# Patient Record
Sex: Female | Born: 1975 | Race: White | Hispanic: No | Marital: Married | Smoking: Never smoker
Health system: Southern US, Community
[De-identification: ages and names within clinical notes are randomized; demographics above are authoritative.]

## PROBLEM LIST (undated history)

## (undated) DIAGNOSIS — R51 Headache: Secondary | ICD-10-CM

## (undated) DIAGNOSIS — F419 Anxiety disorder, unspecified: Secondary | ICD-10-CM

## (undated) DIAGNOSIS — C44310 Basal cell carcinoma of skin of unspecified parts of face: Secondary | ICD-10-CM

## (undated) DIAGNOSIS — G510 Bell's palsy: Secondary | ICD-10-CM

## (undated) DIAGNOSIS — F32A Depression, unspecified: Secondary | ICD-10-CM

## (undated) DIAGNOSIS — Z8619 Personal history of other infectious and parasitic diseases: Secondary | ICD-10-CM

## (undated) DIAGNOSIS — F329 Major depressive disorder, single episode, unspecified: Secondary | ICD-10-CM

## (undated) DIAGNOSIS — F319 Bipolar disorder, unspecified: Secondary | ICD-10-CM

## (undated) DIAGNOSIS — R519 Headache, unspecified: Secondary | ICD-10-CM

## (undated) HISTORY — DX: Major depressive disorder, single episode, unspecified: F32.9

## (undated) HISTORY — PX: OTHER SURGICAL HISTORY: SHX169

## (undated) HISTORY — DX: Personal history of other infectious and parasitic diseases: Z86.19

## (undated) HISTORY — PX: TONSILLECTOMY AND ADENOIDECTOMY: SUR1326

## (undated) HISTORY — PX: MOHS SURGERY: SUR867

## (undated) HISTORY — DX: Basal cell carcinoma of skin of unspecified parts of face: C44.310

## (undated) HISTORY — PX: TUBAL LIGATION: SHX77

## (undated) HISTORY — DX: Depression, unspecified: F32.A

---

## 2005-07-06 ENCOUNTER — Ambulatory Visit: Payer: Self-pay | Admitting: Internal Medicine

## 2006-04-08 ENCOUNTER — Inpatient Hospital Stay: Payer: Self-pay

## 2009-10-15 ENCOUNTER — Ambulatory Visit: Payer: Self-pay | Admitting: Licensed Clinical Social Worker

## 2012-01-31 HISTORY — PX: BREAST CYST ASPIRATION: SHX578

## 2012-04-09 ENCOUNTER — Ambulatory Visit: Payer: Self-pay | Admitting: Obstetrics and Gynecology

## 2012-11-13 ENCOUNTER — Ambulatory Visit
Admission: RE | Admit: 2012-11-13 | Discharge: 2012-11-13 | Disposition: A | Discharge status: Home / Self Care | Payer: BC Managed Care – PPO | Source: Ambulatory Visit | Attending: Nurse Practitioner | Admitting: Nurse Practitioner

## 2012-11-13 ENCOUNTER — Other Ambulatory Visit: Payer: Self-pay | Admitting: Nurse Practitioner

## 2012-11-13 DIAGNOSIS — E041 Nontoxic single thyroid nodule: Secondary | ICD-10-CM

## 2013-04-18 ENCOUNTER — Ambulatory Visit: Payer: Self-pay | Admitting: Surgery

## 2014-04-28 DIAGNOSIS — D249 Benign neoplasm of unspecified breast: Secondary | ICD-10-CM | POA: Insufficient documentation

## 2014-05-11 ENCOUNTER — Encounter: Payer: Self-pay | Admitting: Podiatry

## 2014-05-11 ENCOUNTER — Ambulatory Visit: Payer: Self-pay | Admitting: Podiatry

## 2014-05-11 ENCOUNTER — Ambulatory Visit: Payer: Self-pay

## 2014-05-11 ENCOUNTER — Ambulatory Visit (INDEPENDENT_AMBULATORY_CARE_PROVIDER_SITE_OTHER): Payer: BC Managed Care – PPO | Admitting: Podiatry

## 2014-05-11 VITALS — BP 109/77 | HR 84 | Resp 16

## 2014-05-11 DIAGNOSIS — F32A Depression, unspecified: Secondary | ICD-10-CM | POA: Insufficient documentation

## 2014-05-11 DIAGNOSIS — M722 Plantar fascial fibromatosis: Secondary | ICD-10-CM | POA: Diagnosis not present

## 2014-05-11 DIAGNOSIS — F329 Major depressive disorder, single episode, unspecified: Secondary | ICD-10-CM | POA: Insufficient documentation

## 2014-05-11 DIAGNOSIS — C4490 Unspecified malignant neoplasm of skin, unspecified: Secondary | ICD-10-CM | POA: Insufficient documentation

## 2014-05-11 DIAGNOSIS — E669 Obesity, unspecified: Secondary | ICD-10-CM | POA: Insufficient documentation

## 2014-05-11 DIAGNOSIS — F17200 Nicotine dependence, unspecified, uncomplicated: Secondary | ICD-10-CM | POA: Insufficient documentation

## 2014-05-11 DIAGNOSIS — G4700 Insomnia, unspecified: Secondary | ICD-10-CM | POA: Insufficient documentation

## 2014-05-11 MED ORDER — TRIAMCINOLONE ACETONIDE 10 MG/ML IJ SUSP: 10.0000 mg | Freq: Once | INTRAMUSCULAR | Status: DC

## 2014-05-11 MED ORDER — MELOXICAM 7.5 MG PO TABS: 7.5000 mg | ORAL_TABLET | Freq: Every day | ORAL | Status: DC

## 2014-05-11 NOTE — Patient Instructions (Signed)
Plantar Fasciitis (Heel Spur Syndrome) with Rehab The plantar fascia is a fibrous, ligament-like, soft-tissue structure that spans the bottom of the foot. Plantar fasciitis is a condition that causes pain in the foot due to inflammation of the tissue. SYMPTOMS   Pain and tenderness on the underneath side of the foot.  Pain that worsens with standing or walking. CAUSES  Plantar fasciitis is caused by irritation and injury to the plantar fascia on the underneath side of the foot. Common mechanisms of injury include:  Direct trauma to bottom of the foot.  Damage to a small nerve that runs under the foot where the main fascia attaches to the heel bone.  Stress placed on the plantar fascia due to bone spurs. RISK INCREASES WITH:   Activities that place stress on the plantar fascia (running, jumping, pivoting, or cutting).  Poor strength and flexibility.  Improperly fitted shoes.  Tight calf muscles.  Flat feet.  Failure to warm-up properly before activity.  Obesity. PREVENTION  Warm up and stretch properly before activity.  Allow for adequate recovery between workouts.  Maintain physical fitness:  Strength, flexibility, and endurance.  Cardiovascular fitness.  Maintain a health body weight.  Avoid stress on the plantar fascia.  Wear properly fitted shoes, including arch supports for individuals who have flat feet. PROGNOSIS  If treated properly, then the symptoms of plantar fasciitis usually resolve without surgery. However, occasionally surgery is necessary. RELATED COMPLICATIONS   Recurrent symptoms that may result in a chronic condition.  Problems of the lower back that are caused by compensating for the injury, such as limping.  Pain or weakness of the foot during push-off following surgery.  Chronic inflammation, scarring, and partial or complete fascia tear, occurring more often from repeated injections. TREATMENT  Treatment initially involves the use of  ice and medication to help reduce pain and inflammation. The use of strengthening and stretching exercises may help reduce pain with activity, especially stretches of the Achilles tendon. These exercises may be performed at home or with a therapist. Your caregiver may recommend that you use heel cups of arch supports to help reduce stress on the plantar fascia. Occasionally, corticosteroid injections are given to reduce inflammation. If symptoms persist for greater than 6 months despite non-surgical (conservative), then surgery may be recommended.  MEDICATION   If pain medication is necessary, then nonsteroidal anti-inflammatory medications, such as aspirin and ibuprofen, or other minor pain relievers, such as acetaminophen, are often recommended.  Do not take pain medication within 7 days before surgery.  Prescription pain relievers may be given if deemed necessary by your caregiver. Use only as directed and only as much as you need.  Corticosteroid injections may be given by your caregiver. These injections should be reserved for the most serious cases, because they may only be given a certain number of times. HEAT AND COLD  Cold treatment (icing) relieves pain and reduces inflammation. Cold treatment should be applied for 10 to 15 minutes every 2 to 3 hours for inflammation and pain and immediately after any activity that aggravates your symptoms. Use ice packs or massage the area with a piece of ice (ice massage).  Heat treatment may be used prior to performing the stretching and strengthening activities prescribed by your caregiver, physical therapist, or athletic trainer. Use a heat pack or soak the injury in warm water. SEEK IMMEDIATE MEDICAL CARE IF:  Treatment seems to offer no benefit, or the condition worsens.  Any medications produce adverse side effects. EXERCISES RANGE   OF MOTION (ROM) AND STRETCHING EXERCISES - Plantar Fasciitis (Heel Spur Syndrome) These exercises may help you  when beginning to rehabilitate your injury. Your symptoms may resolve with or without further involvement from your physician, physical therapist or athletic trainer. While completing these exercises, remember:   Restoring tissue flexibility helps normal motion to return to the joints. This allows healthier, less painful movement and activity.  An effective stretch should be held for at least 30 seconds.  A stretch should never be painful. You should only feel a gentle lengthening or release in the stretched tissue. RANGE OF MOTION - Toe Extension, Flexion  Sit with your right / left leg crossed over your opposite knee.  Grasp your toes and gently pull them back toward the top of your foot. You should feel a stretch on the bottom of your toes and/or foot.  Hold this stretch for __________ seconds.  Now, gently pull your toes toward the bottom of your foot. You should feel a stretch on the top of your toes and or foot.  Hold this stretch for __________ seconds. Repeat __________ times. Complete this stretch __________ times per day.  RANGE OF MOTION - Ankle Dorsiflexion, Active Assisted  Remove shoes and sit on a chair that is preferably not on a carpeted surface.  Place right / left foot under knee. Extend your opposite leg for support.  Keeping your heel down, slide your right / left foot back toward the chair until you feel a stretch at your ankle or calf. If you do not feel a stretch, slide your bottom forward to the edge of the chair, while still keeping your heel down.  Hold this stretch for __________ seconds. Repeat __________ times. Complete this stretch __________ times per day.  STRETCH - Gastroc, Standing  Place hands on wall.  Extend right / left leg, keeping the front knee somewhat bent.  Slightly point your toes inward on your back foot.  Keeping your right / left heel on the floor and your knee straight, shift your weight toward the wall, not allowing your back to  arch.  You should feel a gentle stretch in the right / left calf. Hold this position for __________ seconds. Repeat __________ times. Complete this stretch __________ times per day. STRETCH - Soleus, Standing  Place hands on wall.  Extend right / left leg, keeping the other knee somewhat bent.  Slightly point your toes inward on your back foot.  Keep your right / left heel on the floor, bend your back knee, and slightly shift your weight over the back leg so that you feel a gentle stretch deep in your back calf.  Hold this position for __________ seconds. Repeat __________ times. Complete this stretch __________ times per day. STRETCH - Gastrocsoleus, Standing  Note: This exercise can place a lot of stress on your foot and ankle. Please complete this exercise only if specifically instructed by your caregiver.   Place the ball of your right / left foot on a step, keeping your other foot firmly on the same step.  Hold on to the wall or a rail for balance.  Slowly lift your other foot, allowing your body weight to press your heel down over the edge of the step.  You should feel a stretch in your right / left calf.  Hold this position for __________ seconds.  Repeat this exercise with a slight bend in your right / left knee. Repeat __________ times. Complete this stretch __________ times per day.    STRENGTHENING EXERCISES - Plantar Fasciitis (Heel Spur Syndrome)  These exercises may help you when beginning to rehabilitate your injury. They may resolve your symptoms with or without further involvement from your physician, physical therapist or athletic trainer. While completing these exercises, remember:   Muscles can gain both the endurance and the strength needed for everyday activities through controlled exercises.  Complete these exercises as instructed by your physician, physical therapist or athletic trainer. Progress the resistance and repetitions only as guided. STRENGTH -  Towel Curls  Sit in a chair positioned on a non-carpeted surface.  Place your foot on a towel, keeping your heel on the floor.  Pull the towel toward your heel by only curling your toes. Keep your heel on the floor.  If instructed by your physician, physical therapist or athletic trainer, add ____________________ at the end of the towel. Repeat __________ times. Complete this exercise __________ times per day. STRENGTH - Ankle Inversion  Secure one end of a rubber exercise band/tubing to a fixed object (table, pole). Loop the other end around your foot just before your toes.  Place your fists between your knees. This will focus your strengthening at your ankle.  Slowly, pull your big toe up and in, making sure the band/tubing is positioned to resist the entire motion.  Hold this position for __________ seconds.  Have your muscles resist the band/tubing as it slowly pulls your foot back to the starting position. Repeat __________ times. Complete this exercises __________ times per day.  Document Released: 01/16/2005 Document Revised: 04/10/2011 Document Reviewed: 04/30/2008 [employer] Patient Information 2015 [employer]. This information is not intended to replace advice given to you by your health care provider. Make sure you discuss any questions you have with your health care provider.  

## 2014-05-12 NOTE — Progress Notes (Signed)
Subjective:     Patient ID: Brittany Davila, female   DOB: 10-Apr-1975, 39 y.o.   MRN: [id number]  HPI 39 year old female presents the office they with complaints of left arch and heel pain which has been ongoing for several months. She denies any history of injury or trauma to the area or any change or increase in activity the time of onset of symptoms. She denies any numbness or tingling. Denies any swelling or redness overlying the area. She states that she has pain in the morning or after periods of rest. She said no prior treatment. No other complaints at this time.  Review of Systems  All other systems reviewed and are negative.      Objective:   Physical Exam AAO x3, NAD DP/PT pulses palpable bilaterally, CRT less than 3 seconds Protective sensation intact with Simms Weinstein monofilament, vibratory sensation intact, Achilles tendon reflex intact Tenderness to palpation overlying the plantar medial tubercle of the calcaneus to left heel at the insertion of the plantar fascia. There is mild pain along the course of the medial band of the plantar fascia within the arch of the foot. The plantar fascia appears intact. There is no pain with lateral compression of the calcaneus or pain the vibratory sensation. No pain on the posterior aspect of the calcaneus or along the course/insertion of the Achilles tendon. There is no overlying edema, erythema, increase in warmth. No other areas of tenderness palpation or pain with vibratory sensation to the foot/ankle. MMT 5/5, ROM WNL No open lesions or pre-ulcerative lesions are identified. No pain with calf compression, swelling, warmth, erythema.     Assessment:     39 year old female with left foot plantar fasciitis    Plan:     -X-rays were obtained and reviewed with the patient -Treatment options were discussed including alternatives, risks, complications -Patient elects to proceed with steroid injection into the left heel. Under sterile skin  preparation, a total of 2.5cc of kenalog 10, 0.5% Marcaine plain, and 2% lidocaine plain were infiltrated into the symptomatic area without complication. A band-aid was applied. Patient tolerated the injection well without complication. Post-injection care with discussed with the patient. Discussed with the patient to ice the area over the next couple of days to help prevent a steroid flare.  -Prescribed meloxicam. Discussed side effects and directed to stop if any are to occur call the office -Dispensed plantar fascial brace -Ice to the area -Discussed stretching exercises -Discussed shoe gear modifications and possible orthotics. -Follow-up in 3 weeks or sooner if any problems are to arise. In the meantime encouraged to call the office with any questions or concerns or any change in symptoms.

## 2014-06-04 ENCOUNTER — Encounter: Payer: Self-pay | Admitting: Podiatry

## 2014-06-04 ENCOUNTER — Ambulatory Visit (INDEPENDENT_AMBULATORY_CARE_PROVIDER_SITE_OTHER): Payer: BC Managed Care – PPO | Admitting: Podiatry

## 2014-06-04 VITALS — Ht 69.0 in | Wt 245.0 lb

## 2014-06-04 DIAGNOSIS — M722 Plantar fascial fibromatosis: Secondary | ICD-10-CM

## 2014-06-05 NOTE — Progress Notes (Signed)
Patient ID: Brittany Davila, female   DOB: 1975/09/14, 39 y.o.   MRN: [id number]  Subjective: 39 year old female presents the office they for follow-up evaluation of left arch and heel pain. She states that the injection is also limited help significantly her pain is decreased the left heel. She is also continue with icing and stretching activities as well as when the plantar fascial brace. 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 She does not have the sensation on a regular basis or with regular activities only after standing for long periods of time. Denies any history of injury or trauma. No other complaints at this time.  Objective: AAO x3, NAD DP/PT pulses palpable bilaterally, CRT less than 3 seconds Protective sensation intact with Simms Weinstein monofilament, vibratory sensation intact, Achilles tendon reflex intact; negative tinel sign  There is decreased tenderness to palpation overlying the plantar medial tubercle of the calcaneus to left heel at the insertion of the plantar fascia. There is no pain along the course of plantar fascial within the arch of the foot and the plantar fascia appears intact. There is no pain with lateral compression of the calcaneus or pain the vibratory sensation. No pain on the posterior aspect of the calcaneus or along the course/insertion of the Achilles tendon. There is no overlying edema, erythema, increase in warmth. No other areas of tenderness palpation or pain with vibratory sensation to the foot/ankle. MMT 5/5, ROM WNL No open lesions or pre-ulcerative lesions are identified. No pain with calf compression, swelling, warmth, erythema.  Assessment: 39 year old female with resolving left heel pain, plantar fasciitis  Plan: -Treatment options were discussed including alternatives, risks, complications. -Patient wishes to hold off on another steroid  injection at this time. -I discussed with patient I do believe that she would benefit from orthotics to help support her foot type. I believe this would help her while working to help alleviate the symptoms. I discussed custom and over-the-counter orthotics. She'll purchase an over-the-counter orthotics and I talked her about what to look for in purchasing them. -Continue the icing and stretching activities -Continue plantar fascial brace -Continue night splint -Discussed shoe gear modifications and not to go barefoot even while at home. She presents today wearing a flat sandal. She does like to wear sandals and I discussed the various brands of sandals that have good arch support.  -Follow-up in 4 weeks' if the symptoms are not completely resolved or sooner if any problems are to arise. In the meantime call the office if any questions, concerns, change in symptoms.

## 2014-09-16 ENCOUNTER — Encounter: Payer: Self-pay | Admitting: Podiatry

## 2014-09-16 ENCOUNTER — Ambulatory Visit (INDEPENDENT_AMBULATORY_CARE_PROVIDER_SITE_OTHER): Payer: BC Managed Care – PPO

## 2014-09-16 ENCOUNTER — Ambulatory Visit (INDEPENDENT_AMBULATORY_CARE_PROVIDER_SITE_OTHER): Payer: BC Managed Care – PPO | Admitting: Podiatry

## 2014-09-16 DIAGNOSIS — S99921A Unspecified injury of right foot, initial encounter: Secondary | ICD-10-CM

## 2014-09-16 DIAGNOSIS — S9031XA Contusion of right foot, initial encounter: Secondary | ICD-10-CM

## 2014-09-16 MED ORDER — MELOXICAM 15 MG PO TABS: 15.0000 mg | ORAL_TABLET | Freq: Every day | ORAL | Status: DC

## 2014-09-16 NOTE — Progress Notes (Signed)
She presents today with a chief complaint of a painful injury to the right foot following a water slide over the weekend. She states is very sore to walk on it has improved a little bit.  Objective: Vital signs are stable alert and oriented 3. Pulses are strongly palpable. Neurologic sensorium is intact versus once the monofilament. Deep tendon reflexes are intact. Muscle strength is 5 over 5 dorsiflexes plantar flexors and inverters everters all intrinsic musculature is intact. She has pain on direct palpation of the navicular tuberosity and all other or the PD evaluations appear to be relatively normal. Radiographs taken in the office today do not demonstrate any type of osseus abnormalities.  Assessment: Contusion navicular tuberosity right foot.  Plan: Discussed etiology pathology conservative versus surgical therapies discussed R ICE therapy and will follow-up with her as needed. Also instructed her to wear her Cam Gilford Rile that she has for the other foot on the right foot.  Dr. Roselind Messier

## 2014-10-07 ENCOUNTER — Ambulatory Visit: Payer: BC Managed Care – PPO | Admitting: Podiatry

## 2016-03-09 ENCOUNTER — Emergency Department (HOSPITAL_COMMUNITY)
Admission: EM | Admit: 2016-03-09 | Discharge: 2016-03-09 | Disposition: A | Discharge status: Home / Self Care | Payer: BC Managed Care – PPO | Attending: Emergency Medicine | Admitting: Emergency Medicine

## 2016-03-09 ENCOUNTER — Encounter (HOSPITAL_COMMUNITY): Payer: Self-pay | Admitting: Emergency Medicine

## 2016-03-09 DIAGNOSIS — Z79899 Other long term (current) drug therapy: Secondary | ICD-10-CM | POA: Diagnosis not present

## 2016-03-09 DIAGNOSIS — G4700 Insomnia, unspecified: Secondary | ICD-10-CM | POA: Diagnosis not present

## 2016-03-09 DIAGNOSIS — F418 Other specified anxiety disorders: Secondary | ICD-10-CM | POA: Diagnosis present

## 2016-03-09 HISTORY — DX: Anxiety disorder, unspecified: F41.9

## 2016-03-09 HISTORY — DX: Bipolar disorder, unspecified: F31.9

## 2016-03-09 LAB — CBC
HEMATOCRIT: 40.8 % (ref 36.0–46.0)
HEMOGLOBIN: 13.9 g/dL (ref 12.0–15.0)
MCH: 30.2 pg (ref 26.0–34.0)
MCHC: 34.1 g/dL (ref 30.0–36.0)
MCV: 88.7 fL (ref 78.0–100.0)
Platelets: 326 10*3/uL (ref 150–400)
RBC: 4.6 MIL/uL (ref 3.87–5.11)
RDW: 12.8 % (ref 11.5–15.5)
WBC: 9.2 10*3/uL (ref 4.0–10.5)

## 2016-03-09 LAB — COMPREHENSIVE METABOLIC PANEL
ALBUMIN: 4.5 g/dL (ref 3.5–5.0)
ALK PHOS: 65 U/L (ref 38–126)
ALT: 13 U/L — AB (ref 14–54)
AST: 16 U/L (ref 15–41)
Anion gap: 7 (ref 5–15)
BUN: 10 mg/dL (ref 6–20)
CALCIUM: 9.2 mg/dL (ref 8.9–10.3)
CO2: 26 mmol/L (ref 22–32)
CREATININE: 0.95 mg/dL (ref 0.44–1.00)
Chloride: 106 mmol/L (ref 101–111)
GFR calc Af Amer: >60 mL/min (ref >60)
GFR calc non Af Amer: >60 mL/min (ref >60)
GLUCOSE: 97 mg/dL (ref 65–99)
Potassium: 4 mmol/L (ref 3.5–5.1)
SODIUM: 139 mmol/L (ref 135–145)
Total Bilirubin: 0.1 mg/dL — ABNORMAL LOW (ref 0.3–1.2)
Total Protein: 7.3 g/dL (ref 6.5–8.1)

## 2016-03-09 LAB — RAPID URINE DRUG SCREEN, HOSP PERFORMED
Amphetamines: NOT DETECTED
BARBITURATES: NOT DETECTED
Benzodiazepines: POSITIVE — AB
COCAINE: NOT DETECTED
Opiates: NOT DETECTED
TETRAHYDROCANNABINOL: NOT DETECTED

## 2016-03-09 LAB — ETHANOL: Alcohol, Ethyl (B): <5 mg/dL (ref <5)

## 2016-03-09 LAB — ACETAMINOPHEN LEVEL: Acetaminophen (Tylenol), Serum: <10 ug/mL — ABNORMAL LOW (ref 10–30)

## 2016-03-09 LAB — SALICYLATE LEVEL: Salicylate Lvl: <7 mg/dL (ref 2.8–30.0)

## 2016-03-09 MED ORDER — TRAZODONE HCL 100 MG PO TABS: 100.0000 mg | ORAL_TABLET | Freq: Every day | ORAL | 0 refills | Status: DC

## 2016-03-09 NOTE — ED Notes (Signed)
Patient's mother at bedside.

## 2016-03-09 NOTE — ED Provider Notes (Signed)
[facility] DEPT Provider Note   CSN: [id number] Arrival date & time: 03/09/16  1035     History   Chief Complaint Chief Complaint  Patient presents with  . Anxiety Depression    HPI Brittany Davila is a 41 y.o. female.  HPI:  And is 89. She has a long-standing history of anxiety depression and bipolar disorder. She follows with a psychiatrist and his nurse practitioner.  He states that this is the third psychiatrist she's had the last 4 years because one retired, then one moved away. She's been with her current one for 6 months.  Her main complaint is insomnia. She states over the last year she's been tried on multiple medications. She was on Ambien which worked for a while then stopped. Same effect with Ambien CR, Lunesta, and all over-the-counter medications.  She was placed on trazodone about a month ago. She took a 100 mg dose and it did not work. She took 200 mg. She states several hours later she was on the floor. She get a verbal happened. She stated her husband had trouble waking her up. She was concerned about this and stopped taking it.  Current medications include Lamictal, Latuda, and BuSpar. She uses Mirena IUD. Xanax 0.5 twice a day when necessary for anxiety.  She states that she became just frustrated last night because she could not sleep and took 4 of her Xanax 1 mg tablets. She called her nurse practitioner at her psychiatrist office today and reported this. She states that she was directed here because they were "concerned". Patient denies that she is suicidal. She admits frustration. Patient's mother is here with her and supportive. Both denied that she is suicidal or has any plan or intent of self-harm right now.      Past Medical History:  Diagnosis Date  . Anxiety   . Bipolar 1 disorder ([place])   . Depression     Patient Active Problem List   Diagnosis Date Noted  . Clinical depression 05/11/2014  . Cannot sleep 05/11/2014  . Adiposity 05/11/2014  .  CA of skin 05/11/2014  . 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 BREAST CYST ASPIRATION      OB History    No data available       Home Medications    Prior to Admission medications   Medication Sig Start Date End Date Taking? Authorizing Provider  ALPRAZolam (XANAX) 1 MG tablet TAKE 1/2 -1 TABLET BY MOUTH TWICE A DAY. 07/15/14  Yes Historical Provider, MD  busPIRone (BUSPAR) 10 MG tablet Take 10 mg by mouth at bedtime.   Yes Historical Provider, MD  lamoTRIgine (LAMICTAL) 200 MG tablet Take 200 mg by mouth 2 (two) times daily.    Yes Historical Provider, MD  levonorgestrel (MIRENA) 20 MCG/24HR IUD 1 each by Intrauterine route once.   Yes Historical Provider, MD  lurasidone (LATUDA) 80 MG TABS tablet Take 40 mg by mouth 2 (two) times daily.   Yes Historical Provider, MD  meloxicam (MOBIC) 15 MG tablet Take 1 tablet (15 mg total) by mouth daily. Patient not taking: Reported on 03/09/2016 09/16/14   Max T Hyatt, DPM  meloxicam (MOBIC) 7.5 MG tablet Take 1 tablet (7.5 mg total) by mouth daily. Patient not taking: Reported on 03/09/2016 05/11/14   Trula Slade, DPM  naproxen (NAPROSYN) 500 MG tablet TAKE 1 TABLET (500 MG TOTAL) BY MOUTH 2 (  TWO) TIMES DAILY AS NEEDED. 08/21/14   Historical Provider, MD  traZODone (DESYREL) 100 MG tablet Take 1 tablet (100 mg total) by mouth at bedtime. 03/09/16   Tanna Furry, MD  zolpidem (AMBIEN CR) 12.5 MG CR tablet Take 12.5 mg by mouth at bedtime. 08/31/14   Historical Provider, MD    Family History No family history on file.  Social History Social History  Substance Use Topics  . Smoking status: Never Smoker  . Smokeless tobacco: Never Used  . Alcohol use No     Allergies   Patient has no known allergies.   Review of Systems Review of Systems  Constitutional: Negative for appetite change, chills, diaphoresis, fatigue and fever.  HENT: Negative  for mouth sores, sore throat and trouble swallowing.   Eyes: Negative for visual disturbance.  Respiratory: Negative for cough, chest tightness, shortness of breath and wheezing.   Cardiovascular: Negative for chest pain.  Gastrointestinal: Negative for abdominal distention, abdominal pain, diarrhea, nausea and vomiting.  Endocrine: Negative for polydipsia, polyphagia and polyuria.  Genitourinary: Negative for dysuria, frequency and hematuria.  Musculoskeletal: Negative for gait problem.  Skin: Negative for color change, pallor and rash.  Neurological: Negative for dizziness, syncope, light-headedness and headaches.  Hematological: Does not bruise/bleed easily.  Psychiatric/Behavioral: Positive for dysphoric mood and sleep disturbance. Negative for behavioral problems and confusion.     Physical Exam Updated Vital Signs BP 124/81 (BP Location: Right Arm)   Pulse 87   Temp 98.1 F (36.7 C)   Resp 20   Ht 5\' 10"  (1.778 m)   Wt 216 lb (98 kg)   SpO2 99%   BMI 30.99 kg/m   Physical Exam  Constitutional: She is oriented to person, place, and time. She appears well-developed and well-nourished. No distress.  HENT:  Head: Normocephalic.  Eyes: Conjunctivae are normal. Pupils are equal, round, and reactive to light. No scleral icterus.  Neck: Normal range of motion. Neck supple. No thyromegaly present.  Cardiovascular: Normal rate and regular rhythm.  Exam reveals no gallop and no friction rub.   No murmur heard. Pulmonary/Chest: Effort normal and breath sounds normal. No respiratory distress. She has no wheezes. She has no rales.  Abdominal: Soft. Bowel sounds are normal. She exhibits no distension. There is no tenderness. There is no rebound.  Musculoskeletal: Normal range of motion.  Neurological: She is alert and oriented to person, place, and time.  Skin: Skin is warm and dry. No rash noted.  Psychiatric: She has a normal mood and affect. Her behavior is normal.  Normal  interaction. Patient is awake alert. Pleasant and interactive. Answers questions appropriately. Denies any suicidal intent with her ingestion last night or any suicidal thoughts or intent currently.     ED Treatments / Results  Labs (all labs ordered are listed, but only abnormal results are displayed) Labs Reviewed  COMPREHENSIVE METABOLIC PANEL - Abnormal; Notable for the following:       Result Value   ALT 13 (*)    Total Bilirubin 0.1 (*)    All other components within normal limits  ACETAMINOPHEN LEVEL - Abnormal; Notable for the following:    Acetaminophen (Tylenol), Serum <10 (*)    All other components within normal limits  RAPID URINE DRUG SCREEN, HOSP PERFORMED - Abnormal; Notable for the following:    Benzodiazepines POSITIVE (*)    All other components within normal limits  ETHANOL  SALICYLATE LEVEL  CBC    EKG  EKG Interpretation None  Radiology No results found.  Procedures Procedures (including critical care time)  Medications Ordered in ED Medications - No data to display   Initial Impression / Assessment and Plan / ED Course  I have reviewed the triage vital signs and the nursing notes.  Pertinent labs & imaging results that were available during my care of the patient were reviewed by me and considered in my medical decision making (see chart for details).     I discussed the case with the in PE for psychiatry on call for [place] today. She agreed with my plan of having the patient reinstitute 100 mg of trazodone at night. Thus the patient is started with trouble getting into bed, turning off all lites and TV and music and stimuli. After 5-7 days if not getting rest with a 100 mg dose may take a second dose of 50 mg at 30 minutes. Vascular to follow-up with her psychiatrist and nurse practitioner within the next 7 days.  Told her return here with any change in thoughts or behavior or any suicidal thoughts or plans. She and mother both are able to  very easily contract for safety. I feel comfortable with this patient being discharged with the above plan.  Final Clinical Impressions(s) / ED Diagnoses   Final diagnoses:  Insomnia, unspecified type    New Prescriptions New Prescriptions   TRAZODONE (DESYREL) 100 MG TABLET    Take 1 tablet (100 mg total) by mouth at bedtime.     Tanna Furry, MD 03/09/16 1350

## 2016-03-09 NOTE — Discharge Instructions (Signed)
Follow up with your Psychiatrist/Nurse Practitioner.  100 mg trazodone at night.   Start a "Ritual" at bedtime. In bed,lights/TV/Music off.  After 5-7 days, if Trazodone is not working, you may repeat 50mg  after 30 minutes.

## 2016-03-09 NOTE — ED Triage Notes (Signed)
Patient from home reports a history of anxiety and bipolar.  She has a history of insomnia and the xanax she is prescribed was not helping her sleep so she took 4.  She normally needs to take 2.  She sees a Designer, jewellery at [facility], [place] who suggested she come in.  Patient has suicidal ideation but no plan.  Denies HI.

## 2016-05-12 ENCOUNTER — Other Ambulatory Visit: Payer: Self-pay | Admitting: Surgery

## 2016-05-12 DIAGNOSIS — D242 Benign neoplasm of left breast: Secondary | ICD-10-CM

## 2016-05-15 ENCOUNTER — Encounter: Payer: Self-pay | Admitting: Internal Medicine

## 2016-05-15 ENCOUNTER — Ambulatory Visit (INDEPENDENT_AMBULATORY_CARE_PROVIDER_SITE_OTHER): Payer: BC Managed Care – PPO | Admitting: Internal Medicine

## 2016-05-15 DIAGNOSIS — F419 Anxiety disorder, unspecified: Secondary | ICD-10-CM | POA: Diagnosis not present

## 2016-05-15 DIAGNOSIS — F319 Bipolar disorder, unspecified: Secondary | ICD-10-CM | POA: Diagnosis not present

## 2016-05-15 DIAGNOSIS — C44310 Basal cell carcinoma of skin of unspecified parts of face: Secondary | ICD-10-CM | POA: Insufficient documentation

## 2016-05-15 DIAGNOSIS — F5104 Psychophysiologic insomnia: Secondary | ICD-10-CM

## 2016-05-15 DIAGNOSIS — F329 Major depressive disorder, single episode, unspecified: Secondary | ICD-10-CM

## 2016-05-15 DIAGNOSIS — G4700 Insomnia, unspecified: Secondary | ICD-10-CM | POA: Insufficient documentation

## 2016-05-15 NOTE — Assessment & Plan Note (Signed)
She will continue Buspar and Xanax She will continue to follow with psychiatry

## 2016-05-15 NOTE — Progress Notes (Signed)
HPI  Pt presents to the clinic today to establish care and for management of the conditions listed below. She has not had a PCP in many years.  Anxiety and Depression: She feels like this is triggered by financial struggles and feelings of inadequacy. She is taking Buspar and Xanax as prescribed. She feels like her symptoms are well controlled.   Bipolar 1 Disorder: She is taking Lamictal and Latuda as prescribed. She follows with Dr. Rosilyn Mings in [place]. She feels like her symptoms are well controlled.  Basal Cell Carcinoma of Face (4 times): s/p excision 4-5 years ago. She follows with dermatology yearly, Dr. Ledell Peoples office.  Insomnia: She reports she is getting 4-5 hours of sleep at night. She has trouble staying asleep even with the Trazadone.  Flu:  10/2015 Tetanus: > 10 years ago Pap Smear: 04/2016- abnormal, repeat in 1 year Dentist: biannually  Past Medical History:  Diagnosis Date  . Anxiety   . Basal cell carcinoma (BCC) of face   . Bipolar 1 disorder ([place])   . Depression   . History of genital warts     Current Outpatient Prescriptions  Medication Sig Dispense Refill  . ALPRAZolam (XANAX) 1 MG tablet TAKE 1/2 -1 TABLET BY MOUTH TWICE A DAY.  0  . busPIRone (BUSPAR) 10 MG tablet Take 10 mg by mouth at bedtime.    . lamoTRIgine (LAMICTAL) 200 MG tablet Take 200 mg by mouth 2 (two) times daily.     . Lurasidone HCl (LATUDA) 60 MG TABS Take 1 tablet by mouth 2 (two) times daily.    . traZODone (DESYREL) 100 MG tablet Take 1 tablet (100 mg total) by mouth at bedtime. (Patient taking differently: Take 200 mg by mouth at bedtime. ) 45 tablet 0   Current Facility-Administered Medications  Medication Dose Route Frequency Provider Last Rate Last Dose  . triamcinolone acetonide (KENALOG) 10 MG/ML injection 10 mg  10 mg Other Once Trula Slade, DPM        No Known Allergies  Family History  Problem Relation Age of Onset  . Diabetes Maternal Grandfather   . Breast cancer  Paternal Grandmother   . Colon cancer Paternal Grandfather   . Pancreatic cancer Paternal Grandfather   . Lung cancer Maternal Aunt     Social History   Social History  . Marital status: Married    Spouse name: N/A  . Number of children: N/A  . Years of education: N/A   Occupational History  . Not on file.   Social History Main Topics  . Smoking status: Never Smoker  . Smokeless tobacco: Never Used  . Alcohol use 0.0 oz/week     Comment: occasional  . Drug use: No  . Sexual activity: No   Other Topics Concern  . Not on file   Social History Narrative  . No narrative on file    ROS:  Constitutional: Denies fever, malaise, fatigue, headache or abrupt weight changes.  Respiratory: Denies difficulty breathing, shortness of breath, cough or sputum production.   Cardiovascular: Denies chest pain, chest tightness, palpitations or swelling in the hands or feet.  Skin: Denies redness, rashes, lesions or ulcercations.  Neurological: Denies dizziness, difficulty with memory, difficulty with speech or problems with balance and coordination.  Psych: Pt has history of anxiety and depression. Denies SI/HI.  No other specific complaints in a complete review of systems (except as listed in HPI above).  PE:  BP 122/74 (BP Location: Right Arm, Patient Position: Sitting,  Cuff Size: Large)   Pulse 82   Temp 98.4 F (36.9 C) (Oral)   Ht 5\' 8"  (1.727 m)   Wt 218 lb 12 oz (99.2 kg)   SpO2 98%   BMI 33.26 kg/m  Wt Readings from Last 3 Encounters:  05/15/16 218 lb 12 oz (99.2 kg)  03/09/16 216 lb (98 kg)  06/04/14 245 lb (111.1 kg)    General: Appears her stated age, obese in NAD. Skin: Dry and intact. Cardiovascular: Normal rate and rhythm. Pulmonary/Chest: Normal effort and positive vesicular breath sounds. No respiratory distress. No wheezes, rales or ronchi noted.  Neurological: Alert and oriented.  Psychiatric: Mood and affect normal. Behavior is normal. Judgment and  thought content normal.     BMET    Component Value Date/Time   NA 139 03/09/2016 1125   K 4.0 03/09/2016 1125   CL 106 03/09/2016 1125   CO2 26 03/09/2016 1125   GLUCOSE 97 03/09/2016 1125   BUN 10 03/09/2016 1125   CREATININE 0.95 03/09/2016 1125   CALCIUM 9.2 03/09/2016 1125   GFRNONAA >60 03/09/2016 1125   GFRAA >60 03/09/2016 1125    Lipid Panel  No results found for: CHOL, TRIG, HDL, CHOLHDL, VLDL, LDLCALC  CBC    Component Value Date/Time   WBC 9.2 03/09/2016 1125   RBC 4.60 03/09/2016 1125   HGB 13.9 03/09/2016 1125   HCT 40.8 03/09/2016 1125   PLT 326 03/09/2016 1125   MCV 88.7 03/09/2016 1125   MCH 30.2 03/09/2016 1125   MCHC 34.1 03/09/2016 1125   RDW 12.8 03/09/2016 1125    Hgb A1C No results found for: HGBA1C   Assessment and Plan:

## 2016-05-15 NOTE — Assessment & Plan Note (Signed)
She will continue to follow with Dr. Ledell Peoples office

## 2016-05-15 NOTE — Patient Instructions (Signed)
Insomnia Insomnia is a sleep disorder that makes it difficult to fall asleep or to stay asleep. Insomnia can cause tiredness (fatigue), low energy, difficulty concentrating, mood swings, and poor performance at work or school. There are three different ways to classify insomnia:  Difficulty falling asleep.  Difficulty staying asleep.  Waking up too early in the morning. Any type of insomnia can be long-term (chronic) or short-term (acute). Both are common. Short-term insomnia usually lasts for three months or less. Chronic insomnia occurs at least three times a week for longer than three months. What are the causes? Insomnia may be caused by another condition, situation, or substance, such as:  Anxiety.  Certain medicines.  Gastroesophageal reflux disease (GERD) or other gastrointestinal conditions.  Asthma or other breathing conditions.  Restless legs syndrome, sleep apnea, or other sleep disorders.  Chronic pain.  Menopause. This may include hot flashes.  Stroke.  Abuse of alcohol, tobacco, or illegal drugs.  Depression.  Caffeine.  Neurological disorders, such as Alzheimer disease.  An overactive thyroid (hyperthyroidism). The cause of insomnia may not be known. What increases the risk? Risk factors for insomnia include:  Gender. Women are more commonly affected than men.  Age. Insomnia is more common as you get older.  Stress. This may involve your professional or personal life.  Income. Insomnia is more common in people with lower income.  Lack of exercise.  Irregular work schedule or night shifts.  Traveling between different time zones. What are the signs or symptoms? If you have insomnia, trouble falling asleep or trouble staying asleep is the main symptom. This may lead to other symptoms, such as:  Feeling fatigued.  Feeling nervous about going to sleep.  Not feeling rested in the morning.  Having trouble concentrating.  Feeling irritable,  anxious, or depressed. How is this treated? Treatment for insomnia depends on the cause. If your insomnia is caused by an underlying condition, treatment will focus on addressing the condition. Treatment may also include:  Medicines to help you sleep.  Counseling or therapy.  Lifestyle adjustments. Follow these instructions at home:  Take medicines only as directed by your health care provider.  Keep regular sleeping and waking hours. Avoid naps.  Keep a sleep diary to help you and your health care provider figure out what could be causing your insomnia. Include:  When you sleep.  When you wake up during the night.  How well you sleep.  How rested you feel the next day.  Any side effects of medicines you are taking.  What you eat and drink.  Make your bedroom a comfortable place where it is easy to fall asleep:  Put up shades or special blackout curtains to block light from outside.  Use a white noise machine to block noise.  Keep the temperature cool.  Exercise regularly as directed by your health care provider. Avoid exercising right before bedtime.  Use relaxation techniques to manage stress. Ask your health care provider to suggest some techniques that may work well for you. These may include:  Breathing exercises.  Routines to release muscle tension.  Visualizing peaceful scenes.  Cut back on alcohol, caffeinated beverages, and cigarettes, especially close to bedtime. These can disrupt your sleep.  Do not overeat or eat spicy foods right before bedtime. This can lead to digestive discomfort that can make it hard for you to sleep.  Limit screen use before bedtime. This includes:  Watching TV.  Using your smartphone, tablet, and computer.  Stick to a   routine. This can help you fall asleep faster. Try to do a quiet activity, brush your teeth, and go to bed at the same time each night.  Get out of bed if you are still awake after 15 minutes of trying to  sleep. Keep the lights down, but try reading or doing a quiet activity. When you feel sleepy, go back to bed.  Make sure that you drive carefully. Avoid driving if you feel very sleepy.  Keep all follow-up appointments as directed by your health care provider. This is important. Contact a health care provider if:  You are tired throughout the day or have trouble in your daily routine due to sleepiness.  You continue to have sleep problems or your sleep problems get worse. Get help right away if:  You have serious thoughts about hurting yourself or someone else. This information is not intended to replace advice given to you by your health care provider. Make sure you discuss any questions you have with your health care provider. Document Released: 01/14/2000 Document Revised: 06/18/2015 Document Reviewed: 10/17/2013 Elsevier Interactive Patient Education  2017 Elsevier Inc.  

## 2016-05-15 NOTE — Assessment & Plan Note (Signed)
She will continue Lomotil and Lamictal She will continue to follow with psychiatry

## 2016-05-15 NOTE — Assessment & Plan Note (Signed)
She will continue Trazadone She will continue to follow with psychiatry

## 2016-05-19 ENCOUNTER — Ambulatory Visit (INDEPENDENT_AMBULATORY_CARE_PROVIDER_SITE_OTHER): Payer: BC Managed Care – PPO | Admitting: Internal Medicine

## 2016-05-19 ENCOUNTER — Encounter: Payer: Self-pay | Admitting: Internal Medicine

## 2016-05-19 VITALS — BP 110/72 | HR 76 | Temp 98.1°F | Wt 220.0 lb

## 2016-05-19 DIAGNOSIS — G4489 Other headache syndrome: Secondary | ICD-10-CM

## 2016-05-19 MED ORDER — BUTALBITAL-APAP-CAFFEINE 50-325-40 MG PO TABS: 1.0000 | ORAL_TABLET | Freq: Four times a day (QID) | ORAL | 0 refills | Status: DC | PRN

## 2016-05-19 NOTE — Patient Instructions (Signed)

## 2016-05-19 NOTE — Progress Notes (Signed)
Subjective:    Patient ID: Brittany Davila, female    DOB: 1975-10-13, 41 y.o.   MRN: [id number]  HPI  Pt presents to the clinic today with c/o headache. She reports this started last night while she was trying to go to sleep. The pain is just above her eyes. She describes the pain as dull. The pain does not radiate. She has some sensitivity to light and nausea but denies sensitivty to sound. She denies visual changes, dizziness or vomiting. She has tried Ibuprofen, Excedrin, Aleve, and has been laying in a dark room with some relief. She denies trauma to the head, frequent headaches or prior history of migraines. She denies sinus symptoms.   Review of Systems  Past Medical History:  Diagnosis Date  . Anxiety   . Basal cell carcinoma (BCC) of face   . Bipolar 1 disorder ([place])   . Depression   . History of genital warts     Current Outpatient Prescriptions  Medication Sig Dispense Refill  . ALPRAZolam (XANAX) 1 MG tablet TAKE 1/2 -1 TABLET BY MOUTH TWICE A DAY.  0  . busPIRone (BUSPAR) 10 MG tablet Take 10 mg by mouth at bedtime.    . lamoTRIgine (LAMICTAL) 200 MG tablet Take 200 mg by mouth 2 (two) times daily.     . Lurasidone HCl (LATUDA) 60 MG TABS Take 1 tablet by mouth 2 (two) times daily.    . traZODone (DESYREL) 100 MG tablet Take 1 tablet (100 mg total) by mouth at bedtime. (Patient taking differently: Take 200 mg by mouth at bedtime. ) 45 tablet 0   No current facility-administered medications for this visit.     No Known Allergies  Family History  Problem Relation Age of Onset  . Diabetes Maternal Grandfather   . Breast cancer Paternal Grandmother   . Colon cancer Paternal Grandfather   . Pancreatic cancer Paternal Grandfather   . Lung cancer Maternal Aunt     Social History   Social History  . Marital status: Married    Spouse name: N/A  . Number of children: N/A  . Years of education: N/A   Occupational History  . Not on file.   Social History Main Topics   . Smoking status: Never Smoker  . Smokeless tobacco: Never Used  . Alcohol use 0.0 oz/week     Comment: occasional  . Drug use: No  . Sexual activity: No   Other Topics Concern  . Not on file   Social History Narrative  . No narrative on file     Constitutional: Pt reports headache. Denies fever, malaise, fatigue, or abrupt weight changes.  HEENT: Denies eye pain, eye redness, ear pain, ringing in the ears, wax buildup, runny nose, nasal congestion, bloody nose, or sore throat. Gastrointestinal: pt reports nausea. Denies abdominal pain, bloating, constipation, diarrhea or blood in the stool.  Neurological: Denies dizziness, difficulty with memory, difficulty with speech or problems with balance and coordination.    No other specific complaints in a complete review of systems (except as listed in HPI above).     Objective:   Physical Exam   BP 110/72   Pulse 76   Temp 98.1 F (36.7 C) (Oral)   Wt 220 lb (99.8 kg)   SpO2 97%   BMI 33.45 kg/m  Wt Readings from Last 3 Encounters:  05/19/16 220 lb (99.8 kg)  05/15/16 218 lb 12 oz (99.2 kg)  03/09/16 216 lb (98 kg)  General: Appears her stated age, in NAD.  HEENT: Head: normal shape and size, no sinus tenderness noted; Eyes: PERRLA and EOMs intact, no nystagmus; Ears: Tm's gray and intact, normal light reflex;  Neurological: Alert and oriented. Coordination normal.    BMET    Component Value Date/Time   NA 139 03/09/2016 1125   K 4.0 03/09/2016 1125   CL 106 03/09/2016 1125   CO2 26 03/09/2016 1125   GLUCOSE 97 03/09/2016 1125   BUN 10 03/09/2016 1125   CREATININE 0.95 03/09/2016 1125   CALCIUM 9.2 03/09/2016 1125   GFRNONAA >60 03/09/2016 1125   GFRAA >60 03/09/2016 1125    Lipid Panel  No results found for: CHOL, TRIG, HDL, CHOLHDL, VLDL, LDLCALC  CBC    Component Value Date/Time   WBC 9.2 03/09/2016 1125   RBC 4.60 03/09/2016 1125   HGB 13.9 03/09/2016 1125   HCT 40.8 03/09/2016 1125   PLT 326  03/09/2016 1125   MCV 88.7 03/09/2016 1125   MCH 30.2 03/09/2016 1125   MCHC 34.1 03/09/2016 1125   RDW 12.8 03/09/2016 1125    Hgb A1C No results found for: HGBA1C         Assessment & Plan:   Headache:  She drove herself and has to go pick up her daughter from school Rx for Fioricet provided Advised her to not take Excedrin, Ibuprofen and Aleve within a few hours of each other No red flags noted today  Return precautions discussed Webb Silversmith, NP

## 2016-06-05 ENCOUNTER — Ambulatory Visit
Admission: RE | Admit: 2016-06-05 | Discharge: 2016-06-05 | Disposition: A | Discharge status: Home / Self Care | Payer: BC Managed Care – PPO | Source: Ambulatory Visit | Attending: Surgery | Admitting: Surgery

## 2016-06-05 DIAGNOSIS — D242 Benign neoplasm of left breast: Secondary | ICD-10-CM

## 2016-06-05 DIAGNOSIS — N6321 Unspecified lump in the left breast, upper outer quadrant: Secondary | ICD-10-CM | POA: Insufficient documentation

## 2016-10-30 ENCOUNTER — Ambulatory Visit (INDEPENDENT_AMBULATORY_CARE_PROVIDER_SITE_OTHER): Payer: BC Managed Care – PPO | Admitting: Internal Medicine

## 2016-10-30 ENCOUNTER — Encounter: Payer: Self-pay | Admitting: Internal Medicine

## 2016-10-30 VITALS — BP 120/80 | HR 78 | Temp 98.2°F | Wt 218.0 lb

## 2016-10-30 DIAGNOSIS — R300 Dysuria: Secondary | ICD-10-CM

## 2016-10-30 DIAGNOSIS — R3915 Urgency of urination: Secondary | ICD-10-CM

## 2016-10-30 LAB — POC URINALSYSI DIPSTICK (AUTOMATED)
Bilirubin, UA: NEGATIVE
Blood, UA: NEGATIVE
Glucose, UA: NEGATIVE
Ketones, UA: NEGATIVE
Nitrite, UA: NEGATIVE
Protein, UA: NEGATIVE
Spec Grav, UA: 1.02 (ref 1.010–1.025)
Urobilinogen, UA: 0.2 E.U./dL
pH, UA: 6 (ref 5.0–8.0)

## 2016-10-30 NOTE — Progress Notes (Signed)
HPI  Pt presents to the clinic today with c/o urinary urgency and dysuria. She reports this started 2 days ago. She denies fever, chills, nausea or low back pain. She denies vaginal complaints. She has not tried anything OTC for her symptoms.   Review of Systems  Past Medical History:  Diagnosis Date  . Anxiety   . Basal cell carcinoma (BCC) of face   . Bipolar 1 disorder ([place])   . Depression   . History of genital warts     Family History  Problem Relation Age of Onset  . Diabetes Maternal Grandfather   . Breast cancer Paternal Grandmother   . Colon cancer Paternal Grandfather   . Pancreatic cancer Paternal Grandfather   . Lung cancer Maternal Aunt     Social History   Social History  . Marital status: Married    Spouse name: N/A  . Number of children: N/A  . Years of education: N/A   Occupational History  . Not on file.   Social History Main Topics  . Smoking status: Never Smoker  . Smokeless tobacco: Never Used  . Alcohol use 0.0 oz/week     Comment: occasional  . Drug use: No  . Sexual activity: No   Other Topics Concern  . Not on file   Social History Narrative  . No narrative on file    No Known Allergies   Constitutional: Denies fever, malaise, fatigue, headache or abrupt weight changes.   GU: Pt reports urgency, and pain with urination. Denies burning sensation, blood in urine, odor or discharge. Skin: Denies redness, rashes, lesions or ulcercations.   No other specific complaints in a complete review of systems (except as listed in HPI above).    Objective:   Physical Exam  BP 120/80   Pulse 78   Temp 98.2 F (36.8 C) (Oral)   Wt 218 lb (98.9 kg)   LMP 10/14/2016   SpO2 98%   BMI 33.15 kg/m  Wt Readings from Last 3 Encounters:  10/30/16 218 lb (98.9 kg)  05/19/16 220 lb (99.8 kg)  05/15/16 218 lb 12 oz (99.2 kg)    General: Appears her stated age, well developed, well nourished in NAD. Abdomen: Soft. Normal bowel sounds. No  distention or masses noted.  No CVA tenderness.       Assessment & Plan:   Urgency, Dysuria  Urinalysis: trace leuks Will send urine culture OK to take AZO OTC Drink plenty of fluids  RTC as needed or if symptoms persist. Webb Silversmith, NP

## 2016-10-30 NOTE — Addendum Note (Signed)
Addended by: Lurlean Nanny on: 10/30/2016 04:51 PM   Modules accepted: Orders

## 2016-10-30 NOTE — Patient Instructions (Signed)

## 2016-10-31 LAB — URINE CULTURE
MICRO NUMBER:: 81085686
RESULT: NO GROWTH
SPECIMEN QUALITY: ADEQUATE

## 2016-11-08 ENCOUNTER — Encounter
Admission: RE | Admit: 2016-11-08 | Discharge: 2016-11-08 | Disposition: A | Discharge status: Home / Self Care | Payer: BC Managed Care – PPO | Source: Ambulatory Visit | Attending: Obstetrics and Gynecology | Admitting: Obstetrics and Gynecology

## 2016-11-08 HISTORY — DX: Headache: R51

## 2016-11-08 HISTORY — DX: Headache, unspecified: R51.9

## 2016-11-08 HISTORY — DX: Bell's palsy: G51.0

## 2016-11-08 NOTE — Patient Instructions (Signed)
Your procedure is scheduled on:11/10/16 Report to Day Surgery.[facility]OND FLOOR To find out your arrival time please call [phone] between 1PM - 3PM on 11/09/16  Remember: Instructions that are not followed completely may result in serious medical risk, up to and including death, or upon the discretion of your surgeon and anesthesiologist your surgery may need to be rescheduled.     _X__ 1. Do not eat food after midnight the night before your procedure.                 No gum chewing or hard candies. You may drink clear liquids up to 2 hours                 before you are scheduled to arrive for your surgery- DO not drink clear                 liquids within 2 hours of the start of your surgery.                 Clear Liquids include:  water, apple juice without pulp, clear carbohydrate                 drink such as Clearfast of Gartorade, Black Coffee or Tea (Do not add                 anything to coffee or tea).     _X__ 2.  No Alcohol for 24 hours before or after surgery.   _X__ 3.  Do Not Smoke or use e-cigarettes For 24 Hours Prior to Your Surgery.                 Do not use any chewable tobacco products for at least 6 hours prior to                 surgery.  ____  4.  Bring all medications with you on the day of surgery if instructed.   ___X_  5.  Notify your doctor if there is any change in your medical condition      (cold, fever, infections).     Do not wear jewelry, make-up, hairpins, clips or nail polish. Do not wear lotions, powders, or perfumes. You may wear deodorant. Do not shave 48 hours prior to surgery. Men may shave face and neck. Do not bring valuables to the hospital.    [facility] is not responsible for any belongings or valuables.  Contacts, dentures or bridgework may not be worn into surgery. Leave your suitcase in the car. After surgery it may be brought to your room. For patients admitted to the hospital, discharge time  is determined by your treatment team.   Patients discharged the day of surgery will not be allowed to drive home.   ____ Take these medicines the morning of surgery with A SIP OF WATER:    1.   2.   3.   4.  5.  6.  ____ Fleet Enema (as directed)   ____ Use CHG Soap as directed  ____ Use inhalers on the day of surgery  ____ Stop metformin 2 days prior to surgery    ____ Take 1/2 of usual insulin dose the night before surgery. No insulin the morning          of surgery.   ____ Stop Coumadin/Plavix/aspirin on  ____ Stop Anti-inflammatories on    ____ Stop supplements until after surgery.    ____ [employer]  C-Pap to the hospital.

## 2016-11-10 ENCOUNTER — Encounter: Payer: Self-pay | Admitting: *Deleted

## 2016-11-10 ENCOUNTER — Encounter
Admission: RE | Disposition: A | Discharge status: Home / Self Care | Payer: Self-pay | Source: Ambulatory Visit | Attending: Obstetrics and Gynecology

## 2016-11-10 ENCOUNTER — Ambulatory Visit
Admission: RE | Admit: 2016-11-10 | Discharge: 2016-11-10 | Disposition: A | Discharge status: Home / Self Care | Payer: BC Managed Care – PPO | Source: Ambulatory Visit | Attending: Obstetrics and Gynecology | Admitting: Obstetrics and Gynecology

## 2016-11-10 ENCOUNTER — Ambulatory Visit: Payer: BC Managed Care – PPO | Admitting: Registered Nurse

## 2016-11-10 DIAGNOSIS — G4700 Insomnia, unspecified: Secondary | ICD-10-CM | POA: Diagnosis not present

## 2016-11-10 DIAGNOSIS — E669 Obesity, unspecified: Secondary | ICD-10-CM | POA: Diagnosis not present

## 2016-11-10 DIAGNOSIS — F329 Major depressive disorder, single episode, unspecified: Secondary | ICD-10-CM | POA: Diagnosis not present

## 2016-11-10 DIAGNOSIS — Z6833 Body mass index (BMI) 33.0-33.9, adult: Secondary | ICD-10-CM | POA: Diagnosis not present

## 2016-11-10 DIAGNOSIS — Z79899 Other long term (current) drug therapy: Secondary | ICD-10-CM | POA: Insufficient documentation

## 2016-11-10 DIAGNOSIS — Z302 Encounter for sterilization: Secondary | ICD-10-CM | POA: Diagnosis not present

## 2016-11-10 DIAGNOSIS — Z85828 Personal history of other malignant neoplasm of skin: Secondary | ICD-10-CM | POA: Diagnosis not present

## 2016-11-10 HISTORY — PX: LAPAROSCOPIC TUBAL LIGATION: SHX1937

## 2016-11-10 LAB — CBC
HCT: 39.7 % (ref 35.0–47.0)
Hemoglobin: 13.7 g/dL (ref 12.0–16.0)
MCH: 31.1 pg (ref 26.0–34.0)
MCHC: 34.4 g/dL (ref 32.0–36.0)
MCV: 90.3 fL (ref 80.0–100.0)
PLATELETS: 272 10*3/uL (ref 150–440)
RBC: 4.39 MIL/uL (ref 3.80–5.20)
RDW: 13 % (ref 11.5–14.5)
WBC: 6.9 10*3/uL (ref 3.6–11.0)

## 2016-11-10 LAB — BASIC METABOLIC PANEL
Anion gap: 6 (ref 5–15)
BUN: 13 mg/dL (ref 6–20)
CALCIUM: 9.1 mg/dL (ref 8.9–10.3)
CO2: 25 mmol/L (ref 22–32)
CREATININE: 0.91 mg/dL (ref 0.44–1.00)
Chloride: 109 mmol/L (ref 101–111)
GFR calc Af Amer: >60 mL/min (ref >60)
GFR calc non Af Amer: >60 mL/min (ref >60)
GLUCOSE: 85 mg/dL (ref 65–99)
Potassium: 4 mmol/L (ref 3.5–5.1)
SODIUM: 140 mmol/L (ref 135–145)

## 2016-11-10 LAB — TYPE AND SCREEN
ABO/RH(D): A POS
Antibody Screen: NEGATIVE

## 2016-11-10 LAB — POCT PREGNANCY, URINE: PREG TEST UR: NEGATIVE

## 2016-11-10 SURGERY — LIGATION, FALLOPIAN TUBE, LAPAROSCOPIC
Anesthesia: General | Laterality: Bilateral

## 2016-11-10 MED ORDER — IBUPROFEN 800 MG PO TABS: 800.0000 mg | ORAL_TABLET | Freq: Three times a day (TID) | ORAL | 1 refills | Status: DC | PRN

## 2016-11-10 MED ORDER — DEXAMETHASONE SODIUM PHOSPHATE 10 MG/ML IJ SOLN
INTRAMUSCULAR | Status: AC
  Filled 2016-11-10: qty 1

## 2016-11-10 MED ORDER — FENTANYL CITRATE (PF) 100 MCG/2ML IJ SOLN
25.0000 ug | INTRAMUSCULAR | Status: DC | PRN
  Administered 2016-11-10 (×4): 25 ug via INTRAVENOUS

## 2016-11-10 MED ORDER — LIDOCAINE HCL (CARDIAC) 20 MG/ML IV SOLN
INTRAVENOUS | Status: DC | PRN
  Administered 2016-11-10: 100 mg via INTRAVENOUS

## 2016-11-10 MED ORDER — OXYCODONE HCL 5 MG PO TABS
5.0000 mg | ORAL_TABLET | Freq: Four times a day (QID) | ORAL | Status: DC | PRN
  Administered 2016-11-10: 5 mg via ORAL

## 2016-11-10 MED ORDER — CELECOXIB 200 MG PO CAPS
ORAL_CAPSULE | ORAL | Status: AC
  Administered 2016-11-10: 400 mg via ORAL
  Filled 2016-11-10: qty 2

## 2016-11-10 MED ORDER — MIDAZOLAM HCL 2 MG/2ML IJ SOLN
INTRAMUSCULAR | Status: AC
  Filled 2016-11-10: qty 2

## 2016-11-10 MED ORDER — DEXAMETHASONE SODIUM PHOSPHATE 10 MG/ML IJ SOLN
INTRAMUSCULAR | Status: DC | PRN
  Administered 2016-11-10: 10 mg via INTRAVENOUS

## 2016-11-10 MED ORDER — LACTATED RINGERS IV SOLN
INTRAVENOUS | Status: DC
  Administered 2016-11-10: 09:00:00 via INTRAVENOUS

## 2016-11-10 MED ORDER — EPHEDRINE SULFATE 50 MG/ML IJ SOLN
INTRAMUSCULAR | Status: DC | PRN
  Administered 2016-11-10: 10 mg via INTRAVENOUS

## 2016-11-10 MED ORDER — FENTANYL CITRATE (PF) 100 MCG/2ML IJ SOLN
INTRAMUSCULAR | Status: AC
  Filled 2016-11-10: qty 2

## 2016-11-10 MED ORDER — PROPOFOL 10 MG/ML IV BOLUS
INTRAVENOUS | Status: AC
  Filled 2016-11-10: qty 20

## 2016-11-10 MED ORDER — GABAPENTIN 400 MG PO CAPS
ORAL_CAPSULE | ORAL | Status: AC
  Administered 2016-11-10: 800 mg via ORAL
  Filled 2016-11-10: qty 2

## 2016-11-10 MED ORDER — FAMOTIDINE 20 MG PO TABS
ORAL_TABLET | ORAL | Status: AC
  Filled 2016-11-10: qty 1

## 2016-11-10 MED ORDER — ROCURONIUM BROMIDE 50 MG/5ML IV SOLN
INTRAVENOUS | Status: AC
  Filled 2016-11-10: qty 1

## 2016-11-10 MED ORDER — FAMOTIDINE 20 MG PO TABS
20.0000 mg | ORAL_TABLET | Freq: Once | ORAL | Status: AC
  Administered 2016-11-10: 20 mg via ORAL

## 2016-11-10 MED ORDER — LIDOCAINE HCL (PF) 2 % IJ SOLN
INTRAMUSCULAR | Status: AC
  Filled 2016-11-10: qty 4

## 2016-11-10 MED ORDER — ONDANSETRON HCL 4 MG/2ML IJ SOLN
INTRAMUSCULAR | Status: AC
  Filled 2016-11-10: qty 2

## 2016-11-10 MED ORDER — PROPOFOL 10 MG/ML IV BOLUS
INTRAVENOUS | Status: DC | PRN
  Administered 2016-11-10: 150 mg via INTRAVENOUS

## 2016-11-10 MED ORDER — ACETAMINOPHEN 500 MG PO TABS
1000.0000 mg | ORAL_TABLET | ORAL | Status: AC
  Administered 2016-11-10: 1000 mg via ORAL

## 2016-11-10 MED ORDER — DOCUSATE SODIUM 100 MG PO CAPS: 100.0000 mg | ORAL_CAPSULE | Freq: Two times a day (BID) | ORAL | 0 refills | Status: DC

## 2016-11-10 MED ORDER — ONDANSETRON HCL 4 MG/2ML IJ SOLN: 4.0000 mg | Freq: Once | INTRAMUSCULAR | Status: DC | PRN

## 2016-11-10 MED ORDER — FENTANYL CITRATE (PF) 100 MCG/2ML IJ SOLN
INTRAMUSCULAR | Status: AC
  Administered 2016-11-10: 25 ug via INTRAVENOUS
  Filled 2016-11-10: qty 2

## 2016-11-10 MED ORDER — OXYCODONE HCL 5 MG PO TABS
ORAL_TABLET | ORAL | Status: AC
  Filled 2016-11-10: qty 1

## 2016-11-10 MED ORDER — EPHEDRINE SULFATE 50 MG/ML IJ SOLN
INTRAMUSCULAR | Status: AC
  Filled 2016-11-10: qty 1

## 2016-11-10 MED ORDER — GABAPENTIN 400 MG PO CAPS
800.0000 mg | ORAL_CAPSULE | ORAL | Status: AC
  Administered 2016-11-10: 800 mg via ORAL

## 2016-11-10 MED ORDER — FENTANYL CITRATE (PF) 100 MCG/2ML IJ SOLN
INTRAMUSCULAR | Status: DC | PRN
  Administered 2016-11-10: 100 ug via INTRAVENOUS

## 2016-11-10 MED ORDER — GABAPENTIN 800 MG PO TABS: 800.0000 mg | ORAL_TABLET | Freq: Every day | ORAL | 0 refills | Status: DC

## 2016-11-10 MED ORDER — ACETAMINOPHEN 500 MG PO TABS: 1000.0000 mg | ORAL_TABLET | Freq: Four times a day (QID) | ORAL | 0 refills | Status: AC

## 2016-11-10 MED ORDER — PHENYLEPHRINE HCL 10 MG/ML IJ SOLN
INTRAMUSCULAR | Status: DC | PRN
  Administered 2016-11-10: 100 ug via INTRAVENOUS

## 2016-11-10 MED ORDER — MIDAZOLAM HCL 2 MG/2ML IJ SOLN
INTRAMUSCULAR | Status: DC | PRN
  Administered 2016-11-10: 2 mg via INTRAVENOUS

## 2016-11-10 MED ORDER — SUGAMMADEX SODIUM 200 MG/2ML IV SOLN
INTRAVENOUS | Status: DC | PRN
  Administered 2016-11-10: 200 mg via INTRAVENOUS

## 2016-11-10 MED ORDER — LACTATED RINGERS IV SOLN: INTRAVENOUS | Status: DC

## 2016-11-10 MED ORDER — CELECOXIB 200 MG PO CAPS
400.0000 mg | ORAL_CAPSULE | ORAL | Status: AC
  Administered 2016-11-10: 400 mg via ORAL

## 2016-11-10 MED ORDER — OXYCODONE HCL 5 MG PO CAPS
5.0000 mg | ORAL_CAPSULE | Freq: Four times a day (QID) | ORAL | 0 refills | Status: DC | PRN
Start: 2016-11-10 — End: 2016-12-25

## 2016-11-10 MED ORDER — BUPIVACAINE HCL 0.5 % IJ SOLN
INTRAMUSCULAR | Status: DC | PRN
  Administered 2016-11-10: 17 mL

## 2016-11-10 MED ORDER — SUGAMMADEX SODIUM 200 MG/2ML IV SOLN
INTRAVENOUS | Status: AC
  Filled 2016-11-10: qty 2

## 2016-11-10 MED ORDER — BUPIVACAINE HCL (PF) 0.5 % IJ SOLN
INTRAMUSCULAR | Status: AC
  Filled 2016-11-10: qty 30

## 2016-11-10 MED ORDER — ACETAMINOPHEN 500 MG PO TABS
ORAL_TABLET | ORAL | Status: AC
  Administered 2016-11-10: 1000 mg via ORAL
  Filled 2016-11-10: qty 2

## 2016-11-10 MED ORDER — ACETAMINOPHEN 10 MG/ML IV SOLN
INTRAVENOUS | Status: AC
  Filled 2016-11-10: qty 100

## 2016-11-10 MED ORDER — ROCURONIUM BROMIDE 100 MG/10ML IV SOLN
INTRAVENOUS | Status: DC | PRN
  Administered 2016-11-10: 20 mg via INTRAVENOUS
  Administered 2016-11-10: 30 mg via INTRAVENOUS

## 2016-11-10 SURGICAL SUPPLY — 30 items
BLADE SURG SZ11 CARB STEEL (BLADE) ×3 IMPLANT
CATH ROBINSON RED A/P 16FR (CATHETERS) ×3 IMPLANT
CHLORAPREP W/TINT 26ML (MISCELLANEOUS) ×3 IMPLANT
CLOSURE WOUND 1/4X4 (GAUZE/BANDAGES/DRESSINGS) ×1
DEFOGGER SCOPE WARMER CLEARIFY (MISCELLANEOUS) ×3 IMPLANT
DERMABOND ADVANCED (GAUZE/BANDAGES/DRESSINGS) ×2
DERMABOND ADVANCED .7 DNX12 (GAUZE/BANDAGES/DRESSINGS) ×1 IMPLANT
DRAPE UTILITY 15X26 TOWEL STRL (DRAPES) ×6 IMPLANT
GLOVE BIO SURGEON STRL SZ7 (GLOVE) ×3 IMPLANT
GLOVE INDICATOR 7.5 STRL GRN (GLOVE) ×3 IMPLANT
GOWN STRL REUS W/ TWL LRG LVL3 (GOWN DISPOSABLE) ×2 IMPLANT
GOWN STRL REUS W/TWL LRG LVL3 (GOWN DISPOSABLE) ×4
GRASPER SUT TROCAR 14GX15 (MISCELLANEOUS) ×6 IMPLANT
KIT RM TURNOVER CYSTO AR (KITS) ×3 IMPLANT
LABEL OR SOLS (LABEL) ×3 IMPLANT
LIGASURE BLUNT 5MM 37CM (INSTRUMENTS) IMPLANT
NS IRRIG 500ML POUR BTL (IV SOLUTION) ×3 IMPLANT
PACK GYN LAPAROSCOPIC (MISCELLANEOUS) ×3 IMPLANT
PAD OB MATERNITY 4.3X12.25 (PERSONAL CARE ITEMS) ×3 IMPLANT
PAD PREP 24X41 OB/GYN DISP (PERSONAL CARE ITEMS) ×3 IMPLANT
SLEEVE ENDOPATH XCEL 5M (ENDOMECHANICALS) ×6 IMPLANT
STRIP CLOSURE SKIN 1/4X4 (GAUZE/BANDAGES/DRESSINGS) ×2 IMPLANT
SUT MNCRL 4-0 (SUTURE) ×2
SUT MNCRL 4-0 27XMFL (SUTURE) ×1
SUT VIC AB 0 UR5 27 (SUTURE) ×3 IMPLANT
SUT VIC AB 2-0 UR6 27 (SUTURE) ×3 IMPLANT
SUTURE MNCRL 4-0 27XMF (SUTURE) ×1 IMPLANT
TROCAR ENDO BLADELESS 11MM (ENDOMECHANICALS) IMPLANT
TROCAR XCEL NON-BLD 5MMX100MML (ENDOMECHANICALS) ×3 IMPLANT
TUBING INSUFFLATOR HI FLOW (MISCELLANEOUS) IMPLANT

## 2016-11-10 NOTE — Transfer of Care (Signed)
Immediate Anesthesia Transfer of Care Note  Patient: Brittany Davila  Procedure(s) Performed: Procedure(s): LAPAROSCOPIC TUBAL LIGATION (Bilateral)  Patient Location: PACU  Anesthesia Type:General  Level of Consciousness: sedated  Airway & Oxygen Therapy: Patient Spontanous Breathing and Patient connected to face mask oxygen  Post-op Assessment: Report given to RN and Post -op Vital signs reviewed and stable  Post vital signs: Reviewed and stable  Last Vitals:  Vitals:   11/10/16 0838 11/10/16 1127  BP: 111/83 125/76  Pulse: 78 86  Resp: 20 13  Temp: (!) 36.1 C (!) 36.1 C  SpO2: 08% 676%    Complications: No apparent anesthesia complications

## 2016-11-10 NOTE — Anesthesia Post-op Follow-up Note (Signed)
Anesthesia QCDR form completed.        

## 2016-11-10 NOTE — Anesthesia Procedure Notes (Signed)
Procedure Name: Intubation Date/Time: 11/10/2016 10:15 AM Performed by: Doreen Salvage Pre-anesthesia Checklist: Patient identified, Patient being monitored, Timeout performed, Emergency Drugs available and Suction available Patient Re-evaluated:Patient Re-evaluated prior to induction Oxygen Delivery Method: Circle system utilized Preoxygenation: Pre-oxygenation with 100% oxygen Induction Type: IV induction Ventilation: Mask ventilation without difficulty Laryngoscope Size: Mac and 3 Grade View: Grade I Tube type: Oral Tube size: 7.0 mm Number of attempts: 1 Airway Equipment and Method: Stylet Placement Confirmation: ETT inserted through vocal cords under direct vision,  positive ETCO2 and breath sounds checked- equal and bilateral Secured at: 21 cm Tube secured with: Tape Dental Injury: Teeth and Oropharynx as per pre-operative assessment

## 2016-11-10 NOTE — Discharge Instructions (Signed)
Laparoscopic Tubal Ligation Discharge Instructions  Laparoscopic tubal ligation and fulguration ties your fallopian tubes to prevent pregnancy in the future  RISKS AND COMPLICATIONS   Infection.  Bleeding.  Injury to surrounding organs.  Anesthetic side effects.  Failure of the procedure.  Risks of future ectopic pregnancy  PROCEDURE   You may be given a medicine to help you relax (sedative) before the procedure. You will be given a medicine to make you sleep (general anesthetic) during the procedure.  A tube will be put down your throat to help your breath while under general anesthesia.  Two small cuts (incisions) are made in the lower abdominal area and one incision is made near the belly button.  Your abdominal area will be inflated with a safe gas (carbon dioxide). This helps give the surgeon room to operate, visualize, and helps the surgeon avoid other organs.  A thin, lighted tube (laparoscope) with a camera attached is inserted into your abdomen through the incision near the belly button. Other small instruments may also be inserted through other abdominal incisions.  The fallopian tube is located and are removed.  After the fallopian tube is removed, the gas is released from the abdomen.  The incisions will be closed with stitches (sutures), and Dermabond. A bandage may be placed over the incisions.  AFTER THE PROCEDURE   You will also have some mild abdominal discomfort for 3-7 days. You will be given pain medicine to ease any discomfort.  As long as there are no problems, you may be allowed to go home. Someone will need to drive you home and be with you for at least 24 hours once home.  You may have some mild discomfort in the throat. This is from the tube placed in your throat while you were sleeping.  You may experience discomfort in the shoulder area from some trapped air between the liver and diaphragm. This sensation is normal and will slowly go away on its  own.  HOME CARE INSTRUCTIONS   Take all medicines as directed.  Only take over-the-counter or prescription medicines for pain, discomfort, or fever as directed by your caregiver.  Resume daily activities as directed.  Showers are preferred over baths.  You may resume sexual activities in 1 week or as directed.  Do not drive while taking narcotics.  SEEK MEDICAL CARE IF: .  There is increasing abdominal pain.  You feel lightheaded or faint.  You have the chills.  You have an oral temperature above 102 F (38.9 C).  There is pus-like (purulent) drainage from any of the wounds.  You are unable to pass gas or have a bowel movement.  You feel sick to your stomach (nauseous) or throw up (vomit).  MAKE SURE YOU:   Understand these instructions.  Will watch your condition.  Will get help right away if you are not doing well or get worse.  ExitCare Patient Information 2013 [place].

## 2016-11-10 NOTE — Op Note (Signed)
Brittany Davila 11/10/2016  PREOPERATIVE DIAGNOSIS:  Undesired fertility  POSTOPERATIVE DIAGNOSIS:  Undesired fertility  PROCEDURE:  Laparoscopic Bilateral Tubal Sterilization using Bipolar Coagulation with partial salpingectomy, including fimbriae   ANESTHESIA:  General endotracheal  ANESTHESIOLOGIST: Gunnar Bulla, MD Anesthesiologist: Gunnar Bulla, MD CRNA: Doreen Salvage, CRNA  SURGEON: Angelina Pih, MD  COMPLICATIONS:  None immediate.  ESTIMATED BLOOD LOSS:  Less than 20 ml.  FLUIDS: 800 ml LR.  URINE OUTPUT:  150 ml of clear urine.  INDICATIONS: 41 y.o. F G1P1 with undesired fertility, desires permanent sterilization. Other reversible forms of contraception were discussed with patient; she declines all other modalities.  Risks of procedure discussed with patient including permanence of method, bleeding, infection, injury to surrounding organs and need for additional procedures including laparotomy, risk of regret.  Failure risk of 0.5-1% with increased risk of ectopic gestation if pregnancy occurs was also discussed with patient.      FINDINGS:  Normal uterus, tubes, and ovaries.  TECHNIQUE:  The patient was taken to the operating room where general anesthesia was obtained without difficulty.  She was then placed in the dorsal lithotomy position and prepared and draped in sterile fashion.  The bladder was cathed for an estimated amount of clear urine. After an adequate timeout was performed, a bivalved speculum was then placed in the patient's vagina, and the anterior lip of cervix grasped with the single-tooth tenaculum.  The uterine manipulator was then advanced into the uterus.  The speculum was removed from the vagina.   Attention was then turned to the patient's abdomen where a 5-mm skin incision was made in the umbilical fold.  The Optiview 5-mm trocar and sleeve were then advanced without difficulty with the laparoscope under direct visualization into the abdomen.  The  abdomen was then insufflated with carbon dioxide gas and adequate pneumoperitoneum was obtained.  A survey of the patient's pelvis and abdomen revealed entirely normal anatomy.     The fallopian tubes were observed and found to be normal in appearance. A 54mm port was placed in each lower quadrant under direct visualization. A Ligasure device was then advanced through the operative port and used to coagulate and excise the distal portion of the Fallopian tube, including the fimbriated ends.  Good blanching and coagulation was noted at the site of the application.  There was no bleeding noted in the mesosalpinx.  A similar process was carried out on the right fallopian tube allowing for bilateral tubal sterilization.   Good hemostasis was noted overall.  Local analgesia was drizzled on both operative sites.The instruments were then removed from the patient's abdomen and the fascial incision was repaired with 0 Vicryl, and the skin was closed with Dermabond.  The uterine manipulator and the tenaculum were removed from the vagina without complications. The patient tolerated the procedure well.  Sponge, lap, and needle counts were correct times two.  The patient was then taken to the recovery room awake, extubated and in stable condition.

## 2016-11-10 NOTE — Anesthesia Postprocedure Evaluation (Signed)
Anesthesia Post Note  Patient: Brittany Davila  Procedure(s) Performed: LAPAROSCOPIC TUBAL LIGATION (Bilateral )  Patient location during evaluation: PACU Anesthesia Type: General Level of consciousness: awake and alert Pain management: pain level controlled Vital Signs Assessment: post-procedure vital signs reviewed and stable Respiratory status: spontaneous breathing, nonlabored ventilation, respiratory function stable and patient connected to nasal cannula oxygen Cardiovascular status: blood pressure returned to baseline and stable Postop Assessment: no apparent nausea or vomiting Anesthetic complications: no     Last Vitals:  Vitals:   11/10/16 1221 11/10/16 1254  BP: 130/83 121/79  Pulse: 63   Resp: 16   Temp: (!) 35.7 C   SpO2: 98%     Last Pain:  Vitals:   11/10/16 1221  TempSrc: Temporal  PainSc: [place]

## 2016-11-10 NOTE — Interval H&P Note (Signed)
History and Physical Interval Note:  11/10/2016 9:42 AM  Brittany Davila  has presented today for surgery, with the diagnosis of Sterilization  The various methods of treatment have been discussed with the patient and family. After consideration of risks, benefits and other options for treatment, the patient has consented to  Procedure(s): LAPAROSCOPIC TUBAL LIGATION (Bilateral) as a surgical intervention .  The patient's history has been reviewed, patient examined, no change in status, stable for surgery.  I have reviewed the patient's chart and labs.  Questions were answered to the patient's satisfaction.     Benjaman Kindler

## 2016-11-10 NOTE — Anesthesia Preprocedure Evaluation (Signed)
Anesthesia Evaluation  Patient identified by MRN, date of birth, ID band Patient awake    Reviewed: Allergy & Precautions, NPO status , Patient's Chart, lab work & pertinent test results, reviewed documented beta blocker date and time   Airway Mallampati: III  TM Distance: >3 FB     Dental  (+) Chipped   Pulmonary           Cardiovascular      Neuro/Psych  Headaches, PSYCHIATRIC DISORDERS Anxiety Depression Bipolar Disorder  Neuromuscular disease    GI/Hepatic   Endo/Other    Renal/GU      Musculoskeletal   Abdominal   Peds  Hematology   Anesthesia Other Findings Obese.   Reproductive/Obstetrics                             Anesthesia Physical Anesthesia Plan  ASA: III  Anesthesia Plan: General   Post-op Pain Management:    Induction: Intravenous  PONV Risk Score and Plan:   Airway Management Planned: Oral ETT  Additional Equipment:   Intra-op Plan:   Post-operative Plan:   Informed Consent: I have reviewed the patients History and Physical, chart, labs and discussed the procedure including the risks, benefits and alternatives for the proposed anesthesia with the patient or authorized representative who has indicated his/her understanding and acceptance.     Plan Discussed with: CRNA  Anesthesia Plan Comments:         Anesthesia Quick Evaluation

## 2016-11-10 NOTE — H&P (Signed)
HPI:  41yo G1P1 who desires no more children. She is certain.  Pt interested in sterilization No hx of abdominal surgeries  BMI: 32.3  Past Medical History:  has a past medical history of Bell's palsy; Depression, unspecified; Insomnia; Obesity, unspecified; Skin cancer (2014); and Tobacco abuse.  Past Surgical History:  has a past surgical history that includes Tonsillectomy and adenoidectomy and Tympanostomy tubes. Family History: family history includes Breast cancer in her paternal grandmother; Colon cancer in her paternal grandfather; Diabetes type II in her maternal grandfather; Pancreatic cancer in her paternal grandfather. Social History:  reports that she has never smoked. She has never used smokeless tobacco. She reports that she drinks alcohol. She reports that she does not use drugs. OB/GYN History:          OB History    Gravida Para Term Preterm AB Living   1 1 1     1    SAB TAB Ectopic Molar Multiple Live Births                    Allergies: is allergic to pollen extracts. Medications:  Current Outpatient Prescriptions:  .  ALPRAZolam (XANAX) 1 MG tablet, once daily as needed., Disp: , Rfl:  .  butalbital-acetaminophen-caffeine (FIORICET) 50-325-40 mg tablet, TAKE 1-2 TABLETS BY MOUTH EVERY SIX HOURS AS NEEDED FOR HEADACHE, Disp: , Rfl: 0 .  lamoTRIgine (LAMICTAL) 200 MG tablet, Take 200 mg by mouth 2 (two) times daily., Disp: , Rfl:  .  LATUDA 120 mg tablet, TAKE 1 TABLET BY MOUTH DAILY AS DIRECTED, Disp: , Rfl: 2 .  loratadine (CLARITIN) 10 mg tablet, Take 10 mg by mouth once daily., Disp: , Rfl:  .  traZODone (DESYREL) 100 MG tablet, Take 200 mg by mouth nightly., Disp: , Rfl:   Review of Systems: No SOB, no palpitations or chest pain, no new lower extremity edema, no nausea or vomiting or bowel or bladder complaints. See HPI for gyn specific ROS.    Exam:      Vitals:   10/05/16 1519  BP: 103/77  Pulse: 66   Body mass index is 33.42  kg/m.  General: Well-developed, well-nourished  female in no acute distress Body mass index is 33.42 kg/m. Skin: No rashes, ulcers or skin lesions noted. No excessive hirsutism or acne noted.  Neurological: Appears alert and oriented and is a good historian. No gross abnormalities are noted. Psychological: Normal affect and mood. No signs of anxiety or depression noted.  Pelvic exam: Deferred    Impression:   The encounter diagnosis was Request for sterilization.    Plan:   Patient desires surgical sterilization.  Patient has been counseled on alternate forms of contraception including hormonal forms, IUD's and barrier methods. She has been counseled on risks of surgical sterilization including bleeding, infection, pain, injury during procedure, risk of need for further procedures/surgeries due to injury or abnormalities at the time of surgery, thromboembolic events, exacerbation of ongoing medical conditions, risk of ectopic pregnancy, risk of failure of procedure to prevent pregnancy, medication reactions as well as the risk of anesthesia.  Patient verbalizes understanding.  Consent form signed.  Preoperative and postoperative instructions provided. Written and verbal education provided.  No barriers to learning.

## 2016-11-13 LAB — SURGICAL PATHOLOGY

## 2016-11-23 ENCOUNTER — Other Ambulatory Visit: Payer: Self-pay | Admitting: Obstetrics and Gynecology

## 2016-11-23 ENCOUNTER — Ambulatory Visit
Admission: RE | Admit: 2016-11-23 | Discharge: 2016-11-23 | Disposition: A | Discharge status: Home / Self Care | Payer: BC Managed Care – PPO | Source: Ambulatory Visit | Attending: Obstetrics and Gynecology | Admitting: Obstetrics and Gynecology

## 2016-11-23 DIAGNOSIS — G8918 Other acute postprocedural pain: Secondary | ICD-10-CM

## 2016-11-23 MED ORDER — IOPAMIDOL (ISOVUE-300) INJECTION 61%
100.0000 mL | Freq: Once | INTRAVENOUS | Status: AC | PRN
  Administered 2016-11-23: 100 mL via INTRAVENOUS

## 2016-12-25 ENCOUNTER — Ambulatory Visit: Payer: BC Managed Care – PPO | Admitting: Internal Medicine

## 2016-12-25 ENCOUNTER — Encounter: Payer: Self-pay | Admitting: Internal Medicine

## 2016-12-25 VITALS — BP 126/78 | HR 100 | Temp 98.9°F | Wt 215.0 lb

## 2016-12-25 DIAGNOSIS — J329 Chronic sinusitis, unspecified: Secondary | ICD-10-CM

## 2016-12-25 DIAGNOSIS — B9789 Other viral agents as the cause of diseases classified elsewhere: Secondary | ICD-10-CM

## 2016-12-25 MED ORDER — METHYLPREDNISOLONE ACETATE 80 MG/ML IJ SUSP
80.0000 mg | Freq: Once | INTRAMUSCULAR | Status: AC
  Administered 2016-12-25: 80 mg via INTRAMUSCULAR

## 2016-12-25 NOTE — Patient Instructions (Signed)

## 2016-12-25 NOTE — Progress Notes (Signed)
Subjective:    Patient ID: Brittany Davila, female    DOB: Feb 20, 1975, 41 y.o.   MRN: [id number]  HPI  Pt presents to the clinic today with c/o right side facial pressure, ear fullness, runny nose and cough. This started 3 days ago. She is blowing clear mucous out of her nose. She denies ear pain but does have some decreased hearing in the right ear. The cough is non productive. She reports she has been running low grade fevers, but denies chills or body aches. She has tried Mucinex D and Copywriter, advertising with minimal relief.   Review of Systems      Past Medical History:  Diagnosis Date  . Anxiety   . Basal cell carcinoma (BCC) of face   . Bell's palsy    H/O  . Bipolar 1 disorder ([place])   . Depression   . Headache    MIGRAINES  . History of genital warts     Current Outpatient Medications  Medication Sig Dispense Refill  . ALPRAZolam (XANAX) 1 MG tablet Take 1 mg by mouth 2 (two) times daily as needed for anxiety.    . lamoTRIgine (LAMICTAL) 25 MG tablet Take 50 mg by mouth at bedtime.    . traZODone (DESYREL) 100 MG tablet Take 200 mg by mouth at bedtime as needed for sleep.     No current facility-administered medications for this visit.     No Known Allergies  Family History  Problem Relation Age of Onset  . Diabetes Maternal Grandfather   . Breast cancer Paternal Grandmother   . Colon cancer Paternal Grandfather   . Pancreatic cancer Paternal Grandfather   . Lung cancer Maternal Aunt     Social History   Socioeconomic History  . Marital status: Married    Spouse name: Not on file  . Number of children: Not on file  . Years of education: Not on file  . Highest education level: Not on file  Social Needs  . Financial resource strain: Not on file  . Food insecurity - worry: Not on file  . Food insecurity - inability: Not on file  . Transportation needs - medical: Not on file  . Transportation needs - non-medical: Not on file  Occupational History  . Not on file    Tobacco Use  . Smoking status: Never Smoker  . Smokeless tobacco: Never Used  Substance and Sexual Activity  . Alcohol use: Yes    Alcohol/week: 0.0 oz    Comment: occasional  . Drug use: No  . Sexual activity: No  Other Topics Concern  . Not on file  Social History Narrative  . Not on file     Constitutional: Denies fever, malaise, fatigue, headache or abrupt weight changes.  HEENT: Pt reports facial pressure, runny nose, ear fullness. Denies eye pain, eye redness, ringing in the ears, wax buildup, nasal congestion, bloody nose, or sore throat. Respiratory: Pt reports cough. Denies difficulty breathing, shortness of breath, or sputum production.    No other specific complaints in a complete review of systems (except as listed in HPI above).  Objective:   Physical Exam  BP 126/78   Pulse 100   Temp 98.9 F (37.2 C) (Oral)   Wt 215 lb (97.5 kg)   LMP 12/04/2016   SpO2 97%   BMI 31.75 kg/m  Wt Readings from Last 3 Encounters:  12/25/16 215 lb (97.5 kg)  11/10/16 218 lb (98.9 kg)  10/30/16 218 lb (98.9 kg)  General: Appears her stated age, in NAD. HEENT: Head: normal shape and size, no sinus tenderness noted; Ears: Tm's gray and intact, normal light reflex, + serous effusion on the right; Nose: mucosa pink and moist, septum midline; Throat/Mouth: Teeth present, mucosa pink and moist, + PND, no exudate, lesions or ulcerations noted.  Neck:  No adenopathy noted. Pulmonary/Chest: Normal effort and positive vesicular breath sounds. No respiratory distress. No wheezes, rales or ronchi noted.   BMET    Component Value Date/Time   NA 140 11/10/2016 0825   K 4.0 11/10/2016 0825   CL 109 11/10/2016 0825   CO2 25 11/10/2016 0825   GLUCOSE 85 11/10/2016 0825   BUN 13 11/10/2016 0825   CREATININE 0.91 11/10/2016 0825   CALCIUM 9.1 11/10/2016 0825   GFRNONAA >60 11/10/2016 0825   GFRAA >60 11/10/2016 0825    Lipid Panel  No results found for: CHOL, TRIG, HDL, CHOLHDL,  VLDL, LDLCALC  CBC    Component Value Date/Time   WBC 6.9 11/10/2016 0825   RBC 4.39 11/10/2016 0825   HGB 13.7 11/10/2016 0825   HCT 39.7 11/10/2016 0825   PLT 272 11/10/2016 0825   MCV 90.3 11/10/2016 0825   MCH 31.1 11/10/2016 0825   MCHC 34.4 11/10/2016 0825   RDW 13.0 11/10/2016 0825    Hgb A1C No results found for: HGBA1C          Assessment & Plan:   Viral Sinusitis:  Can try a Neti Pot 80 mg Depo IM today Start Zyrtec and Flonase OTC x 1-2 weeks Delsym as needed for cough  Return precautions discussed Webb Silversmith, NP

## 2017-01-25 ENCOUNTER — Encounter: Payer: BC Managed Care – PPO | Admitting: Internal Medicine

## 2017-02-18 ENCOUNTER — Emergency Department: Payer: BC Managed Care – PPO

## 2017-02-18 ENCOUNTER — Encounter: Payer: Self-pay | Admitting: Emergency Medicine

## 2017-02-18 ENCOUNTER — Emergency Department
Admission: EM | Admit: 2017-02-18 | Discharge: 2017-02-18 | Disposition: A | Discharge status: Home / Self Care | Payer: BC Managed Care – PPO | Attending: Emergency Medicine | Admitting: Emergency Medicine

## 2017-02-18 ENCOUNTER — Other Ambulatory Visit: Payer: Self-pay

## 2017-02-18 DIAGNOSIS — R0789 Other chest pain: Secondary | ICD-10-CM

## 2017-02-18 DIAGNOSIS — R202 Paresthesia of skin: Secondary | ICD-10-CM | POA: Diagnosis not present

## 2017-02-18 DIAGNOSIS — Z79899 Other long term (current) drug therapy: Secondary | ICD-10-CM | POA: Insufficient documentation

## 2017-02-18 DIAGNOSIS — R079 Chest pain, unspecified: Secondary | ICD-10-CM | POA: Diagnosis present

## 2017-02-18 DIAGNOSIS — M542 Cervicalgia: Secondary | ICD-10-CM

## 2017-02-18 DIAGNOSIS — Z85828 Personal history of other malignant neoplasm of skin: Secondary | ICD-10-CM | POA: Diagnosis not present

## 2017-02-18 LAB — CBC
HEMATOCRIT: 41.6 % (ref 35.0–47.0)
HEMOGLOBIN: 14.1 g/dL (ref 12.0–16.0)
MCH: 30.6 pg (ref 26.0–34.0)
MCHC: 33.8 g/dL (ref 32.0–36.0)
MCV: 90.5 fL (ref 80.0–100.0)
Platelets: 287 10*3/uL (ref 150–440)
RBC: 4.6 MIL/uL (ref 3.80–5.20)
RDW: 13.5 % (ref 11.5–14.5)
WBC: 7.7 10*3/uL (ref 3.6–11.0)

## 2017-02-18 LAB — TROPONIN I: Troponin I: <0.03 ng/mL (ref <0.03)

## 2017-02-18 LAB — BASIC METABOLIC PANEL
ANION GAP: 9 (ref 5–15)
BUN: 15 mg/dL (ref 6–20)
CO2: 23 mmol/L (ref 22–32)
Calcium: 9.4 mg/dL (ref 8.9–10.3)
Chloride: 107 mmol/L (ref 101–111)
Creatinine, Ser: 0.92 mg/dL (ref 0.44–1.00)
Glucose, Bld: 100 mg/dL — ABNORMAL HIGH (ref 65–99)
POTASSIUM: 4 mmol/L (ref 3.5–5.1)
SODIUM: 139 mmol/L (ref 135–145)

## 2017-02-18 MED ORDER — CYCLOBENZAPRINE HCL 7.5 MG PO TABS: 7.5000 mg | ORAL_TABLET | Freq: Three times a day (TID) | ORAL | 0 refills | Status: DC | PRN

## 2017-02-18 NOTE — ED Notes (Signed)
NAD noted at time of D/C. Pt denies questions or concerns. Pt ambulatory to the lobby at this time.  

## 2017-02-18 NOTE — ED Triage Notes (Signed)
Pt presents to ED with c/o generalized chest pain with radiation to her R upper back. Pt also c/o bilateral arm numbness. Pt has full ROM to all extremities, able to speak in complete sentences. Pt states bilateral arm numbness started several days ago.

## 2017-02-18 NOTE — ED Notes (Signed)
Pt c/o RT arm numbness and LFT upper arm numbness with CP x 1wk, speech clear.

## 2017-02-18 NOTE — ED Provider Notes (Signed)
[facility] Emergency Department Provider Note  ____________________________________________   First MD Initiated Contact with Patient 02/18/17 1410     (approximate)  I have reviewed the triage vital signs and the nursing notes.   HISTORY  Chief Complaint Chest Pain and Numbness   HPI Brittany Davila is a 42 y.o. female presents for evaluation of tingling feeling in her right hand and right arm.  Patient reports since Tuesday she has been noticing a persistent feeling of a tingling sensation throughout the right hand.  There is not associated any weakness.  She demonstrates to me that she is able to text normally with both hands on her phone.  She moves her arm without difficulty.  Reports the symptoms seem worse when she is at work where she has her hands positioned high and types on a keyboard for several hours each day  Yesterday she also started to feel like a strange she describes it as a "knot tightness" but different were weird feeling across the right upper chest.  No shortness of breath.  Occasionally the tingling in her hand has seem like it has run down slightly on the left side to the level of her elbow.  But she does report an occasional discomfort in her right lower neck that seems to be associated with tingling.  No recent surgeries.  Had surgery around October.  No history of blood clots.  Takes no blood thinners.  No cough.  No shortness of breath.  No left-sided chest pain or radiation of the back.  No numbness or tingling in the lower extremities.  Denies motor weakness.  No trouble speaking.  No headache.  No facial droop  Denies pregnancy, menstrual cycle started today  Past Medical History:  Diagnosis Date  . Anxiety   . Basal cell carcinoma (BCC) of face   . Bell's palsy    H/O  . Bipolar 1 disorder ([place])   . Depression   . Headache    MIGRAINES  . History of genital warts     Patient Active Problem List   Diagnosis Date Noted  .  Anxiety and depression 05/15/2016  . Bipolar 1 disorder ([place]) 05/15/2016  . Basal cell carcinoma (BCC) of face 05/15/2016  . Insomnia 05/15/2016    Past Surgical History:  Procedure Laterality Date  . BREAST CYST ASPIRATION Left   . LAPAROSCOPIC TUBAL LIGATION Bilateral 11/10/2016   Procedure: LAPAROSCOPIC TUBAL LIGATION;  Surgeon: Benjaman Kindler, MD;  Location: [facility] ORS;  Service: Gynecology;  Laterality: Bilateral;  . TONSILLECTOMY AND ADENOIDECTOMY      Prior to Admission medications   Medication Sig Start Date End Date Taking? Authorizing Provider  ALPRAZolam Duanne Moron) 1 MG tablet Take 1 mg by mouth 2 (two) times daily as needed for anxiety.    [provider]  cyclobenzaprine (FEXMID) 7.5 MG tablet Take 1 tablet (7.5 mg total) by mouth 3 (three) times daily as needed for muscle spasms. 02/18/17   Delman Kitten, MD  lamoTRIgine (LAMICTAL) 25 MG tablet Take 50 mg by mouth at bedtime.    [provider]  traZODone (DESYREL) 100 MG tablet Take 200 mg by mouth at bedtime as needed for sleep.    [provider]    Allergies Patient has no known allergies.  Family History  Problem Relation Age of Onset  . Diabetes Maternal Grandfather   . Breast cancer Paternal Grandmother   . Colon cancer Paternal Grandfather   . Pancreatic cancer Paternal Grandfather   .  Lung cancer Maternal Aunt     Social History Social History   Tobacco Use  . Smoking status: Never Smoker  . Smokeless tobacco: Never Used  Substance Use Topics  . Alcohol use: Yes    Alcohol/week: 0.0 oz    Comment: occasional  . Drug use: No    Review of Systems Constitutional: No fever/chills Eyes: No visual changes. ENT: No sore throat. Cardiovascular: See HPI respiratory: Denies shortness of breath. Gastrointestinal: No abdominal pain.  No nausea, no vomiting.  No diarrhea.  No constipation. Genitourinary: Negative for dysuria. Musculoskeletal: Negative for back pain. Skin: Negative  for rash. Neurological: Negative for headaches, no focal weakness.    ____________________________________________   PHYSICAL EXAM:  VITAL SIGNS: ED Triage Vitals  Enc Vitals Group     BP 02/18/17 1315 126/76     Pulse Rate 02/18/17 1315 93     Resp 02/18/17 1315 18     Temp 02/18/17 1315 98.1 F (36.7 C)     Temp Source 02/18/17 1315 Oral     SpO2 02/18/17 1315 98 %     Weight 02/18/17 1318 203 lb (92.1 kg)     Height 02/18/17 1318 5\' 9"  (1.753 m)     Head Circumference --      Peak Flow --      Pain Score 02/18/17 1317 6     Pain Loc --      Pain Edu? --      Excl. in [place]? --     Constitutional: Alert and oriented. Well appearing and in no acute distress.  Very pleasant.  Well dressed. Eyes: Conjunctivae are normal. Head: Atraumatic. Nose: No congestion/rhinnorhea. Mouth/Throat: Mucous membranes are moist. Neck: No stridor.   Cardiovascular: Normal rate, regular rhythm. Grossly normal heart sounds.  Good peripheral circulation. Respiratory: Normal respiratory effort.  No retractions. Lungs CTAB. Gastrointestinal: Soft and nontender. No distention. Musculoskeletal: No lower extremity tenderness nor edema.  RIGHT Right upper extremity demonstrates normal strength, good use of all muscles. No edema bruising or contusions of the right shoulder/upper arm, right elbow, right forearm / hand. Full range of motion of the right right upper extremity without pain. No evidence of trauma. Strong radial pulse. Intact median/ulnar/radial neuro-muscular exam.  No objective sensory deficit noted.  LEFT Left upper extremity demonstrates normal strength, good use of all muscles. No edema bruising or contusions of the left shoulder/upper arm, left elbow, left forearm / hand. Full range of motion of the left  upper extremity without pain. No evidence of trauma. Strong radial pulse. Intact median/ulnar/radial neuro-muscular exam.   Neurologic:  Normal speech and language. No gross focal  neurologic deficits are appreciated.  Skin:  Skin is warm, dry and intact. No rash noted. Psychiatric: Mood and affect are normal. Speech and behavior are normal.  ____________________________________________   LABS (all labs ordered are listed, but only abnormal results are displayed)  Labs Reviewed  BASIC METABOLIC PANEL - Abnormal; Notable for the following components:      Result Value   Glucose, Bld 100 (*)    All other components within normal limits  CBC  TROPONIN I  POC URINE PREG, ED   ____________________________________________  EKG  Reviewed interrupt me at 1310 Heart rate 80 QRS 89 QTC 400 Normal sinus rhythm, no evidence of ischemia ____________________________________________  RADIOLOGY  Dg Chest 2 View  Result Date: 02/18/2017 CLINICAL DATA:  42 year old female with chest pain radiating to the right upper back. Bilateral arm numbness. EXAM: CHEST  2 VIEW COMPARISON:  CT Abdomen and Pelvis 11/23/2016. FINDINGS: Normal lung volumes. Normal cardiac size and mediastinal contours. Visualized tracheal air column is within normal limits. The lungs are clear. No pneumothorax or pleural effusion. 8 millimeter left renal calculus redemonstrated. Negative visible bowel gas pattern. No osseous abnormality identified. IMPRESSION: 1. Negative chest.  No acute cardiopulmonary abnormality. 2. Left nephrolithiasis redemonstrated. Electronically Signed   By: Genevie Ann M.D.   On: 02/18/2017 14:32   Dg Cervical Spine 2-3 Views  Result Date: 02/18/2017 CLINICAL DATA:  Right-sided neck pain. Pain radiates to the right arm with paresthesia. EXAM: CERVICAL SPINE - 2-3 VIEW COMPARISON:  None. FINDINGS: There is no evidence of cervical spine fracture or prevertebral soft tissue swelling. Alignment is normal. No other significant bone abnormalities are identified. IMPRESSION: Negative cervical spine radiographs. Electronically Signed   By: Markus Daft M.D.   On: 02/18/2017 15:07    Cervical  spine x-ray normal.  Chest x-ray no acute.  Viewed by me ____________________________________________   PROCEDURES  Procedure(s) performed: None  Procedures  Critical Care performed: No  ____________________________________________   INITIAL IMPRESSION / ASSESSMENT AND PLAN / ED COURSE  Pertinent labs & imaging results that were available during my care of the patient were reviewed by me and considered in my medical decision making (see chart for details).  Patient presents for evaluation of paresthesia.  Appears to primarily involve the right upper extremity, worsened when she is at work with typing.  Also some slight discomfort in the left arm from time to time.  No objective evidence of neurologic deficit.  She is able to text pick up her phone, lift both arms with normal strength, no evidence of any deficits.  No signs of a central etiology, no signs or symptoms suggest stroke.  No significant risk factors  Clinical Course as of Feb 19 1527  Sun Feb 18, 2017  1418     Pulmonary Embolism Rule-out Criteria (PERC rule)                        If YES to ANY of the following, the [facility] rule is not satisfied and cannot be used to rule out PE in this patient (consider d-dimer or imaging depending on pre-test probability).                      If NO to ALL of the following, AND the clinician's pre-test probability is <15%, the [facility] rule is satisfied and there is no need for further workup (including no need to obtain a d-dimer) as the post-test probability of pulmonary embolism is <2%.                      Mnemonic is HAD CLOTS   H - hormone use (exogenous estrogen)      No. A - age > 17                                                 No. D - DVT/PE history                                      No.   C - coughing blood (hemoptysis)  No. L - leg swelling, unilateral                             No. O - O2 Sat on Room Air < 95%                  No. T - tachycardia (HR ?  100)                         No. S - surgery or trauma, recent                      No.   Based on my evaluation of the patient, including application of this decision instrument, further testing to evaluate for pulmonary embolism is not indicated at this time. I have discussed this recommendation with the patient who states understanding and agreement with this plan.   [MQ]    Clinical Course User Index [MQ] Delman Kitten, MD   Given her association of the right-sided neck discomfort and worsened by use of typing at work I suspect is likely peripheral neurologic etiology, possibly some radicular discomfort or minimal neuropathy.  Again no objective evidence of a deficit.  Will obtain x-rays  No chest pain extremely atypical, normal EKG, no major cardiac risk factors to noted, and her heart score is < 1.  No exertional component.  Discussed with the patient, recommended traditional conservative management.  We will also prescribe a muscle relaxant advised her not to drive within 8 hours while taking this for which she is agreeable.  Discussed that she has a primary doctor, discussed careful return precautions and she will follow-up with her primary if her symptoms are not improving shortly or if her symptoms are worsening advised to return to the ER for further evaluation, also consider obtaining MRI of the cervical spine if symptoms worsen or persist for a long period  ____________________________________________   FINAL CLINICAL IMPRESSION(S) / ED DIAGNOSES  Final diagnoses:  Paresthesia  Cervicalgia  Atypical chest pain      NEW MEDICATIONS STARTED DURING THIS VISIT:  New Prescriptions   CYCLOBENZAPRINE (FEXMID) 7.5 MG TABLET    Take 1 tablet (7.5 mg total) by mouth 3 (three) times daily as needed for muscle spasms.     Note:  This document was prepared using Dragon voice recognition software and may include unintentional dictation errors.     Delman Kitten, MD 02/18/17  1529

## 2017-02-21 ENCOUNTER — Ambulatory Visit: Payer: BC Managed Care – PPO | Admitting: Internal Medicine

## 2017-02-21 ENCOUNTER — Encounter: Payer: Self-pay | Admitting: Internal Medicine

## 2017-02-21 VITALS — BP 100/68 | HR 140 | Temp 98.9°F | Wt 207.0 lb

## 2017-02-21 DIAGNOSIS — J101 Influenza due to other identified influenza virus with other respiratory manifestations: Secondary | ICD-10-CM

## 2017-02-21 DIAGNOSIS — R0981 Nasal congestion: Secondary | ICD-10-CM | POA: Diagnosis not present

## 2017-02-21 DIAGNOSIS — R000 Tachycardia, unspecified: Secondary | ICD-10-CM

## 2017-02-21 MED ORDER — HYDROCOD POLST-CPM POLST ER 10-8 MG/5ML PO SUER: 5.0000 mL | Freq: Every evening | ORAL | 0 refills | Status: DC | PRN

## 2017-02-21 MED ORDER — OSELTAMIVIR PHOSPHATE 75 MG PO CAPS: 75.0000 mg | ORAL_CAPSULE | Freq: Two times a day (BID) | ORAL | 0 refills | Status: DC

## 2017-02-21 NOTE — Patient Instructions (Signed)

## 2017-02-21 NOTE — Progress Notes (Signed)
Subjective:    Patient ID: Brittany Davila, female    DOB: 01/11/1976, 42 y.o.   MRN: [id number]  HPI  Pt presents to the clinic today with c/o nasal congestion, ear pain and cough. This started 2 days ago. She describes the ear pain as achy. She denies decreased hearing. She is blowing clear mucous out of her nose. The cough is productive of green mucous. She has run fever's up to 101, had chills and body aches. She has tried Tylenol and Mucinex with minimal relief. She has not had sick contacts but was in the ER 3 days ago.  Review of Systems  Past Medical History:  Diagnosis Date  . Anxiety   . Basal cell carcinoma (BCC) of face   . Bell's palsy    H/O  . Bipolar 1 disorder ([place])   . Depression   . Headache    MIGRAINES  . History of genital warts     Current Outpatient Medications  Medication Sig Dispense Refill  . ALPRAZolam (XANAX) 1 MG tablet Take 1 mg by mouth 2 (two) times daily as needed for anxiety.    . cyclobenzaprine (FEXMID) 7.5 MG tablet Take 1 tablet (7.5 mg total) by mouth 3 (three) times daily as needed for muscle spasms. 30 tablet 0  . Eszopiclone 3 MG TABS Take 3 mg by mouth at bedtime.  0  . lamoTRIgine (LAMICTAL) 100 MG tablet Take 100 mg by mouth at bedtime.  5   No current facility-administered medications for this visit.     No Known Allergies  Family History  Problem Relation Age of Onset  . Diabetes Maternal Grandfather   . Breast cancer Paternal Grandmother   . Colon cancer Paternal Grandfather   . Pancreatic cancer Paternal Grandfather   . Lung cancer Maternal Aunt     Social History   Socioeconomic History  . Marital status: Married    Spouse name: Not on file  . Number of children: Not on file  . Years of education: Not on file  . Highest education level: Not on file  Social Needs  . Financial resource strain: Not on file  . Food insecurity - worry: Not on file  . Food insecurity - inability: Not on file  . Transportation needs -  medical: Not on file  . Transportation needs - non-medical: Not on file  Occupational History  . Not on file  Tobacco Use  . Smoking status: Never Smoker  . Smokeless tobacco: Never Used  Substance and Sexual Activity  . Alcohol use: Yes    Alcohol/week: 0.0 oz    Comment: occasional  . Drug use: No  . Sexual activity: No  Other Topics Concern  . Not on file  Social History Narrative  . Not on file     Constitutional: Pt reports fever. Denies malaise, fatigue, headache or abrupt weight changes.  HEENT: Pt reports nasal congestion, ear pain. Denies eye pain, eye redness, ear pain, ringing in the ears, wax buildup, runny nose, bloody nose, or sore throat. Respiratory: Pt reports cough. Denies difficulty breathing, shortness of breath, or sputum production.    No other specific complaints in a complete review of systems (except as listed in HPI above).     Objective:   Physical Exam   BP 100/68   Pulse (!) 140   Temp 98.9 F (37.2 C) (Oral)   Wt 207 lb (93.9 kg)   LMP 02/18/2017 (Exact Date)   SpO2 97%  BMI 30.57 kg/m  Wt Readings from Last 3 Encounters:  02/21/17 207 lb (93.9 kg)  02/18/17 203 lb (92.1 kg)  12/25/16 215 lb (97.5 kg)    General: Appears her stated age, in NAD. Skin: Pale and clammy HEENT: Ears: Tm's gray and intact, normal light reflex; Nose: mucosa pink and moist, septum midline; Throat/Mouth: Teeth present, mucosa pink and moist, no exudate, lesions or ulcerations noted.  Neck:  No adenopathy noted. Cardiovascular: Tachycardic with normal rhythm. S1,S2 noted.  No murmur, rubs or gallops noted. Pulmonary/Chest: Normal effort and positive vesicular breath sounds. No respiratory distress. No wheezes, rales or ronchi noted.    BMET    Component Value Date/Time   NA 139 02/18/2017 1320   K 4.0 02/18/2017 1320   CL 107 02/18/2017 1320   CO2 23 02/18/2017 1320   GLUCOSE 100 (H) 02/18/2017 1320   BUN 15 02/18/2017 1320   CREATININE 0.92  02/18/2017 1320   CALCIUM 9.4 02/18/2017 1320   GFRNONAA >60 02/18/2017 1320   GFRAA >60 02/18/2017 1320    Lipid Panel  No results found for: CHOL, TRIG, HDL, CHOLHDL, VLDL, LDLCALC  CBC    Component Value Date/Time   WBC 7.7 02/18/2017 1320   RBC 4.60 02/18/2017 1320   HGB 14.1 02/18/2017 1320   HCT 41.6 02/18/2017 1320   PLT 287 02/18/2017 1320   MCV 90.5 02/18/2017 1320   MCH 30.6 02/18/2017 1320   MCHC 33.8 02/18/2017 1320   RDW 13.5 02/18/2017 1320    Hgb A1C No results found for: HGBA1C         Assessment & Plan:   Influenza A, Tachycardia:  Rapid Flu: positive ECG today, sinus tachycardia eRx for Tamiflu 75 mg BID x 5 days eRx for Tussionex cough syrup Encouraged her to rest and stay well hydrated Discussed ER precautions  Return precautions discussed Webb Silversmith, NP

## 2017-02-22 LAB — POC INFLUENZA A&B (BINAX/QUICKVUE)
Influenza A, POC: POSITIVE — AB
Influenza B, POC: NEGATIVE

## 2017-02-22 NOTE — Addendum Note (Signed)
Addended by: Lurlean Nanny on: 02/22/2017 08:48 AM   Modules accepted: Orders

## 2017-02-26 ENCOUNTER — Encounter: Payer: Self-pay | Admitting: Internal Medicine

## 2017-02-26 ENCOUNTER — Ambulatory Visit: Payer: BC Managed Care – PPO | Admitting: Internal Medicine

## 2017-02-26 VITALS — BP 104/76 | HR 90 | Temp 97.7°F | Wt 203.1 lb

## 2017-02-26 DIAGNOSIS — R0789 Other chest pain: Secondary | ICD-10-CM | POA: Diagnosis not present

## 2017-02-26 DIAGNOSIS — M5412 Radiculopathy, cervical region: Secondary | ICD-10-CM | POA: Diagnosis not present

## 2017-02-26 DIAGNOSIS — J101 Influenza due to other identified influenza virus with other respiratory manifestations: Secondary | ICD-10-CM

## 2017-02-26 NOTE — Progress Notes (Signed)
Subjective:    Patient ID: Brittany Davila, female    DOB: 1975-02-13, 42 y.o.   MRN: [id number]  HPI  Pt presents to the clinic today for ER follow up. She went to the ER 02/18/17 with c/o paresthesia of BUE, mainly in the right, associated with an abnormal tightening sensation in her right upper chest. ECG was normal. Chest xray was negative. Cervical xray was negative. Labs were unremarkable. She was given a RX for Flexeril and advised to follow up with her PCP. Since that time, she reports she feels very fatigued but she was diagnosed with the flu. She is still taking her Tamiflu. She saw her chiropractor, which resolved the tingling in her extremities. She has not had any chest pain, chest tightness or shortness of breath. She reports they mentioned follow up with a MRI at the hospital, but she does not want an MRI at this time.   Review of Systems  Past Medical History:  Diagnosis Date  . Anxiety   . Basal cell carcinoma (BCC) of face   . Bell's palsy    H/O  . Bipolar 1 disorder ([place])   . Depression   . Headache    MIGRAINES  . History of genital warts     Current Outpatient Medications  Medication Sig Dispense Refill  . ALPRAZolam (XANAX) 1 MG tablet Take 1 mg by mouth 2 (two) times daily as needed for anxiety.    . chlorpheniramine-HYDROcodone (TUSSIONEX PENNKINETIC ER) 10-8 MG/5ML SUER Take 5 mLs by mouth at bedtime as needed. 140 mL 0  . cyclobenzaprine (FEXMID) 7.5 MG tablet Take 1 tablet (7.5 mg total) by mouth 3 (three) times daily as needed for muscle spasms. 30 tablet 0  . Eszopiclone 3 MG TABS Take 3 mg by mouth at bedtime.  0  . lamoTRIgine (LAMICTAL) 100 MG tablet Take 100 mg by mouth at bedtime.  5  . oseltamivir (TAMIFLU) 75 MG capsule Take 1 capsule (75 mg total) by mouth 2 (two) times daily. 10 capsule 0   No current facility-administered medications for this visit.     No Known Allergies  Family History  Problem Relation Age of Onset  . Diabetes Maternal  Grandfather   . Breast cancer Paternal Grandmother   . Colon cancer Paternal Grandfather   . Pancreatic cancer Paternal Grandfather   . Lung cancer Maternal Aunt     Social History   Socioeconomic History  . Marital status: Married    Spouse name: Not on file  . Number of children: Not on file  . Years of education: Not on file  . Highest education level: Not on file  Social Needs  . Financial resource strain: Not on file  . Food insecurity - worry: Not on file  . Food insecurity - inability: Not on file  . Transportation needs - medical: Not on file  . Transportation needs - non-medical: Not on file  Occupational History  . Not on file  Tobacco Use  . Smoking status: Never Smoker  . Smokeless tobacco: Never Used  Substance and Sexual Activity  . Alcohol use: Yes    Alcohol/week: 0.0 oz    Comment: occasional  . Drug use: No  . Sexual activity: No  Other Topics Concern  . Not on file  Social History Narrative  . Not on file     Constitutional: Pt reports fatigue. Denies fever, malaise,headache or abrupt weight changes.  HEENT: Denies eye pain, eye redness, ear pain, ringing  in the ears, wax buildup, runny nose, nasal congestion, bloody nose, or sore throat. Respiratory: Pt reports cough. Denies difficulty breathing, shortness of breath, or sputum production.   Cardiovascular: Denies chest pain, chest tightness, palpitations or swelling in the hands or feet.  Musculoskeletal: Denies decrease in range of motion, difficulty with gait, muscle pain or joint pain and swelling.  Neurological: Denies dizziness, difficulty with memory, difficulty with speech or problems with balance and coordination.    No other specific complaints in a complete review of systems (except as listed in HPI above).     Objective:   Physical Exam   BP 104/76 (BP Location: Right Arm, Patient Position: Sitting, Cuff Size: Normal)   Pulse 90   Temp 97.7 F (36.5 C) (Oral)   Wt 203 lb 1.9 oz  (92.1 kg)   LMP 02/18/2017 (Exact Date)   SpO2 99%   BMI 30.00 kg/m  Wt Readings from Last 3 Encounters:  02/26/17 203 lb 1.9 oz (92.1 kg)  02/21/17 207 lb (93.9 kg)  02/18/17 203 lb (92.1 kg)    General: Appears her stated age, in NAD. Cardiovascular: Normal rate and rhythm. S1,S2 noted.  No murmur, rubs or gallops noted. Radial pulses 2+ bilaterally. Pulmonary/Chest: Normal effort and positive vesicular breath sounds. No respiratory distress. No wheezes, rales or ronchi noted.  Musculoskeletal: Normal flexion, extension, rotation and lateral bending of the cervical spine. No bony tenderness noted over the spine. Tender to palpation of the right trapezius. Strength 5/5 BUE. Neurological: Alert and oriented. Fine motor coordination normal. Negative Phalen's. Negative Tinel's.   BMET    Component Value Date/Time   NA 139 02/18/2017 1320   K 4.0 02/18/2017 1320   CL 107 02/18/2017 1320   CO2 23 02/18/2017 1320   GLUCOSE 100 (H) 02/18/2017 1320   BUN 15 02/18/2017 1320   CREATININE 0.92 02/18/2017 1320   CALCIUM 9.4 02/18/2017 1320   GFRNONAA >60 02/18/2017 1320   GFRAA >60 02/18/2017 1320    Lipid Panel  No results found for: CHOL, TRIG, HDL, CHOLHDL, VLDL, LDLCALC  CBC    Component Value Date/Time   WBC 7.7 02/18/2017 1320   RBC 4.60 02/18/2017 1320   HGB 14.1 02/18/2017 1320   HCT 41.6 02/18/2017 1320   PLT 287 02/18/2017 1320   MCV 90.5 02/18/2017 1320   MCH 30.6 02/18/2017 1320   MCHC 33.8 02/18/2017 1320   RDW 13.5 02/18/2017 1320    Hgb A1C No results found for: HGBA1C         Assessment & Plan:   ER Follow Up for Cervical Radiculopathy, Other Chest Pain:  ER notes, labs and imaging reviewed Symptoms have improved She can not take NSAID's due to history of gastritis Continue Flexeril at night Stretching exercises given Alternate heat and ice Will hold off on MRI at this time, she will let me know if symptoms worsen  Flu:  Finish your course  of Tamiflu Discussed through hand washing and covering her mouth when she coughs  Return precautions discussed Webb Silversmith, NP

## 2017-02-26 NOTE — Patient Instructions (Signed)
Cervical Radiculopathy  Cervical radiculopathy means that a nerve in the neck is pinched or bruised. This can cause pain or loss of feeling (numbness) that runs from your neck to your arm and fingers.  Follow these instructions at home:  Managing pain  ? Take over-the-counter and prescription medicines only as told by your doctor.  ? If directed, put ice on the injured or painful area.  ? Put ice in a plastic bag.  ? Place a towel between your skin and the bag.  ? Leave the ice on for 20 minutes, 2?3 times per day.  ? If ice does not help, you can try using heat. Take a warm shower or warm bath, or use a heat pack as told by your doctor.  ? You may try a gentle neck and shoulder massage.  Activity  ? Rest as needed. Follow instructions from your doctor about any activities to avoid.  ? Do exercises as told by your doctor or physical therapist.  General instructions  ? If you were given a soft collar, wear it as told by your doctor.  ? Use a flat pillow when you sleep.  ? Keep all follow-up visits as told by your doctor. This is important.  Contact a doctor if:  ? Your condition does not improve with treatment.  Get help right away if:  ? Your pain gets worse and is not controlled with medicine.  ? You lose feeling or feel weak in your hand, arm, face, or leg.  ? You have a fever.  ? You have a stiff neck.  ? You cannot control when you poop or pee (have incontinence).  ? You have trouble with walking, balance, or talking.  This information is not intended to replace advice given to you by your health care provider. Make sure you discuss any questions you have with your health care provider.  Document Released: 01/05/2011 Document Revised: 06/24/2015 Document Reviewed: 03/12/2014  [employer]vier Interactive Patient Education ? 2018 [employer]

## 2017-03-05 ENCOUNTER — Encounter: Payer: Self-pay | Admitting: Family Medicine

## 2017-03-05 ENCOUNTER — Ambulatory Visit: Payer: BC Managed Care – PPO | Admitting: Family Medicine

## 2017-03-05 ENCOUNTER — Other Ambulatory Visit: Payer: Self-pay

## 2017-03-05 ENCOUNTER — Encounter: Payer: Self-pay | Admitting: *Deleted

## 2017-03-05 ENCOUNTER — Ambulatory Visit: Payer: Self-pay | Admitting: *Deleted

## 2017-03-05 VITALS — BP 110/70 | HR 114 | Temp 99.4°F | Ht 69.0 in | Wt 204.0 lb

## 2017-03-05 DIAGNOSIS — R6889 Other general symptoms and signs: Secondary | ICD-10-CM

## 2017-03-05 DIAGNOSIS — R52 Pain, unspecified: Secondary | ICD-10-CM

## 2017-03-05 DIAGNOSIS — R6883 Chills (without fever): Secondary | ICD-10-CM | POA: Diagnosis not present

## 2017-03-05 LAB — POC INFLUENZA A&B (BINAX/QUICKVUE)
INFLUENZA A, POC: NEGATIVE
INFLUENZA B, POC: NEGATIVE

## 2017-03-05 NOTE — Progress Notes (Signed)
Dr. Frederico Hamman T. Jazzma Neidhardt, MD, [facility] [place], [address] Phone: [phone] Fax: [phone]  03/05/2017  Patient: Brittany Davila, MRN: [id number], DOB: April 02, 1975, 42 y.o.  Primary Physician:  Jearld Fenton, NP   Chief Complaint  Patient presents with  . Sore Throat    Dx with flu 2 weeks ago & now feels the same  . Nasal Drip  . Pressure in ears  . Chills  . Generalized Body Aches   Subjective:   Brittany Davila presents with runny nose, sneezing, cough, sore throat, malaise, myalgias, arthralgia, chills, and fever.  Tired, throat hurts, pressure in her ear. Some dripping from nose.   + recent exposure to others with similar symptoms.   The patent denies sore throat as the primary complaint. Denies sthortness of breath/wheezing, otalgia, facial pain, abdominal pain, changes in bowel or bladder.  Generally feels terrible  Tmax: ?  PMH, PHS, Allergies, Problem List, Medications, Family History, and Social History have all been reviewed.  Patient Active Problem List   Diagnosis Date Noted  . Anxiety and depression 05/15/2016  . Bipolar 1 disorder ([place]) 05/15/2016  . Insomnia 05/15/2016    Past Medical History:  Diagnosis Date  . Anxiety   . Basal cell carcinoma (BCC) of face   . Bell's palsy    H/O  . Bipolar 1 disorder ([place])   . Depression   . Headache    MIGRAINES  . History of genital warts     Past Surgical History:  Procedure Laterality Date  . BREAST CYST ASPIRATION Left   . LAPAROSCOPIC TUBAL LIGATION Bilateral 11/10/2016   Procedure: LAPAROSCOPIC TUBAL LIGATION;  Surgeon: Benjaman Kindler, MD;  Location: [facility] ORS;  Service: Gynecology;  Laterality: Bilateral;  . TONSILLECTOMY AND ADENOIDECTOMY      Social History   Socioeconomic History  . Marital status: Married    Spouse name: Not on file  . Number of children: Not on file  . Years of education: Not on file  . Highest education  level: Not on file  Social Needs  . Financial resource strain: Not on file  . Food insecurity - worry: Not on file  . Food insecurity - inability: Not on file  . Transportation needs - medical: Not on file  . Transportation needs - non-medical: Not on file  Occupational History  . Not on file  Tobacco Use  . Smoking status: Never Smoker  . Smokeless tobacco: Never Used  Substance and Sexual Activity  . Alcohol use: Yes    Alcohol/week: 0.0 oz    Comment: occasional  . Drug use: No  . Sexual activity: No  Other Topics Concern  . Not on file  Social History Narrative  . Not on file    Family History  Problem Relation Age of Onset  . Diabetes Maternal Grandfather   . Breast cancer Paternal Grandmother   . Colon cancer Paternal Grandfather   . Pancreatic cancer Paternal Grandfather   . Lung cancer Maternal Aunt     Allergies  Allergen Reactions  . Pollen Extract Other (See Comments)    Medication list reviewed and updated in full in [place].  ROS as above, eating and drinking - tolerating PO. Urinating normally. No excessive vomitting or diarrhea. O/w as above.  Objective:   Blood pressure 110/70, pulse (!) 114, temperature 99.4 F (37.4 C), temperature source Oral, height 5\' 9"  (1.753 m), weight 204  lb (92.5 kg), last menstrual period 02/18/2017, SpO2 97 %.  Gen: WDWN, NAD; A & O x3, cooperative. Pleasant.Globally Non-toxic HEENT: Normocephalic and atraumatic. Throat clear, w/o exudate, R TM clear, L TM - good landmarks, No fluid present. rhinnorhea. No frontal or maxillary sinus T. MMM NECK: Anterior cervical  LAD is absent CV: RRR, No M/G/R, cap refill <2 sec PULM: Breathing comfortably in no respiratory distress. no wheezing, crackles, rhonchi ABD: S,NT,ND,+BS. No HSM. No rebound. EXT: No c/c/e PSYCH: Friendly, good eye contact MSK: Nml gait  Results for orders placed or performed in visit on 03/05/17  POC Influenza A&B (Binax test)  Result Value  Ref Range   Influenza A, POC Negative Negative   Influenza B, POC Negative Negative    Assessment and Plan:   Flu-like symptoms  Chills - Plan: POC Influenza A&B (Binax test)  Body aches - Plan: POC Influenza A&B (Binax test)   Supportive care, fluids, cough medicines as needed, and anti-pyretics. Infection control emphasized, including OOW or school until AF 24 hours.  Follow-up: No Follow-up on file.  Orders Placed This Encounter  Procedures  . POC Influenza A&B (Binax test)    Signed,  Mayli Covington T. Kaoir Loree, MD   Patient's Medications  New Prescriptions   No medications on file  Previous Medications   ALPRAZOLAM (XANAX) 1 MG TABLET    Take 1 mg by mouth 2 (two) times daily as needed for anxiety.   ESZOPICLONE 3 MG TABS    Take 3 mg by mouth at bedtime.   LAMOTRIGINE (LAMICTAL) 100 MG TABLET    Take 100 mg by mouth at bedtime.  Modified Medications   No medications on file  Discontinued Medications   CHLORPHENIRAMINE-HYDROCODONE (TUSSIONEX PENNKINETIC ER) 10-8 MG/5ML SUER    Take 5 mLs by mouth at bedtime as needed.   CYCLOBENZAPRINE (FEXMID) 7.5 MG TABLET    Take 1 tablet (7.5 mg total) by mouth 3 (three) times daily as needed for muscle spasms.

## 2017-03-05 NOTE — Telephone Encounter (Signed)
Patient started with sore throat today- she is having chills as well. She states she has slight pressure in her ears. She states if she can't be seen today - she will go to walk in - she feels that bad. Appointment today. Reason for Disposition . Earache also present  Answer Assessment - Initial Assessment Questions 1. ONSET: "When did the throat start hurting?" (Hours or days ago)      Early this morning 2. SEVERITY: "How bad is the sore throat?" (Scale 1-10; mild, moderate or severe)   - MILD (1-3):  doesn't interfere with eating or normal activities   - MODERATE (4-7): interferes with eating some solids and normal activities   - SEVERE (8-10):  excruciating pain, interferes with most normal activities   - SEVERE DYSPHAGIA: can't swallow liquids, drooling     5- more raw feeling 3. STREP EXPOSURE: "Has there been any exposure to strep within the past week?" If so, ask: "What type of contact occurred?"      No- works with public 4.  VIRAL SYMPTOMS: "Are there any symptoms of a cold, such as a runny nose, cough, hoarse voice or red eyes?"      Patient had flu- 1/23 and was treated, some nasal drainage- not bad 5. FEVER: "Do you have a fever?" If so, ask: "What is your temperature, how was it measured, and when did it start?"     Unknown- patient has started having cold chills 6. PUS ON THE TONSILS: "Is there pus on the tonsils in the back of your throat?"     Daughter also has sore throat- her throat is red and irritated ( started yesterday) patient has not looked at her own throat 7. OTHER SYMPTOMS: "Do you have any other symptoms?" (e.g., difficulty breathing, headache, rash)     Chest pressure 8. PREGNANCY: "Is there any chance you are pregnant?" "When was your last menstrual period?"     No- BTL  Protocols used: SORE THROAT-A-AH

## 2017-03-05 NOTE — Telephone Encounter (Signed)
Pt has appt 03/05/17 at 3:20 with Dr Lorelei Pont.

## 2017-03-08 ENCOUNTER — Encounter: Payer: Self-pay | Admitting: Internal Medicine

## 2017-03-08 ENCOUNTER — Ambulatory Visit (INDEPENDENT_AMBULATORY_CARE_PROVIDER_SITE_OTHER): Payer: BC Managed Care – PPO | Admitting: Internal Medicine

## 2017-03-08 VITALS — BP 112/76 | HR 91 | Temp 98.5°F | Ht 68.0 in | Wt 202.0 lb

## 2017-03-08 DIAGNOSIS — Z0000 Encounter for general adult medical examination without abnormal findings: Secondary | ICD-10-CM

## 2017-03-08 DIAGNOSIS — F419 Anxiety disorder, unspecified: Secondary | ICD-10-CM | POA: Diagnosis not present

## 2017-03-08 DIAGNOSIS — J00 Acute nasopharyngitis [common cold]: Secondary | ICD-10-CM

## 2017-03-08 DIAGNOSIS — F32A Depression, unspecified: Secondary | ICD-10-CM

## 2017-03-08 DIAGNOSIS — F319 Bipolar disorder, unspecified: Secondary | ICD-10-CM | POA: Diagnosis not present

## 2017-03-08 DIAGNOSIS — F5104 Psychophysiologic insomnia: Secondary | ICD-10-CM | POA: Diagnosis not present

## 2017-03-08 DIAGNOSIS — F329 Major depressive disorder, single episode, unspecified: Secondary | ICD-10-CM

## 2017-03-08 LAB — CBC
HCT: 41.4 % (ref 36.0–46.0)
Hemoglobin: 13.7 g/dL (ref 12.0–15.0)
MCHC: 33 g/dL (ref 30.0–36.0)
MCV: 91 fl (ref 78.0–100.0)
PLATELETS: 330 10*3/uL (ref 150.0–400.0)
RBC: 4.55 Mil/uL (ref 3.87–5.11)
RDW: 13.2 % (ref 11.5–15.5)
WBC: 11.5 10*3/uL — AB (ref 4.0–10.5)

## 2017-03-08 LAB — COMPREHENSIVE METABOLIC PANEL
ALBUMIN: 4.2 g/dL (ref 3.5–5.2)
ALT: 11 U/L (ref 0–35)
AST: 14 U/L (ref 0–37)
Alkaline Phosphatase: 59 U/L (ref 39–117)
BILIRUBIN TOTAL: 0.3 mg/dL (ref 0.2–1.2)
BUN: 10 mg/dL (ref 6–23)
CALCIUM: 9.3 mg/dL (ref 8.4–10.5)
CO2: 24 mEq/L (ref 19–32)
CREATININE: 0.82 mg/dL (ref 0.40–1.20)
Chloride: 108 mEq/L (ref 96–112)
GFR: 81.93 mL/min (ref >60.00)
Glucose, Bld: 83 mg/dL (ref 70–99)
Potassium: 3.8 mEq/L (ref 3.5–5.1)
Sodium: 141 mEq/L (ref 135–145)
Total Protein: 7.5 g/dL (ref 6.0–8.3)

## 2017-03-08 LAB — HEMOGLOBIN A1C: HEMOGLOBIN A1C: 5.7 % (ref 4.6–6.5)

## 2017-03-08 LAB — LIPID PANEL
CHOL/HDL RATIO: 4
Cholesterol: 165 mg/dL (ref 0–200)
HDL: 41.5 mg/dL (ref >39.00)
LDL Cholesterol: 91 mg/dL (ref 0–99)
NonHDL: 123.4
TRIGLYCERIDES: 162 mg/dL — AB (ref 0.0–149.0)
VLDL: 32.4 mg/dL (ref 0.0–40.0)

## 2017-03-08 LAB — TSH: TSH: 2.22 u[IU]/mL (ref 0.35–4.50)

## 2017-03-08 LAB — VITAMIN D 25 HYDROXY (VIT D DEFICIENCY, FRACTURES): VITD: 23.62 ng/mL — AB (ref 30.00–100.00)

## 2017-03-08 MED ORDER — AZITHROMYCIN 250 MG PO TABS: ORAL_TABLET | ORAL | 0 refills | Status: DC

## 2017-03-08 NOTE — Assessment & Plan Note (Signed)
Stable on Lamictal She will continue to follow with psych

## 2017-03-08 NOTE — Progress Notes (Signed)
Subjective:    Patient ID: Brittany Davila, female    DOB: 09/04/1975, 42 y.o.   MRN: [id number]  HPI  Pt presents to the clinic today for her annual exam. She is also due to follow up chronic conditions.  Anxiety and Depression: Triggered by general life stress. She is taking Xanax as prescribed. She follows with Dr. Rosilyn Mings in [place].  Bipolar 1 Disorder: Chronic but stable on Lamictal. She follows with Dr. Rosilyn Mings in [place].  Insomnia: She can fall asleep but has trouble staying asleep. She averaged 5 hours a night, with the use of Lunesta.   She also reports persistent cough and chest congestion. This started Monday. The cough is producitve of bloody, brown mucous. She denies shortness of breath. She denies fevers, but has had chills, body aches and fatigue. She has not taken anyOTC meds. She saw Dr. Lorelei Pont Tuesday, tested negative for the flu (although she had a positive flu test with treatment on 02/21/17). He advised her to treat symptomatically with OTC products.  Flu: 10/2016 Tetanus: > 10 years ago Pap Smear: 04/2016 Mammogram: 05/2016 Vision Screening: annually Dentist: biannually  Diet: She does eat meat. She consumes more veggies than fruits. She does eat some fried foods. She drinks mostly soda or sweet tea. Exercise: None  Review of Systems      Past Medical History:  Diagnosis Date  . Anxiety   . Basal cell carcinoma (BCC) of face   . Bell's palsy    H/O  . Bipolar 1 disorder ([place])   . Depression   . Headache    MIGRAINES  . History of genital warts     Current Outpatient Medications  Medication Sig Dispense Refill  . ALPRAZolam (XANAX) 1 MG tablet Take 1 mg by mouth 2 (two) times daily as needed for anxiety.    . Eszopiclone 3 MG TABS Take 3 mg by mouth at bedtime.  0  . lamoTRIgine (LAMICTAL) 100 MG tablet Take 100 mg by mouth at bedtime.  5   No current facility-administered medications for this visit.     Allergies  Allergen Reactions  . Pollen  Extract Other (See Comments)    Family History  Problem Relation Age of Onset  . Diabetes Maternal Grandfather   . Breast cancer Paternal Grandmother   . Colon cancer Paternal Grandfather   . Pancreatic cancer Paternal Grandfather   . Lung cancer Maternal Aunt     Social History   Socioeconomic History  . Marital status: Married    Spouse name: Not on file  . Number of children: Not on file  . Years of education: Not on file  . Highest education level: Not on file  Social Needs  . Financial resource strain: Not on file  . Food insecurity - worry: Not on file  . Food insecurity - inability: Not on file  . Transportation needs - medical: Not on file  . Transportation needs - non-medical: Not on file  Occupational History  . Not on file  Tobacco Use  . Smoking status: Never Smoker  . Smokeless tobacco: Never Used  Substance and Sexual Activity  . Alcohol use: Yes    Alcohol/week: 0.0 oz    Comment: occasional  . Drug use: No  . Sexual activity: No  Other Topics Concern  . Not on file  Social History Narrative  . Not on file     Constitutional: Pt reports fatigue. Denies fever, malaise, headache or abrupt weight changes.  HEENT:  Denies eye pain, eye redness, ear pain, ringing in the ears, wax buildup, runny nose, nasal congestion, bloody nose, or sore throat. Respiratory: Pt reports cough. Denies difficulty breathing, shortness of breath.   Cardiovascular: Denies chest pain, chest tightness, palpitations or swelling in the hands or feet.  Gastrointestinal: Pt reports constipation. Denies abdominal pain, bloating, diarrhea or blood in the stool.  GU: Denies urgency, frequency, pain with urination, burning sensation, blood in urine, odor or discharge. Musculoskeletal: Denies decrease in range of motion, difficulty with gait, muscle pain or joint pain and swelling.  Skin: Denies redness, rashes, lesions or ulcercations.  Neurological: Denies dizziness, difficulty with  memory, difficulty with speech or problems with balance and coordination.  Psych: Pt has a history of anxiety and depression. Denies SI/HI.  No other specific complaints in a complete review of systems (except as listed in HPI above).  Objective:   Physical Exam   BP 112/76   Pulse 91   Temp 98.5 F (36.9 C) (Oral)   Ht 5\' 8"  (1.727 m)   Wt 202 lb (91.6 kg)   LMP 02/18/2017 (Exact Date)   SpO2 98%   BMI 30.71 kg/m  Wt Readings from Last 3 Encounters:  03/08/17 202 lb (91.6 kg)  03/05/17 204 lb (92.5 kg)  02/26/17 203 lb 1.9 oz (92.1 kg)    General: Appears her stated age, well developed, well nourished in NAD. Skin: Warm, dry and intact.  HEENT: Head: normal shape and size; Eyes: sclera white, no icterus, conjunctiva pink, PERRLA and EOMs intact; Ears: Tm's gray and intact, normal light reflex; Throat/Mouth: Teeth present, mucosa pink and moist, no exudate, lesions or ulcerations noted.  Neck:  Neck supple, trachea midline. No masses, lumps or thyromegaly present.  Cardiovascular: Normal rate and rhythm. S1,S2 noted.  No murmur, rubs or gallops noted. No JVD or BLE edema. Pulmonary/Chest: Normal effort and positive vesicular breath sounds. No respiratory distress. No wheezes, rales or ronchi noted.  Abdomen: Soft and nontender. Normal bowel sounds. No distention or masses noted. Liver, spleen and kidneys non palpable. Musculoskeletal: Strength 5/5 BUE/BLE. No difficulty with gait.  Neurological: Alert and oriented. Cranial nerves II-XII grossly intact. Coordination normal.  Psychiatric: Mood and affect normal. Behavior is normal. Judgment and thought content normal.    BMET    Component Value Date/Time   NA 139 02/18/2017 1320   K 4.0 02/18/2017 1320   CL 107 02/18/2017 1320   CO2 23 02/18/2017 1320   GLUCOSE 100 (H) 02/18/2017 1320   BUN 15 02/18/2017 1320   CREATININE 0.92 02/18/2017 1320   CALCIUM 9.4 02/18/2017 1320   GFRNONAA >60 02/18/2017 1320   GFRAA >60  02/18/2017 1320    Lipid Panel  No results found for: CHOL, TRIG, HDL, CHOLHDL, VLDL, LDLCALC  CBC    Component Value Date/Time   WBC 7.7 02/18/2017 1320   RBC 4.60 02/18/2017 1320   HGB 14.1 02/18/2017 1320   HCT 41.6 02/18/2017 1320   PLT 287 02/18/2017 1320   MCV 90.5 02/18/2017 1320   MCH 30.6 02/18/2017 1320   MCHC 33.8 02/18/2017 1320   RDW 13.5 02/18/2017 1320    Hgb A1C No results found for: HGBA1C         Assessment & Plan:   Preventative Health Maintenance:  Flu shot UTD She declines tetanus booster She will schedule her mammogram for 05/2016 Pap smear due 2021 Encouraged her to consume a balanced diet and exercise regimen Advised her to see an eye  doctor and dentist annually Will check CBC, CMET, TSH, Lipid, A1C and Vit D today  Upper Respiratory Infection:  Get some rest and drink plenty of water eRx for Azithromax x 5 days She has cough syrup that was previously prescribed that she can take  RTC in 1 year, sooner if needed Webb Silversmith, NP

## 2017-03-08 NOTE — Assessment & Plan Note (Signed)
Continue Lunesta She will continue to follow with psych

## 2017-03-08 NOTE — Patient Instructions (Signed)

## 2017-03-08 NOTE — Assessment & Plan Note (Signed)
Stable on Xanax She will continue to follow with psych

## 2017-05-09 ENCOUNTER — Other Ambulatory Visit: Payer: Self-pay | Admitting: Obstetrics and Gynecology

## 2017-05-09 DIAGNOSIS — Z1231 Encounter for screening mammogram for malignant neoplasm of breast: Secondary | ICD-10-CM

## 2017-06-19 ENCOUNTER — Ambulatory Visit
Admission: RE | Admit: 2017-06-19 | Discharge: 2017-06-19 | Disposition: A | Discharge status: Home / Self Care | Payer: BC Managed Care – PPO | Source: Ambulatory Visit | Attending: Obstetrics and Gynecology | Admitting: Obstetrics and Gynecology

## 2017-06-19 DIAGNOSIS — Z1231 Encounter for screening mammogram for malignant neoplasm of breast: Secondary | ICD-10-CM | POA: Diagnosis present

## 2017-06-26 ENCOUNTER — Other Ambulatory Visit: Payer: Self-pay | Admitting: Obstetrics and Gynecology

## 2017-06-26 DIAGNOSIS — R921 Mammographic calcification found on diagnostic imaging of breast: Secondary | ICD-10-CM

## 2017-06-26 DIAGNOSIS — R928 Other abnormal and inconclusive findings on diagnostic imaging of breast: Secondary | ICD-10-CM

## 2017-06-26 DIAGNOSIS — N6310 Unspecified lump in the right breast, unspecified quadrant: Secondary | ICD-10-CM

## 2017-06-27 ENCOUNTER — Ambulatory Visit
Admission: RE | Admit: 2017-06-27 | Discharge: 2017-06-27 | Disposition: A | Discharge status: Home / Self Care | Payer: BC Managed Care – PPO | Source: Ambulatory Visit | Attending: Obstetrics and Gynecology | Admitting: Obstetrics and Gynecology

## 2017-06-27 DIAGNOSIS — R928 Other abnormal and inconclusive findings on diagnostic imaging of breast: Secondary | ICD-10-CM

## 2017-06-27 DIAGNOSIS — N6310 Unspecified lump in the right breast, unspecified quadrant: Secondary | ICD-10-CM

## 2017-06-27 DIAGNOSIS — R921 Mammographic calcification found on diagnostic imaging of breast: Secondary | ICD-10-CM | POA: Diagnosis present

## 2017-08-16 ENCOUNTER — Encounter: Payer: Self-pay | Admitting: Internal Medicine

## 2017-08-16 ENCOUNTER — Ambulatory Visit (INDEPENDENT_AMBULATORY_CARE_PROVIDER_SITE_OTHER)
Admission: RE | Admit: 2017-08-16 | Discharge: 2017-08-16 | Disposition: A | Discharge status: Home / Self Care | Payer: BC Managed Care – PPO | Source: Ambulatory Visit | Attending: Internal Medicine | Admitting: Internal Medicine

## 2017-08-16 ENCOUNTER — Ambulatory Visit: Payer: BC Managed Care – PPO | Admitting: Internal Medicine

## 2017-08-16 VITALS — BP 122/80 | HR 76 | Temp 98.2°F | Wt 203.0 lb

## 2017-08-16 DIAGNOSIS — M79605 Pain in left leg: Secondary | ICD-10-CM | POA: Diagnosis not present

## 2017-08-16 DIAGNOSIS — R202 Paresthesia of skin: Secondary | ICD-10-CM | POA: Diagnosis not present

## 2017-08-16 DIAGNOSIS — K645 Perianal venous thrombosis: Secondary | ICD-10-CM | POA: Diagnosis not present

## 2017-08-16 DIAGNOSIS — M79604 Pain in right leg: Secondary | ICD-10-CM | POA: Diagnosis not present

## 2017-08-16 LAB — BASIC METABOLIC PANEL
BUN: 11 mg/dL (ref 6–23)
CO2: 30 mEq/L (ref 19–32)
Calcium: 9.4 mg/dL (ref 8.4–10.5)
Chloride: 103 mEq/L (ref 96–112)
Creatinine, Ser: 0.97 mg/dL (ref 0.40–1.20)
GFR: 67.34 mL/min (ref >60.00)
Glucose, Bld: 99 mg/dL (ref 70–99)
POTASSIUM: 3.8 meq/L (ref 3.5–5.1)
SODIUM: 139 meq/L (ref 135–145)

## 2017-08-16 LAB — VITAMIN B12: Vitamin B-12: 269 pg/mL (ref 211–911)

## 2017-08-16 LAB — TSH: TSH: 2.15 u[IU]/mL (ref 0.35–4.50)

## 2017-08-16 MED ORDER — CYCLOBENZAPRINE HCL 5 MG PO TABS: 5.0000 mg | ORAL_TABLET | Freq: Every day | ORAL | 0 refills | Status: DC

## 2017-08-16 MED ORDER — HYDROCORTISONE ACE-PRAMOXINE 1-1 % RE FOAM: 1.0000 | Freq: Two times a day (BID) | RECTAL | 0 refills | Status: DC

## 2017-08-16 NOTE — Progress Notes (Signed)
Subjective:    Patient ID: Brittany Davila, female    DOB: [birth year], 42 y.o.   MRN: [id number]  HPI  Pt presents to the clinic today with c/o bilateral leg pain. This started 2 weeks ago. She describes the pain as throbbing, crampy, sharp and shooting. She has some numbness and tingling, but no weakness. She reports this feeling is intermittent in the right leg, constant in the left leg. She denies back pain. She denies any injury to the area. She went to a chiropractor on Monday and now feels like the pain is worse. She denies recent fall secondary to the leg pain. She has tried Tylenol, heat and ice with minimal relief.   She also reports hemorrhoids. Sh has recently seen some blood when she wipes. She has a throbbing, burning when she wipes. She has been intermittently constipated. She denies abdomina pain. She has tried OTC hemorrhoid wipes/cream with minimal relief.  Review of Systems      Past Medical History:  Diagnosis Date  . Anxiety   . Basal cell carcinoma (BCC) of face   . Bell's palsy    H/O  . Bipolar 1 disorder ([place])   . Depression   . Headache    MIGRAINES  . History of genital warts     Current Outpatient Medications  Medication Sig Dispense Refill  . ALPRAZolam (XANAX) 1 MG tablet Take 1 mg by mouth 2 (two) times daily as needed for anxiety.    Marland Kitchen azithromycin (ZITHROMAX) 250 MG tablet Take 2 tabs today, then 1 tab daily x 4 days 6 tablet 0  . Eszopiclone 3 MG TABS Take 3 mg by mouth at bedtime.  0  . lamoTRIgine (LAMICTAL) 100 MG tablet Take 100 mg by mouth at bedtime.  5   No current facility-administered medications for this visit.     Allergies  Allergen Reactions  . Pollen Extract Other (See Comments)    Family History  Problem Relation Age of Onset  . Diabetes Maternal Grandfather   . Breast cancer Paternal Grandmother   . Colon cancer Paternal Grandfather   . Pancreatic cancer Paternal Grandfather   . Lung cancer Maternal Aunt     Social  History   Socioeconomic History  . Marital status: Married    Spouse name: Not on file  . Number of children: Not on file  . Years of education: Not on file  . Highest education level: Not on file  Occupational History  . Not on file  Social Needs  . Financial resource strain: Not on file  . Food insecurity:    Worry: Not on file    Inability: Not on file  . Transportation needs:    Medical: Not on file    Non-medical: Not on file  Tobacco Use  . Smoking status: Never Smoker  . Smokeless tobacco: Never Used  Substance and Sexual Activity  . Alcohol use: Yes    Alcohol/week: 0.0 oz    Comment: occasional  . Drug use: No  . Sexual activity: Never  Lifestyle  . Physical activity:    Days per week: Not on file    Minutes per session: Not on file  . Stress: Not on file  Relationships  . Social connections:    Talks on phone: Not on file    Gets together: Not on file    Attends religious service: Not on file    Active member of club or organization: Not on file  Attends meetings of clubs or organizations: Not on file    Relationship status: Not on file  . Intimate partner violence:    Fear of current or ex partner: Not on file    Emotionally abused: Not on file    Physically abused: Not on file    Forced sexual activity: Not on file  Other Topics Concern  . Not on file  Social History Narrative  . Not on file     Constitutional: Denies fever, malaise, fatigue, headache or abrupt weight changes.  Respiratory: Denies difficulty breathing, shortness of breath, cough or sputum production.   Cardiovascular: Denies chest pain, chest tightness, palpitations or swelling in the hands or feet.  Gastrointestinal: Pt reports hemorrhoids, blood in stool. Denies abdominal pain, bloating, constipation, diarrhea.  Musculoskeletal: Pt reports leg pain. Denies decrease in range of motion, difficulty with gait, or joint pain and swelling.  Skin: Denies redness, rashes, lesions or  ulcercations.  Neurological: Pt reports numbness and tingling of lower extremities. Denies dizziness, difficulty with memory, difficulty with speech or problems with balance and coordination.    No other specific complaints in a complete review of systems (except as listed in HPI above).  Objective:   Physical Exam BP 122/80   Pulse 76   Temp 98.2 F (36.8 C) (Oral)   Wt 203 lb (92.1 kg)   LMP 08/09/2017   SpO2 98%   BMI 30.87 kg/m   Wt Readings from Last 3 Encounters:  08/16/17 203 lb (92.1 kg)  03/08/17 202 lb (91.6 kg)  03/05/17 204 lb (92.5 kg)    General: Appears her stated age, well developed, well nourished in NAD. Skin: Warm, dry and intact. No rashes, lesions or ulcerations noted. Cardiovascular: Normal rate and rhythm. S1,S2 noted.  No murmur, rubs or gallops noted. Pedal pulse 2+ bilaterally. Pulmonary/Chest: Normal effort and positive vesicular breath sounds. No respiratory distress. No wheezes, rales or ronchi noted.  Abdomen: Soft and nontender. Hypoactive bowel sounds. No distention or masses noted.  Rectal: Thrombosed external hemorrhoid noted. Musculoskeletal: Normal flexion, extension and rotation of the spine. No bony tenderness noted over the spine. Normal flexion, extension, abduction, adduction of bilateral hips. Pain with palpation of both calves, but no swelling, redness or warmth noted. Strength 5/5 BLE. Able to walk on heels and toes. No difficulty with gait. Neurological: Alert and oriented. Sensation intact to BLE. Negative SLR bilaterally.   BMET    Component Value Date/Time   NA 141 03/08/2017 1005   K 3.8 03/08/2017 1005   CL 108 03/08/2017 1005   CO2 24 03/08/2017 1005   GLUCOSE 83 03/08/2017 1005   BUN 10 03/08/2017 1005   CREATININE 0.82 03/08/2017 1005   CALCIUM 9.3 03/08/2017 1005   GFRNONAA >60 02/18/2017 1320   GFRAA >60 02/18/2017 1320    Lipid Panel     Component Value Date/Time   CHOL 165 03/08/2017 1005   TRIG 162.0 (H)  03/08/2017 1005   HDL 41.50 03/08/2017 1005   CHOLHDL 4 03/08/2017 1005   VLDL 32.4 03/08/2017 1005   LDLCALC 91 03/08/2017 1005    CBC    Component Value Date/Time   WBC 11.5 (H) 03/08/2017 1005   RBC 4.55 03/08/2017 1005   HGB 13.7 03/08/2017 1005   HCT 41.4 03/08/2017 1005   PLT 330.0 03/08/2017 1005   MCV 91.0 03/08/2017 1005   MCH 30.6 02/18/2017 1320   MCHC 33.0 03/08/2017 1005   RDW 13.2 03/08/2017 1005    Hgb  A1C Lab Results  Component Value Date   HGBA1C 5.7 03/08/2017           Assessment & Plan:   Pain and Paresthesia of BLE:  Exam benign Xray lumbar spine today Will check BMET, TSH, B12 Stretching exercises given eRx for Flexeril 5 mg QHS prn- sedation caution given  External Thrombosed Hemorrhoid:  Discussed consuming adequate water intake Can take a stool softener if needed eRx for Proctofoam BID prn  Return precautions discussed Webb Silversmith, NP

## 2017-08-16 NOTE — Patient Instructions (Signed)
Hemorrhoids Hemorrhoids are swollen veins in and around the rectum or anus. There are two types of hemorrhoids:  Internal hemorrhoids. These occur in the veins that are just inside the rectum. They may poke through to the outside and become irritated and painful.  External hemorrhoids. These occur in the veins that are outside of the anus and can be felt as a painful swelling or hard lump near the anus.  Most hemorrhoids do not cause serious problems, and they can be managed with home treatments such as diet and lifestyle changes. If home treatments do not help your symptoms, procedures can be done to shrink or remove the hemorrhoids. What are the causes? This condition is caused by increased pressure in the anal area. This pressure may result from various things, including:  Constipation.  Straining to have a bowel movement.  Diarrhea.  Pregnancy.  Obesity.  Sitting for long periods of time.  Heavy lifting or other activity that causes you to strain.  Anal sex.  What are the signs or symptoms? Symptoms of this condition include:  Pain.  Anal itching or irritation.  Rectal bleeding.  Leakage of stool (feces).  Anal swelling.  One or more lumps around the anus.  How is this diagnosed? This condition can often be diagnosed through a visual exam. Other exams or tests may also be done, such as:  Examination of the rectal area with a gloved hand (digital rectal exam).  Examination of the anal canal using a small tube (anoscope).  A blood test, if you have lost a significant amount of blood.  A test to look inside the colon (sigmoidoscopy or colonoscopy).  How is this treated? This condition can usually be treated at home. However, various procedures may be done if dietary changes, lifestyle changes, and other home treatments do not help your symptoms. These procedures can help make the hemorrhoids smaller or remove them completely. Some of these procedures involve  surgery, and others do not. Common procedures include:  Rubber band ligation. Rubber bands are placed at the base of the hemorrhoids to cut off the blood supply to them.  Sclerotherapy. Medicine is injected into the hemorrhoids to shrink them.  Infrared coagulation. A type of light energy is used to get rid of the hemorrhoids.  Hemorrhoidectomy surgery. The hemorrhoids are surgically removed, and the veins that supply them are tied off.  Stapled hemorrhoidopexy surgery. A circular stapling device is used to remove the hemorrhoids and use staples to cut off the blood supply to them.  Follow these instructions at home: Eating and drinking  Eat foods that have a lot of fiber in them, such as whole grains, beans, nuts, fruits, and vegetables. Ask your health care provider about taking products that have added fiber (fiber supplements).  Drink enough fluid to keep your urine clear or pale yellow. Managing pain and swelling  Take warm sitz baths for 20 minutes, 3-4 times a day to ease pain and discomfort.  If directed, apply ice to the affected area. Using ice packs between sitz baths may be helpful. ? Put ice in a plastic bag. ? Place a towel between your skin and the bag. ? Leave the ice on for 20 minutes, 2-3 times a day. General instructions  Take over-the-counter and prescription medicines only as told by your health care provider.  Use medicated creams or suppositories as told.  Exercise regularly.  Go to the bathroom when you have the urge to have a bowel movement. Do not wait.    Avoid straining to have bowel movements.  Keep the anal area dry and clean. Use wet toilet paper or moist towelettes after a bowel movement.  Do not sit on the toilet for long periods of time. This increases blood pooling and pain. Contact a health care provider if:  You have increasing pain and swelling that are not controlled by treatment or medicine.  You have uncontrolled bleeding.  You  have difficulty having a bowel movement, or you are unable to have a bowel movement.  You have pain or inflammation outside the area of the hemorrhoids. This information is not intended to replace advice given to you by your health care provider. Make sure you discuss any questions you have with your health care provider. Document Released: 01/14/2000 Document Revised: 06/16/2015 Document Reviewed: 09/30/2014 Elsevier Interactive Patient Education  2018 [employer] Inc.  

## 2017-08-23 ENCOUNTER — Encounter: Payer: Self-pay | Admitting: Family Medicine

## 2017-08-23 ENCOUNTER — Ambulatory Visit: Payer: BC Managed Care – PPO | Admitting: Family Medicine

## 2017-08-23 ENCOUNTER — Encounter: Payer: Self-pay | Admitting: Internal Medicine

## 2017-08-23 ENCOUNTER — Ambulatory Visit: Payer: Self-pay | Admitting: Internal Medicine

## 2017-08-23 VITALS — BP 122/64 | HR 88 | Temp 98.2°F | Ht 68.0 in | Wt 201.5 lb

## 2017-08-23 DIAGNOSIS — N3000 Acute cystitis without hematuria: Secondary | ICD-10-CM

## 2017-08-23 DIAGNOSIS — R300 Dysuria: Secondary | ICD-10-CM

## 2017-08-23 DIAGNOSIS — N390 Urinary tract infection, site not specified: Secondary | ICD-10-CM | POA: Insufficient documentation

## 2017-08-23 LAB — POC URINALSYSI DIPSTICK (AUTOMATED)
BILIRUBIN UA: NEGATIVE
Blood, UA: 200
GLUCOSE UA: NEGATIVE
KETONES UA: NEGATIVE
Nitrite, UA: NEGATIVE
PH UA: 7.5 (ref 5.0–8.0)
Protein, UA: NEGATIVE
Spec Grav, UA: 1.01 (ref 1.010–1.025)
Urobilinogen, UA: 0.2 E.U./dL

## 2017-08-23 MED ORDER — FLUCONAZOLE 150 MG PO TABS: 150.0000 mg | ORAL_TABLET | Freq: Once | ORAL | 0 refills | Status: AC

## 2017-08-23 MED ORDER — SULFAMETHOXAZOLE-TRIMETHOPRIM 800-160 MG PO TABS: 1.0000 | ORAL_TABLET | Freq: Two times a day (BID) | ORAL | 0 refills | Status: DC

## 2017-08-23 NOTE — Patient Instructions (Addendum)
If you develop symptoms of a passing stone - one sided moderate to severe pain -please let us know   Drink lots of water  Start the bactrim immediately   We will culture your urine and alert you when that returns    Urinary Tract Infection, Adult A urinary tract infection (UTI) is an infection of any part of the urinary tract, which includes the kidneys, ureters, bladder, and urethra. These organs make, store, and get rid of urine in the body. UTI can be a bladder infection (cystitis) or kidney infection (pyelonephritis). What are the causes? This infection may be caused by fungi, viruses, or bacteria. Bacteria are the most common cause of UTIs. This condition can also be caused by repeated incomplete emptying of the bladder during urination. What increases the risk? This condition is more likely to develop if:  You ignore your need to urinate or hold urine for long periods of time.  You do not empty your bladder completely during urination.  You wipe back to front after urinating or having a bowel movement, if you are female.  You are uncircumcised, if you are female.  You are constipated.  You have a urinary catheter that stays in place (indwelling).  You have a weak defense (immune) system.  You have a medical condition that affects your bowels, kidneys, or bladder.  You have diabetes.  You take antibiotic medicines frequently or for long periods of time, and the antibiotics no longer work well against certain types of infections (antibiotic resistance).  You take medicines that irritate your urinary tract.  You are exposed to chemicals that irritate your urinary tract.  You are female.  What are the signs or symptoms? Symptoms of this condition include:  Fever.  Frequent urination or passing small amounts of urine frequently.  Needing to urinate urgently.  Pain or burning with urination.  Urine that smells bad or unusual.  Cloudy urine.  Pain in the lower  abdomen or back.  Trouble urinating.  Blood in the urine.  Vomiting or being less hungry than normal.  Diarrhea or abdominal pain.  Vaginal discharge, if you are female.  How is this diagnosed? This condition is diagnosed with a medical history and physical exam. You will also need to provide a urine sample to test your urine. Other tests may be done, including:  Blood tests.  Sexually transmitted disease (STD) testing.  If you have had more than one UTI, a cystoscopy or imaging studies may be done to determine the cause of the infections. How is this treated? Treatment for this condition often includes a combination of two or more of the following:  Antibiotic medicine.  Other medicines to treat less common causes of UTI.  Over-the-counter medicines to treat pain.  Drinking enough water to stay hydrated.  Follow these instructions at home:  Take over-the-counter and prescription medicines only as told by your health care provider.  If you were prescribed an antibiotic, take it as told by your health care provider. Do not stop taking the antibiotic even if you start to feel better.  Avoid alcohol, caffeine, tea, and carbonated beverages. They can irritate your bladder.  Drink enough fluid to keep your urine clear or pale yellow.  Keep all follow-up visits as told by your health care provider. This is important.  Make sure to: ? Empty your bladder often and completely. Do not hold urine for long periods of time. ? Empty your bladder before and after sex. ? Wipe from  front to back after a bowel movement if you are female. Use each tissue one time when you wipe. Contact a health care provider if:  You have back pain.  You have a fever.  You feel nauseous or vomit.  Your symptoms do not get better after 3 days.  Your symptoms go away and then return. Get help right away if:  You have severe back pain or lower abdominal pain.  You are vomiting and cannot keep  down any medicines or water. This information is not intended to replace advice given to you by your health care provider. Make sure you discuss any questions you have with your health care provider. Document Released: 10/26/2004 Document Revised: 06/30/2015 Document Reviewed: 12/07/2014 Elsevier Interactive Patient Education  [employer].

## 2017-08-23 NOTE — Progress Notes (Signed)
Subjective:    Patient ID: Brittany Davila, female    DOB: April 29, 1975, 42 y.o.   MRN: [id number]  HPI 42 yo pt of NP Baity here for urinary symptoms   Started yesterday  She gets these often  Also hx of kidney stones - has 7 mm stone in L kidney (has not passed one)  Back hurts   Drinking fluids   Burning to urinate  Frequency  Urgency  No bladder pain   Back pain -both sides    UA is positive  Results for orders placed or performed in visit on 08/23/17  POCT Urinalysis Dipstick (Automated)  Result Value Ref Range   Color, UA Yellow    Clarity, UA Cloudy    Glucose, UA Negative Negative   Bilirubin, UA Negative    Ketones, UA Negative    Spec Grav, UA 1.010 1.010 - 1.025   Blood, UA 200 Ery/uL    pH, UA 7.5 5.0 - 8.0   Protein, UA Negative Negative   Urobilinogen, UA 0.2 0.2 or 1.0 E.U./dL   Nitrite, UA Negative    Leukocytes, UA Large (3+) (A) Negative    Pt notes she gets vaginal yeast infx with most abx Wants a px for diflucan along with abx   Patient Active Problem List   Diagnosis Date Noted  . UTI (urinary tract infection) 08/23/2017  . Anxiety and depression 05/15/2016  . Bipolar 1 disorder ([place]) 05/15/2016  . Insomnia 05/15/2016   Past Medical History:  Diagnosis Date  . Anxiety   . Basal cell carcinoma (BCC) of face   . Bell's palsy    H/O  . Bipolar 1 disorder ([place])   . Depression   . Headache    MIGRAINES  . History of genital warts    Past Surgical History:  Procedure Laterality Date  . BREAST CYST ASPIRATION Left 2014   by Dr. Tamala Julian  . LAPAROSCOPIC TUBAL LIGATION Bilateral 11/10/2016   Procedure: LAPAROSCOPIC TUBAL LIGATION;  Surgeon: Benjaman Kindler, MD;  Location: [facility] OR[facility];  Service: Gynecology;  Laterality: Bilateral;  . MOHS SURGERY    . TONSILLECTOMY AND ADENOIDECTOMY     Social History   Tobacco Use  . Smoking status: Never Smoker  . Smokeless tobacco: Never Used  Substance Use Topics  . Alcohol use: Yes    Alcohol/week:  0.0 oz    Comment: occasional  . Drug use: No   Family History  Problem Relation Age of Onset  . Diabetes Maternal Grandfather   . Breast cancer Paternal Grandmother   . Colon cancer Paternal Grandfather   . Pancreatic cancer Paternal Grandfather   . Lung cancer Maternal Aunt    Allergies  Allergen Reactions  . Pollen Extract Other (See Comments)   Current Outpatient Medications on File Prior to Visit  Medication Sig Dispense Refill  . ALPRAZolam (XANAX) 1 MG tablet Take 1 mg by mouth 2 (two) times daily as needed for anxiety.    . cyclobenzaprine (FLEXERIL) 5 MG tablet Take 1 tablet (5 mg total) by mouth at bedtime. (Patient taking differently: Take 5 mg by mouth daily as needed. ) 20 tablet 0  . lamoTRIgine (LAMICTAL) 100 MG tablet Take 100 mg by mouth at bedtime.  5  . zolpidem (AMBIEN CR) 12.5 MG CR tablet Take 12.5 mg by mouth at bedtime.  2   No current facility-administered medications on file prior to visit.     Review of Systems  Constitutional: Positive for fatigue. Negative for  activity change, appetite change and fever.  HENT: Negative for congestion and sore throat.   Eyes: Negative for itching and visual disturbance.  Respiratory: Negative for cough and shortness of breath.   Cardiovascular: Negative for leg swelling.  Gastrointestinal: Negative for abdominal distention, abdominal pain, constipation, diarrhea and nausea.  Endocrine: Negative for cold intolerance and polydipsia.  Genitourinary: Positive for dysuria, frequency and urgency. Negative for difficulty urinating, flank pain and hematuria.  Musculoskeletal: Positive for back pain. Negative for myalgias.  Skin: Negative for rash.  Allergic/Immunologic: Negative for immunocompromised state.  Neurological: Negative for dizziness and weakness.  Hematological: Negative for adenopathy.       Objective:   Physical Exam  Constitutional: She appears well-developed and well-nourished. No distress.  Well  appearing   HENT:  Head: Normocephalic and atraumatic.  Eyes: Pupils are equal, round, and reactive to light. Conjunctivae and EOM are normal.  Neck: Normal range of motion. Neck supple.  Cardiovascular: Normal rate, regular rhythm and normal heart sounds.  Pulmonary/Chest: Effort normal and breath sounds normal.  Abdominal: Soft. Bowel sounds are normal. She exhibits no distension. There is tenderness. There is no rebound.  Mild L cva tenderness Mild bilateral low back tenderness in musculature  No suprapubic tenderness but pt notes a full feelin  Musculoskeletal: She exhibits no edema.  Lymphadenopathy:    She has no cervical adenopathy.  Neurological: She is alert.  Skin: Skin is warm and dry. No rash noted.  Psychiatric: She has a normal mood and affect.  Pleasant           Assessment & Plan:   Problem List Items Addressed This Visit      Genitourinary   UTI (urinary tract infection) - Primary    With some low back pain - also mild L flank pain  Known renal stone in that kidney (doubt passing it now but need to watch closely)-given urine strainer to hold on to tx with bactrim ds  Fluids  Azo prn if needed  cx pending Update if not starting to improve in a week or if worsening   Esp if inc flank pain      Relevant Medications   sulfamethoxazole-trimethoprim (BACTRIM DS,SEPTRA DS) 800-160 MG tablet   fluconazole (DIFLUCAN) 150 MG tablet   Other Relevant Orders   Urine Culture    Other Visit Diagnoses    Dysuria       Relevant Orders   POCT Urinalysis Dipstick (Automated) (Completed)

## 2017-08-23 NOTE — Telephone Encounter (Signed)
I returned her call.   She is c/o foul smelling urine, burning and frequency with small amounts being voided when she does go.   This started last night.   The foul odor started yesterday. She was diagnosed with a kidney stone on the left when she saw Webb Silversmith recently which was seen on an x-ray taken for another reason.   (Bilateral leg pain).  I scheduled her with Dr. Glori Bickers for this morning at 8:45am.    Reason for Disposition . Side (flank) or lower back pain present  Answer Assessment - Initial Assessment Questions 1. SEVERITY: "How bad is the pain?"  (e.g., Scale 1-10; mild, moderate, or severe)   - MILD (1-3): complains slightly about urination hurting   - MODERATE (4-7): interferes with normal activities     - SEVERE (8-10): excruciating, unwilling or unable to urinate because of the pain      Foul smell, burning, frequency,  Only a dribble when I do go.   It started late last night about 11:00PM. 2. FREQUENCY: "How many times have you had painful urination today?"      8 times.   The foul smell started yesterday morning. 3. PATTERN: "Is pain present every time you urinate or just sometimes?"      Burning started late last night. 4. ONSET: "When did the painful urination start?"      Last night 5. FEVER: "Do you have a fever?" If so, ask: "What is your temperature, how was it measured, and when did it start?"     No.   I just don't feel good. 6. PAST UTI: "Have you had a urine infection before?" If so, ask: "When was the last time?" and "What happened that time?"      Yes.    I have a kidney stone.   I came in last Thursday and saw Rollene Fare and found I had a 97mm kidney stone in left kidney. 7. CAUSE: "What do you think is causing the painful urination?"  (e.g., UTI, scratch, Herpes sore)     Maybe the kidney stone.   I've had UTIs before.   I'm having flank pain in the  Middle of my back radiating to both sides.   Worse when I sit down. 8. OTHER SYMPTOMS: "Do you have any other  symptoms?" (e.g., flank pain, vaginal discharge, genital sores, urgency, blood in urine)     No blood in urine.   Just foul odor of urine and frequency. 9. PREGNANCY: "Is there any chance you are pregnant?" "When was your last menstrual period?"     No.  Tubes tied.  Protocols used: [place]

## 2017-08-23 NOTE — Telephone Encounter (Signed)
Pt has appt with Dr Glori Bickers 08/23/17 at 8:45.

## 2017-08-23 NOTE — Assessment & Plan Note (Signed)
With some low back pain - also mild L flank pain  Known renal stone in that kidney (doubt passing it now but need to watch closely)-given urine strainer to hold on to tx with bactrim ds  Fluids  Azo prn if needed  cx pending Update if not starting to improve in a week or if worsening   Esp if inc flank pain

## 2017-08-24 LAB — URINE CULTURE
MICRO NUMBER:: 90881620
SPECIMEN QUALITY:: ADEQUATE

## 2017-10-21 ENCOUNTER — Encounter: Payer: Self-pay | Admitting: Internal Medicine

## 2017-10-24 ENCOUNTER — Ambulatory Visit (INDEPENDENT_AMBULATORY_CARE_PROVIDER_SITE_OTHER): Payer: BC Managed Care – PPO | Admitting: Orthopedic Surgery

## 2017-11-15 ENCOUNTER — Ambulatory Visit (INDEPENDENT_AMBULATORY_CARE_PROVIDER_SITE_OTHER): Payer: BC Managed Care – PPO | Admitting: Orthopedic Surgery

## 2017-11-19 ENCOUNTER — Ambulatory Visit: Payer: BC Managed Care – PPO | Admitting: Internal Medicine

## 2017-11-19 ENCOUNTER — Encounter: Payer: Self-pay | Admitting: Internal Medicine

## 2017-11-19 VITALS — BP 120/76 | HR 93 | Wt 197.0 lb

## 2017-11-19 DIAGNOSIS — M5442 Lumbago with sciatica, left side: Secondary | ICD-10-CM

## 2017-11-19 DIAGNOSIS — G8929 Other chronic pain: Secondary | ICD-10-CM

## 2017-11-19 NOTE — Patient Instructions (Signed)
Sciatica Sciatica is pain, numbness, weakness, or tingling along your sciatic nerve. The sciatic nerve starts in the lower back and goes down the back of each leg. Sciatica happens when this nerve is pinched or has pressure put on it. Sciatica usually goes away on its own or with treatment. Sometimes, sciatica may keep coming back (recur). Follow these instructions at home: Medicines  Take over-the-counter and prescription medicines only as told by your doctor.  Do not drive or use heavy machinery while taking prescription pain medicine. Managing pain  If directed, put ice on the affected area. ? Put ice in a plastic bag. ? Place a towel between your skin and the bag. ? Leave the ice on for 20 minutes, 2-3 times a day.  After icing, apply heat to the affected area before you exercise or as often as told by your doctor. Use the heat source that your doctor tells you to use, such as a moist heat pack or a heating pad. ? Place a towel between your skin and the heat source. ? Leave the heat on for 20-30 minutes. ? Remove the heat if your skin turns bright red. This is especially important if you are unable to feel pain, heat, or cold. You may have a greater risk of getting burned. Activity  Return to your normal activities as told by your doctor. Ask your doctor what activities are safe for you. ? Avoid activities that make your sciatica worse.  Take short rests during the day. Rest in a lying or standing position. This is usually better than sitting to rest. ? When you rest for a long time, do some physical activity or stretching between periods of rest. ? Avoid sitting for a long time without moving. Get up and move around at least one time each hour.  Exercise and stretch regularly, as told by your doctor.  Do not lift anything that is heavier than 10 lb (4.5 kg) while you have symptoms of sciatica. ? Avoid lifting heavy things even when you do not have symptoms. ? Avoid lifting heavy  things over and over.  When you lift objects, always lift in a way that is safe for your body. To do this, you should: ? Bend your knees. ? Keep the object close to your body. ? Avoid twisting. General instructions  Use good posture. ? Avoid leaning forward when you are sitting. ? Avoid hunching over when you are standing.  Stay at a healthy weight.  Wear comfortable shoes that support your feet. Avoid wearing high heels.  Avoid sleeping on a mattress that is too soft or too hard. You might have less pain if you sleep on a mattress that is firm enough to support your back.  Keep all follow-up visits as told by your doctor. This is important. Contact a doctor if:  You have pain that: ? Wakes you up when you are sleeping. ? Gets worse when you lie down. ? Is worse than the pain you have had in the past. ? Lasts longer than 4 weeks.  You lose weight for without trying. Get help right away if:  You cannot control when you pee (urinate) or poop (have a bowel movement).  You have weakness in any of these areas and it gets worse. ? Lower back. ? Lower belly (pelvis). ? Butt (buttocks). ? Legs.  You have redness or swelling of your back.  You have a burning feeling when you pee. This information is not intended to replace   advice given to you by your health care provider. Make sure you discuss any questions you have with your health care provider. Document Released: 10/26/2007 Document Revised: 06/24/2015 Document Reviewed: 09/25/2014 Elsevier Interactive Patient Education  2018 [employer]  

## 2017-11-19 NOTE — Progress Notes (Signed)
Subjective:    Patient ID: Brittany Davila, female    DOB: 09-May-1975, 42 y.o.   MRN: [id number]  HPI  Pt presents to the clinic today to follow up left leg pain. This started about 4 months ago. She describes the pain as throbbing, crampy, sharp and shooting. The pain starts in her midline lower back, radiates through her buttock to the inside of her left thigh and down into her calf. She has numbness and tingling but no weakness. It is worse after sitting for long periods of time or laying down. She denies any injury to the area. She was seen 07/2017 for the same. Xray of the lumbar spine showed:  IMPRESSION: No bony abnormality. Left lower pole nephrolithiasis.  Labs were unremarkable. She was treated with antiinflammatories, muscle relaxers and stretching exercise. She denies any improvement in her pain. She has been taking Ibuprofen, Tylenol Arthritis and ice with minimal relief. She reports she is unable to afford PT.  Review of Systems      Past Medical History:  Diagnosis Date  . Anxiety   . Basal cell carcinoma (BCC) of face   . Bell's palsy    H/O  . Bipolar 1 disorder ([place])   . Depression   . Headache    MIGRAINES  . History of genital warts     Current Outpatient Medications  Medication Sig Dispense Refill  . ALPRAZolam (XANAX) 1 MG tablet Take 1 mg by mouth 2 (two) times daily as needed for anxiety.    . cyclobenzaprine (FLEXERIL) 5 MG tablet Take 1 tablet (5 mg total) by mouth at bedtime. (Patient taking differently: Take 5 mg by mouth daily as needed. ) 20 tablet 0  . lamoTRIgine (LAMICTAL) 100 MG tablet Take 100 mg by mouth at bedtime.  5  . sulfamethoxazole-trimethoprim (BACTRIM DS,SEPTRA DS) 800-160 MG tablet Take 1 tablet by mouth 2 (two) times daily. 14 tablet 0  . zolpidem (AMBIEN CR) 12.5 MG CR tablet Take 12.5 mg by mouth at bedtime.  2   No current facility-administered medications for this visit.     Allergies  Allergen Reactions  . Pollen Extract  Other (See Comments)    Family History  Problem Relation Age of Onset  . Diabetes Maternal Grandfather   . Breast cancer Paternal Grandmother   . Colon cancer Paternal Grandfather   . Pancreatic cancer Paternal Grandfather   . Lung cancer Maternal Aunt     Social History   Socioeconomic History  . Marital status: Married    Spouse name: Not on file  . Number of children: Not on file  . Years of education: Not on file  . Highest education level: Not on file  Occupational History  . Not on file  Social Needs  . Financial resource strain: Not on file  . Food insecurity:    Worry: Not on file    Inability: Not on file  . Transportation needs:    Medical: Not on file    Non-medical: Not on file  Tobacco Use  . Smoking status: Never Smoker  . Smokeless tobacco: Never Used  Substance and Sexual Activity  . Alcohol use: Yes    Alcohol/week: 0.0 standard drinks    Comment: occasional  . Drug use: No  . Sexual activity: Never  Lifestyle  . Physical activity:    Days per week: Not on file    Minutes per session: Not on file  . Stress: Not on file  Relationships  .  Social connections:    Talks on phone: Not on file    Gets together: Not on file    Attends religious service: Not on file    Active member of club or organization: Not on file    Attends meetings of clubs or organizations: Not on file    Relationship status: Not on file  . Intimate partner violence:    Fear of current or ex partner: Not on file    Emotionally abused: Not on file    Physically abused: Not on file    Forced sexual activity: Not on file  Other Topics Concern  . Not on file  Social History Narrative  . Not on file     Constitutional: Denies fever, malaise, fatigue, headache or abrupt weight changes.  Gastrointestinal: Denies abdominal pain, bloating, constipation, diarrhea or blood in the stool.  GU: Denies urgency, frequency, pain with urination, burning sensation, blood in urine, odor  or discharge. Musculoskeletal: Pt reports low back pain with left leg pain. Denies decrease in range of motion, difficulty with gait, or joint swelling.  Neurological: Pt reports numbness and tingling of LLE. Denies dizziness, difficulty with memory, difficulty with speech or problems with balance and coordination.    No other specific complaints in a complete review of systems (except as listed in HPI above).  Objective:   Physical Exam  BP 120/76   Pulse 93   Wt 197 lb (89.4 kg)   LMP 11/15/2017   SpO2 98%   BMI 29.95 kg/m  Wt Readings from Last 3 Encounters:  11/19/17 197 lb (89.4 kg)  08/23/17 201 lb 8 oz (91.4 kg)  08/16/17 203 lb (92.1 kg)    General: Appears her stated age, well developed, well nourished in NAD. Cardiovascular: Normal rate and rhythm.  Pulmonary/Chest: Normal effort and positive vesicular breath sounds. No respiratory distress. No wheezes, rales or ronchi noted.  Abdomen: Soft and nontender. Normal bowel sounds. No CVA tenderness noted. Musculoskeletal: Normal flexion, extension and rotation of the spine. No bony tenderness noted over the spine. Strength 5/5 BLE. Able to walk on tip toes and heels. No difficulty with gait. Neurological: Alert and oriented. Positive SLR on the left. Sensation intact to BLE.   BMET    Component Value Date/Time   NA 139 08/16/2017 1423   K 3.8 08/16/2017 1423   CL 103 08/16/2017 1423   CO2 30 08/16/2017 1423   GLUCOSE 99 08/16/2017 1423   BUN 11 08/16/2017 1423   CREATININE 0.97 08/16/2017 1423   CALCIUM 9.4 08/16/2017 1423   GFRNONAA >60 02/18/2017 1320   GFRAA >60 02/18/2017 1320    Lipid Panel     Component Value Date/Time   CHOL 165 03/08/2017 1005   TRIG 162.0 (H) 03/08/2017 1005   HDL 41.50 03/08/2017 1005   CHOLHDL 4 03/08/2017 1005   VLDL 32.4 03/08/2017 1005   LDLCALC 91 03/08/2017 1005    CBC    Component Value Date/Time   WBC 11.5 (H) 03/08/2017 1005   RBC 4.55 03/08/2017 1005   HGB 13.7  03/08/2017 1005   HCT 41.4 03/08/2017 1005   PLT 330.0 03/08/2017 1005   MCV 91.0 03/08/2017 1005   MCH 30.6 02/18/2017 1320   MCHC 33.0 03/08/2017 1005   RDW 13.2 03/08/2017 1005    Hgb A1C Lab Results  Component Value Date   HGBA1C 5.7 03/08/2017            Assessment & Plan:   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

## 2017-11-25 ENCOUNTER — Other Ambulatory Visit: Payer: Self-pay | Admitting: Internal Medicine

## 2017-11-28 NOTE — Telephone Encounter (Signed)
Last filled 08/16/17... Please advise

## 2017-12-06 ENCOUNTER — Ambulatory Visit
Admission: RE | Admit: 2017-12-06 | Discharge: 2017-12-06 | Disposition: A | Discharge status: Home / Self Care | Payer: BC Managed Care – PPO | Source: Ambulatory Visit | Attending: Internal Medicine | Admitting: Internal Medicine

## 2017-12-06 DIAGNOSIS — G8929 Other chronic pain: Secondary | ICD-10-CM

## 2017-12-06 DIAGNOSIS — M5442 Lumbago with sciatica, left side: Principal | ICD-10-CM

## 2017-12-07 ENCOUNTER — Telehealth: Payer: Self-pay

## 2017-12-07 NOTE — Telephone Encounter (Signed)
-----   Message from Jearld Fenton, NP sent at 12/06/2017  5:42 PM EST ----- Call patient:  MRI of the lumbar spine does not show any acute findings.  At this point, would recommend ibuprofen or Aleve as needed, and referral to physical therapy.  Let me know if she is agreeable.

## 2017-12-10 ENCOUNTER — Telehealth: Payer: Self-pay | Admitting: Internal Medicine

## 2017-12-10 NOTE — Telephone Encounter (Signed)
Patient returned Melanie's call about her MRI results.

## 2017-12-10 NOTE — Telephone Encounter (Signed)
Pt returned your call Best number [phone]

## 2017-12-11 ENCOUNTER — Other Ambulatory Visit: Payer: Self-pay | Admitting: Internal Medicine

## 2017-12-11 ENCOUNTER — Encounter: Payer: Self-pay | Admitting: Internal Medicine

## 2017-12-11 DIAGNOSIS — M5442 Lumbago with sciatica, left side: Principal | ICD-10-CM

## 2017-12-11 DIAGNOSIS — G8929 Other chronic pain: Secondary | ICD-10-CM

## 2017-12-17 ENCOUNTER — Other Ambulatory Visit: Payer: Self-pay | Admitting: Nurse Practitioner

## 2017-12-17 DIAGNOSIS — Z20828 Contact with and (suspected) exposure to other viral communicable diseases: Secondary | ICD-10-CM

## 2017-12-17 MED ORDER — OSELTAMIVIR PHOSPHATE 75 MG PO CAPS: 75.0000 mg | ORAL_CAPSULE | Freq: Every day | ORAL | 0 refills | Status: DC

## 2017-12-17 NOTE — Progress Notes (Signed)
Husband: Fredia Chittenden was seen in office today by me. He is positive for Influenza A. Mrs. Ciancio requested to take tamilflu prophylaxis treatment. Rx sent

## 2018-01-04 ENCOUNTER — Other Ambulatory Visit: Payer: Self-pay | Admitting: Internal Medicine

## 2018-01-04 NOTE — Telephone Encounter (Signed)
Last filled 11/28/2017... Please advise

## 2018-01-21 ENCOUNTER — Ambulatory Visit: Payer: BC Managed Care – PPO | Admitting: Internal Medicine

## 2018-01-21 ENCOUNTER — Encounter: Payer: Self-pay | Admitting: Internal Medicine

## 2018-01-21 VITALS — BP 120/76 | HR 80 | Temp 98.1°F | Wt 197.0 lb

## 2018-01-21 DIAGNOSIS — R3915 Urgency of urination: Secondary | ICD-10-CM

## 2018-01-21 LAB — POC URINALSYSI DIPSTICK (AUTOMATED)
Bilirubin, UA: NEGATIVE
Blood, UA: NEGATIVE
Glucose, UA: NEGATIVE
KETONES UA: NEGATIVE
Nitrite, UA: POSITIVE
PH UA: 6.5 (ref 5.0–8.0)
PROTEIN UA: NEGATIVE
SPEC GRAV UA: 1.02 (ref 1.010–1.025)
UROBILINOGEN UA: 1 U/dL

## 2018-01-21 NOTE — Progress Notes (Signed)
HPI  Pt presents to the clinic today with c/o urinary urgency. She report this started 3-4 days ago. She denies frequency, dysuria, blood in her urine. She denies vaginal issues. She denies fever, chills, nausea or low back pain. She has taken AZO with some relief.   Review of Systems  Past Medical History:  Diagnosis Date  . Anxiety   . Basal cell carcinoma (BCC) of face   . Bell's palsy    H/O  . Bipolar 1 disorder ([place])   . Depression   . Headache    MIGRAINES  . History of genital warts     Family History  Problem Relation Age of Onset  . Diabetes Maternal Grandfather   . Breast cancer Paternal Grandmother   . Colon cancer Paternal Grandfather   . Pancreatic cancer Paternal Grandfather   . Lung cancer Maternal Aunt     Social History   Socioeconomic History  . Marital status: Married    Spouse name: Not on file  . Number of children: Not on file  . Years of education: Not on file  . Highest education level: Not on file  Occupational History  . Not on file  Social Needs  . Financial resource strain: Not on file  . Food insecurity:    Worry: Not on file    Inability: Not on file  . Transportation needs:    Medical: Not on file    Non-medical: Not on file  Tobacco Use  . Smoking status: Never Smoker  . Smokeless tobacco: Never Used  Substance and Sexual Activity  . Alcohol use: Yes    Alcohol/week: 0.0 standard drinks    Comment: occasional  . Drug use: No  . Sexual activity: Never  Lifestyle  . Physical activity:    Days per week: Not on file    Minutes per session: Not on file  . Stress: Not on file  Relationships  . Social connections:    Talks on phone: Not on file    Gets together: Not on file    Attends religious service: Not on file    Active member of club or organization: Not on file    Attends meetings of clubs or organizations: Not on file    Relationship status: Not on file  . Intimate partner violence:    Fear of current or ex  partner: Not on file    Emotionally abused: Not on file    Physically abused: Not on file    Forced sexual activity: Not on file  Other Topics Concern  . Not on file  Social History Narrative  . Not on file    Allergies  Allergen Reactions  . Pollen Extract Other (See Comments)     Constitutional: Denies fever, malaise, fatigue, headache or abrupt weight changes.   GU: Pt reports urgency. Denies frequency, dysuria, burning sensation, blood in urine, odor or discharge. Skin: Denies redness, rashes, lesions or ulcercations.   No other specific complaints in a complete review of systems (except as listed in HPI above).    Objective:   Physical Exam  BP 120/76   Pulse 80   Temp 98.1 F (36.7 C) (Oral)   Wt 197 lb (89.4 kg)   LMP 01/02/2018   BMI 29.95 kg/m   Wt Readings from Last 3 Encounters:  11/19/17 197 lb (89.4 kg)  08/23/17 201 lb 8 oz (91.4 kg)  08/16/17 203 lb (92.1 kg)    General: Appears her stated age, well developed,  well nourished in NAD. Abdomen: Soft and nontender. Normal bowel sounds. No distention or masses noted. No CVA tenderness.        Assessment & Plan:   Urgency:  Urinalysis: AZO interferred Will send urine culture OK to take AZO OTC Drink plenty of fluids  RTC as needed or if symptoms persist. Webb Silversmith, NP

## 2018-01-21 NOTE — Patient Instructions (Signed)
Urinary Tract Infection, Adult A urinary tract infection (UTI) is an infection of any part of the urinary tract. The urinary tract includes:  The kidneys.  The ureters.  The bladder.  The urethra. These organs make, store, and get rid of pee (urine) in the body. What are the causes? This is caused by germs (bacteria) in your genital area. These germs grow and cause swelling (inflammation) of your urinary tract. What increases the risk? You are more likely to develop this condition if:  You have a small, thin tube (catheter) to drain pee.  You cannot control when you pee or poop (incontinence).  You are female, and: ? You use these methods to prevent pregnancy: ? A medicine that kills sperm (spermicide). ? A device that blocks sperm (diaphragm). ? You have low levels of a female hormone (estrogen). ? You are pregnant.  You have genes that add to your risk.  You are sexually active.  You take antibiotic medicines.  You have trouble peeing because of: ? A prostate that is bigger than normal, if you are female. ? A blockage in the part of your body that drains pee from the bladder (urethra). ? A kidney stone. ? A nerve condition that affects your bladder (neurogenic bladder). ? Not getting enough to drink. ? Not peeing often enough.  You have other conditions, such as: ? Diabetes. ? A weak disease-fighting system (immune system). ? Sickle cell disease. ? Gout. ? Injury of the spine. What are the signs or symptoms? Symptoms of this condition include:  Needing to pee right away (urgently).  Peeing often.  Peeing small amounts often.  Pain or burning when peeing.  Blood in the pee.  Pee that smells bad or not like normal.  Trouble peeing.  Pee that is cloudy.  Fluid coming from the vagina, if you are female.  Pain in the belly or lower back. Other symptoms include:  Throwing up (vomiting).  No urge to eat.  Feeling mixed up (confused).  Being tired  and grouchy (irritable).  A fever.  Watery poop (diarrhea). How is this treated? This condition may be treated with:  Antibiotic medicine.  Other medicines.  Drinking enough water. Follow these instructions at home:  Medicines  Take over-the-counter and prescription medicines only as told by your doctor.  If you were prescribed an antibiotic medicine, take it as told by your doctor. Do not stop taking it even if you start to feel better. General instructions  Make sure you: ? Pee until your bladder is empty. ? Do not hold pee for a long time. ? Empty your bladder after sex. ? Wipe from front to back after pooping if you are a female. Use each tissue one time when you wipe.  Drink enough fluid to keep your pee pale yellow.  Keep all follow-up visits as told by your doctor. This is important. Contact a doctor if:  You do not get better after 1-2 days.  Your symptoms go away and then come back. Get help right away if:  You have very bad back pain.  You have very bad pain in your lower belly.  You have a fever.  You are sick to your stomach (nauseous).  You are throwing up. Summary  A urinary tract infection (UTI) is an infection of any part of the urinary tract.  This condition is caused by germs in your genital area.  There are many risk factors for a UTI. These include having a small, thin   tube to drain pee and not being able to control when you pee or poop.  Treatment includes antibiotic medicines for germs.  Drink enough fluid to keep your pee pale yellow. This information is not intended to replace advice given to you by your health care provider. Make sure you discuss any questions you have with your health care provider. Document Released: 07/05/2007 Document Revised: 07/26/2017 Document Reviewed: 07/26/2017 Elsevier Interactive Patient Education  2019 [employer]  

## 2018-01-21 NOTE — Addendum Note (Signed)
Addended by: Lurlean Nanny on: 01/21/2018 04:57 PM   Modules accepted: Orders

## 2018-01-22 LAB — URINE CULTURE
MICRO NUMBER: 91533332
Result:: NO GROWTH
SPECIMEN QUALITY:: ADEQUATE

## 2018-01-25 ENCOUNTER — Encounter: Payer: Self-pay | Admitting: Internal Medicine

## 2018-02-05 ENCOUNTER — Other Ambulatory Visit: Payer: Self-pay | Admitting: Internal Medicine

## 2018-02-05 NOTE — Telephone Encounter (Signed)
Flexeril Last filled:  01/04/18, #20/0 Last OV:  01/21/18, acute Next OV:  none

## 2018-02-21 ENCOUNTER — Other Ambulatory Visit: Payer: Self-pay | Admitting: Internal Medicine

## 2018-02-22 NOTE — Telephone Encounter (Signed)
Can you call and ask her how often she takes this?

## 2018-02-22 NOTE — Telephone Encounter (Signed)
Last filled 02/05/18 #20... please advise

## 2018-02-27 NOTE — Telephone Encounter (Signed)
Pt states she has been taking more often than usual but the Rx is on automatic and she does not currently need the Rx

## 2018-03-19 ENCOUNTER — Encounter: Payer: Self-pay | Admitting: Internal Medicine

## 2018-03-19 ENCOUNTER — Ambulatory Visit: Payer: BC Managed Care – PPO | Admitting: Internal Medicine

## 2018-03-19 VITALS — BP 120/78 | HR 92 | Temp 98.7°F | Wt 195.0 lb

## 2018-03-19 DIAGNOSIS — M5442 Lumbago with sciatica, left side: Secondary | ICD-10-CM | POA: Diagnosis not present

## 2018-03-19 DIAGNOSIS — G8929 Other chronic pain: Secondary | ICD-10-CM | POA: Diagnosis not present

## 2018-03-19 MED ORDER — PREDNISONE 10 MG PO TABS: ORAL_TABLET | ORAL | 0 refills | Status: DC

## 2018-03-19 NOTE — Patient Instructions (Signed)
Sciatica    Sciatica is pain, numbness, weakness, or tingling along your sciatic nerve. The sciatic nerve starts in the lower back and goes down the back of each leg. Sciatica happens when this nerve is pinched or has pressure put on it. Sciatica usually goes away on its own or with treatment. Sometimes, sciatica may keep coming back (recur).  Follow these instructions at home:  Medicines  · Take over-the-counter and prescription medicines only as told by your doctor.  · Do not drive or use heavy machinery while taking prescription pain medicine.  Managing pain  · If directed, put ice on the affected area.  ? Put ice in a plastic bag.  ? Place a towel between your skin and the bag.  ? Leave the ice on for 20 minutes, 2-3 times a day.  · After icing, apply heat to the affected area before you exercise or as often as told by your doctor. Use the heat source that your doctor tells you to use, such as a moist heat pack or a heating pad.  ? Place a towel between your skin and the heat source.  ? Leave the heat on for 20-30 minutes.  ? Remove the heat if your skin turns bright red. This is especially important if you are unable to feel pain, heat, or cold. You may have a greater risk of getting burned.  Activity  · Return to your normal activities as told by your doctor. Ask your doctor what activities are safe for you.  ? Avoid activities that make your sciatica worse.  · Take short rests during the day. Rest in a lying or standing position. This is usually better than sitting to rest.  ? When you rest for a long time, do some physical activity or stretching between periods of rest.  ? Avoid sitting for a long time without moving. Get up and move around at least one time each hour.  · Exercise and stretch regularly, as told by your doctor.  · Do not lift anything that is heavier than 10 lb (4.5 kg) while you have symptoms of sciatica.  ? Avoid lifting heavy things even when you do not have symptoms.  ? Avoid lifting  heavy things over and over.  · When you lift objects, always lift in a way that is safe for your body. To do this, you should:  ? Bend your knees.  ? Keep the object close to your body.  ? Avoid twisting.  General instructions  · Use good posture.  ? Avoid leaning forward when you are sitting.  ? Avoid hunching over when you are standing.  · Stay at a healthy weight.  · Wear comfortable shoes that support your feet. Avoid wearing high heels.  · Avoid sleeping on a mattress that is too soft or too hard. You might have less pain if you sleep on a mattress that is firm enough to support your back.  · Keep all follow-up visits as told by your doctor. This is important.  Contact a doctor if:  · You have pain that:  ? Wakes you up when you are sleeping.  ? Gets worse when you lie down.  ? Is worse than the pain you have had in the past.  ? Lasts longer than 4 weeks.  · You lose weight for without trying.  Get help right away if:  · You cannot control when you pee (urinate) or poop (have a bowel movement).  · You   have weakness in any of these areas and it gets worse.  ? Lower back.  ? Lower belly (pelvis).  ? Butt (buttocks).  ? Legs.  · You have redness or swelling of your back.  · You have a burning feeling when you pee.  This information is not intended to replace advice given to you by your health care provider. Make sure you discuss any questions you have with your health care provider.  Document Released: 10/26/2007 Document Revised: 06/24/2015 Document Reviewed: 09/25/2014  Elsevier Interactive Patient Education © 2019 [employer]

## 2018-03-19 NOTE — Progress Notes (Signed)
Subjective:    Patient ID: Brittany Davila, female    DOB: 1975/11/15, 43 y.o.   MRN: [id number]  HPI  Pt presents to the clinic today with c/o low back and left leg pain. This has been going on for almost a year. She describes the pain as throbbing, crampy, sharp and shooting. The pain starts in her midline lower back, radiates through her buttock and down into her calf. She has numbness and tingling in the left leg but denies weakness. The pain is better with standing, worse with sitting. The only complete relief she gets is when she lays down in the bed. She denies any injury to the area. She denies issues with bowel or bladder. She is unable to takes NSAID's due to history of gastritis. She has tried Tylenol, Flexeril and Hemp cream with some relief. Xray from 07/2017 reviewed. She has seen a chiropractor in the past. She was referred for PT, but reports she could not afford to go.  Review of Systems      Past Medical History:  Diagnosis Date  . Anxiety   . Basal cell carcinoma (BCC) of face   . Bell's palsy    H/O  . Bipolar 1 disorder ([place])   . Depression   . Headache    MIGRAINES  . History of genital warts     Current Outpatient Medications  Medication Sig Dispense Refill  . ALPRAZolam (XANAX) 1 MG tablet Take 1 mg by mouth 2 (two) times daily as needed for anxiety.    . cyclobenzaprine (FLEXERIL) 5 MG tablet TAKE 1 TABLET BY MOUTH EVERYDAY AT BEDTIME 20 tablet 0  . lamoTRIgine (LAMICTAL) 100 MG tablet Take 100 mg by mouth at bedtime.  5  . zolpidem (AMBIEN CR) 12.5 MG CR tablet Take 12.5 mg by mouth at bedtime.  2  . predniSONE (DELTASONE) 10 MG tablet Take 3 tabs on days 1-3, take 2 tabs on days 4-6, take 1 tab on days 7-9 18 tablet 0   No current facility-administered medications for this visit.     Allergies  Allergen Reactions  . Pollen Extract Other (See Comments)    Family History  Problem Relation Age of Onset  . Diabetes Maternal Grandfather   . Breast cancer  Paternal Grandmother   . Colon cancer Paternal Grandfather   . Pancreatic cancer Paternal Grandfather   . Lung cancer Maternal Aunt     Social History   Socioeconomic History  . Marital status: Married    Spouse name: Not on file  . Number of children: Not on file  . Years of education: Not on file  . Highest education level: Not on file  Occupational History  . Not on file  Social Needs  . Financial resource strain: Not on file  . Food insecurity:    Worry: Not on file    Inability: Not on file  . Transportation needs:    Medical: Not on file    Non-medical: Not on file  Tobacco Use  . Smoking status: Never Smoker  . Smokeless tobacco: Never Used  Substance and Sexual Activity  . Alcohol use: Yes    Alcohol/week: 0.0 standard drinks    Comment: occasional  . Drug use: No  . Sexual activity: Never  Lifestyle  . Physical activity:    Days per week: Not on file    Minutes per session: Not on file  . Stress: Not on file  Relationships  . Social connections:  Talks on phone: Not on file    Gets together: Not on file    Attends religious service: Not on file    Active member of club or organization: Not on file    Attends meetings of clubs or organizations: Not on file    Relationship status: Not on file  . Intimate partner violence:    Fear of current or ex partner: Not on file    Emotionally abused: Not on file    Physically abused: Not on file    Forced sexual activity: Not on file  Other Topics Concern  . Not on file  Social History Narrative  . Not on file     Constitutional: Denies fever, malaise, fatigue, headache or abrupt weight changes.  Respiratory: Denies difficulty breathing, shortness of breath, cough or sputum production.   Cardiovascular: Denies chest pain, chest tightness, palpitations or swelling in the hands or feet.  Gastrointestinal: Denies abdominal pain, bloating, constipation, diarrhea or blood in the stool.  GU: Denies urgency,  frequency, pain with urination, burning sensation, blood in urine, odor or discharge. Musculoskeletal: Pt reports low back and left leg pain. Denies decrease in range of motion, difficulty with gait, or joint swelling.  Neurological: Pt reports numbness and tingling in left leg. Denies dizziness, difficulty with memory, difficulty with speech.    No other specific complaints in a complete review of systems (except as listed in HPI above).  Objective:   Physical Exam   BP 120/78   Pulse 92   Temp 98.7 F (37.1 C) (Oral)   Wt 195 lb (88.5 kg)   LMP 03/17/2018   SpO2 98%   BMI 29.65 kg/m  Wt Readings from Last 3 Encounters:  03/19/18 195 lb (88.5 kg)  01/21/18 197 lb (89.4 kg)  11/19/17 197 lb (89.4 kg)    General: Appears her stated age, well developed, well nourished in NAD. Cardiovascular: Normal rate and rhythm. S1,S2 noted.  No murmur, rubs or gallops noted.  Pulmonary/Chest: Normal effort and positive vesicular breath sounds. No respiratory distress. No wheezes, rales or ronchi noted.  Musculoskeletal: Normal flexion, extension and rotation of the spine. No bony tenderness noted over the spine. Strength 5/5 BLE. No difficulty with gait.  Neurological: Alert and oriented. Negative SLR on the left.   BMET    Component Value Date/Time   NA 139 08/16/2017 1423   K 3.8 08/16/2017 1423   CL 103 08/16/2017 1423   CO2 30 08/16/2017 1423   GLUCOSE 99 08/16/2017 1423   BUN 11 08/16/2017 1423   CREATININE 0.97 08/16/2017 1423   CALCIUM 9.4 08/16/2017 1423   GFRNONAA >60 02/18/2017 1320   GFRAA >60 02/18/2017 1320    Lipid Panel     Component Value Date/Time   CHOL 165 03/08/2017 1005   TRIG 162.0 (H) 03/08/2017 1005   HDL 41.50 03/08/2017 1005   CHOLHDL 4 03/08/2017 1005   VLDL 32.4 03/08/2017 1005   LDLCALC 91 03/08/2017 1005    CBC    Component Value Date/Time   WBC 11.5 (H) 03/08/2017 1005   RBC 4.55 03/08/2017 1005   HGB 13.7 03/08/2017 1005   HCT 41.4  03/08/2017 1005   PLT 330.0 03/08/2017 1005   MCV 91.0 03/08/2017 1005   MCH 30.6 02/18/2017 1320   MCHC 33.0 03/08/2017 1005   RDW 13.2 03/08/2017 1005    Hgb A1C Lab Results  Component Value Date   HGBA1C 5.7 03/08/2017  Assessment & Plan:   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

## 2018-03-21 ENCOUNTER — Encounter: Payer: Self-pay | Admitting: Internal Medicine

## 2018-03-22 ENCOUNTER — Ambulatory Visit: Payer: BC Managed Care – PPO | Admitting: Internal Medicine

## 2018-04-03 ENCOUNTER — Encounter (INDEPENDENT_AMBULATORY_CARE_PROVIDER_SITE_OTHER): Payer: Self-pay | Admitting: Orthopaedic Surgery

## 2018-04-03 ENCOUNTER — Other Ambulatory Visit (INDEPENDENT_AMBULATORY_CARE_PROVIDER_SITE_OTHER): Payer: Self-pay

## 2018-04-03 ENCOUNTER — Ambulatory Visit (INDEPENDENT_AMBULATORY_CARE_PROVIDER_SITE_OTHER): Payer: BC Managed Care – PPO | Admitting: Orthopaedic Surgery

## 2018-04-03 ENCOUNTER — Ambulatory Visit (INDEPENDENT_AMBULATORY_CARE_PROVIDER_SITE_OTHER): Payer: BC Managed Care – PPO

## 2018-04-03 DIAGNOSIS — G8929 Other chronic pain: Secondary | ICD-10-CM

## 2018-04-03 DIAGNOSIS — M25562 Pain in left knee: Principal | ICD-10-CM

## 2018-04-03 DIAGNOSIS — M7062 Trochanteric bursitis, left hip: Secondary | ICD-10-CM

## 2018-04-03 NOTE — Progress Notes (Signed)
Office Visit Note   Patient: Brittany Davila           Date of Birth: 13-Dec-1975           MRN: [id number] Visit Date: 04/03/2018              Requested by: Jearld Fenton, NP [address] [place], [place] [address] PCP: Jearld Fenton, NP   Assessment & Plan: Visit Diagnoses:  1. Chronic pain of left knee   2. Trochanteric bursitis, left hip     Plan: I am concerned about her left knee in terms of the severe crepitation as well as a ligamentous laxity.  An MRI is warranted so we can better assess the knee itself.  I showed her stretching exercises with the trochanteric area and at her next visit after we go over an MRI of her left knee we can consider an intra-articular injection of the left knee versus another intervention depending on the MRI findings and certainly an injection of the trochanteric area.  She will continue her physical therapy and interim.  All question concerns were answered and addressed.  Follow-Up Instructions: Return in about 2 weeks (around 04/17/2018).   Orders:  Orders Placed This Encounter  Procedures  . XR Knee 1-2 Views Left   No orders of the defined types were placed in this encounter.     Procedures: No procedures performed   Clinical Data: No additional findings.   Subjective: Chief Complaint  Patient presents with  . Left Knee - Pain  Patient is a very pleasant 43 year old female who comes in with a 5-year history of worsening left knee pain as well as left hip pain.  She has had MRI of her back for sciatica in November of this past year which was read as normal.  She just got a physical therapy today working on her hip and knee pain.  This was set up by her primary care physician.  She is never had any type of injection in her hip or her knee.  She is not aware of any specific injuries.  Some of this she says wear-and-tear with time.  It does hurt her hip when she rolls on it at night she does get some groin pain.  Her left knee does  feel unstable to her knee locks and catches as well  HPI  Review of Systems She currently denies any headache, chest pain, shortness of breath, fever, chills, nausea, vomiting  Objective: Vital Signs: LMP 03/17/2018   Physical Exam She is alert and orient x3 and in no acute distress Ortho Exam Examination of her left knee does show just a slight effusion.  She has some laxity of her ligament in terms of the Lockman's exam is loose on her left knee compared the right knee which is more stable.  She has profound and severe patellofemoral crepitation on exam.  She has a positive Murray sign as well to the medial compartment.  She does have a lot of IT band pain and significant pain to palpation of the trochanteric area on the left side.  She does have smooth fluid internal and external rotation of the left hip. Specialty Comments:  No specialty comments available.  Imaging: Xr Knee 1-2 Views Left  Result Date: 04/03/2018 2 views of the left knee show no acute findings.  There is mild patellofemoral arthritic changes.  The alignment is overall neutral.    PMFS History: Patient Active Problem List  Diagnosis Date Noted  . Anxiety and depression 05/15/2016  . Bipolar 1 disorder ([place]) 05/15/2016  . Insomnia 05/15/2016   Past Medical History:  Diagnosis Date  . Anxiety   . Basal cell carcinoma (BCC) of face   . Bell's palsy    H/O  . Bipolar 1 disorder ([place])   . Depression   . Headache    MIGRAINES  . History of genital warts     Family History  Problem Relation Age of Onset  . Diabetes Maternal Grandfather   . Breast cancer Paternal Grandmother   . Colon cancer Paternal Grandfather   . Pancreatic cancer Paternal Grandfather   . Lung cancer Maternal Aunt     Past Surgical History:  Procedure Laterality Date  . BREAST CYST ASPIRATION Left 2014   by Dr. Tamala Julian  . LAPAROSCOPIC TUBAL LIGATION Bilateral 11/10/2016   Procedure: LAPAROSCOPIC TUBAL LIGATION;  Surgeon: Benjaman Kindler, MD;  Location: [facility] ORS;  Service: Gynecology;  Laterality: Bilateral;  . MOHS SURGERY    . TONSILLECTOMY AND ADENOIDECTOMY     Social History   Occupational History  . Not on file  Tobacco Use  . Smoking status: Never Smoker  . Smokeless tobacco: Never Used  Substance and Sexual Activity  . Alcohol use: Yes    Alcohol/week: 0.0 standard drinks    Comment: occasional  . Drug use: No  . Sexual activity: Never

## 2018-04-12 ENCOUNTER — Other Ambulatory Visit: Payer: Self-pay | Admitting: Internal Medicine

## 2018-04-13 ENCOUNTER — Other Ambulatory Visit: Payer: BC Managed Care – PPO

## 2018-04-16 NOTE — Telephone Encounter (Signed)
Last filled 02/05/2018.Marland KitchenMarland KitchenMarland Kitchen please advise

## 2018-04-23 ENCOUNTER — Other Ambulatory Visit: Payer: Self-pay | Admitting: Obstetrics and Gynecology

## 2018-04-23 DIAGNOSIS — D242 Benign neoplasm of left breast: Secondary | ICD-10-CM

## 2018-04-29 ENCOUNTER — Ambulatory Visit: Payer: BC Managed Care – PPO | Admitting: Allergy

## 2018-05-02 ENCOUNTER — Other Ambulatory Visit: Payer: Self-pay | Admitting: Internal Medicine

## 2018-05-03 NOTE — Telephone Encounter (Signed)
Last filed 04/16/18 #20... please advise

## 2018-05-06 ENCOUNTER — Ambulatory Visit (INDEPENDENT_AMBULATORY_CARE_PROVIDER_SITE_OTHER): Payer: BC Managed Care – PPO | Admitting: Orthopaedic Surgery

## 2018-05-06 ENCOUNTER — Ambulatory Visit: Payer: BC Managed Care – PPO | Admitting: Allergy

## 2018-05-20 ENCOUNTER — Ambulatory Visit (INDEPENDENT_AMBULATORY_CARE_PROVIDER_SITE_OTHER): Payer: BC Managed Care – PPO | Admitting: Internal Medicine

## 2018-05-20 ENCOUNTER — Encounter: Payer: Self-pay | Admitting: Internal Medicine

## 2018-05-20 VITALS — BP 112/86 | Wt 200.0 lb

## 2018-05-20 DIAGNOSIS — H6981 Other specified disorders of Eustachian tube, right ear: Secondary | ICD-10-CM | POA: Diagnosis not present

## 2018-05-20 DIAGNOSIS — R42 Dizziness and giddiness: Secondary | ICD-10-CM | POA: Diagnosis not present

## 2018-05-20 DIAGNOSIS — J0100 Acute maxillary sinusitis, unspecified: Secondary | ICD-10-CM

## 2018-05-20 NOTE — Progress Notes (Signed)
Virtual Visit via Video Note  I connected with Brittany Davila on 05/20/18 at 11:15 AM EDT by a video enabled telemedicine application and verified that I am speaking with the correct person using two identifiers.   I discussed the limitations of evaluation and management by telemedicine and the availability of in person appointments. The patient expressed understanding and agreed to proceed.  Patient Location:  Provider Location: Office  History of Present Illness:  Pt due for UC Follow Up. She went to the [facility] 4/13 with c/o  ear fullness, dizziness and sinus pressure. She was diagnosed with a URI, given a RX for Doxycycline and Bromfed cough syrup. She was also diagnosed with ETD and Dizziness, and given a RX for Meclizine. Since that time, she reports persistent ear fullness, sinus pressure and lightheadedness. She reports it is more a sense of imbalance than the room spinning. She denies ear drainage or loss of hearing. She denies nasal congestion, runny nose, sore throat, cough or shortness of breath. She has been taking Claritin with minimal relief.  Observations/Objective:  Alert and oriented x 3 NAD No obvious congestion, cough or SOB Fine motor finger coordination normal Well kempt Behavior, judgement and thought content are normal  Assessment and Plan:  UC Follow Up for URI, Dizziness, ETD- Right:  UC notes reviewed Continue Doxycycline and Bromfed Stop Meclizine Encouraged adequate fluid intake Continue Claritin Start Flonase BID x 3 days If no improvement by Thursday, will try Pred Taper  Follow Up Instructions:    I discussed the assessment and treatment plan with the patient. The patient was provided an opportunity to ask questions and all were answered. The patient agreed with the plan and demonstrated an understanding of the instructions.   The patient was advised to call back or seek an in-person evaluation if the symptoms worsen or if the condition fails to  improve as anticipated.    Webb Silversmith, NP

## 2018-05-20 NOTE — Patient Instructions (Signed)

## 2018-05-21 ENCOUNTER — Other Ambulatory Visit: Payer: Self-pay | Admitting: Family Medicine

## 2018-05-22 ENCOUNTER — Telehealth: Payer: Self-pay | Admitting: *Deleted

## 2018-05-22 ENCOUNTER — Other Ambulatory Visit: Payer: Self-pay | Admitting: Internal Medicine

## 2018-05-22 DIAGNOSIS — M5442 Lumbago with sciatica, left side: Secondary | ICD-10-CM

## 2018-05-22 MED ORDER — PREDNISONE 10 MG PO TABS: ORAL_TABLET | ORAL | 0 refills | Status: DC

## 2018-05-22 NOTE — Addendum Note (Signed)
Addended by: Jearld Fenton on: 05/22/2018 02:13 PM   Modules accepted: Orders

## 2018-05-22 NOTE — Telephone Encounter (Signed)
Patient called stating that she did a virtual visit with Webb Silversmith NP on Monday because of vertigo. Patient stated that the flonase is not working and wants to know if she can get a script for Prednisone that they talked about Monday?  [facility]

## 2018-05-22 NOTE — Telephone Encounter (Signed)
Prednisone sent to [facility]

## 2018-05-27 ENCOUNTER — Encounter: Payer: Self-pay | Admitting: Internal Medicine

## 2018-05-27 ENCOUNTER — Ambulatory Visit: Payer: BC Managed Care – PPO | Admitting: Internal Medicine

## 2018-05-27 ENCOUNTER — Other Ambulatory Visit: Payer: Self-pay

## 2018-05-27 VITALS — BP 122/78 | HR 80 | Temp 98.3°F | Wt 199.0 lb

## 2018-05-27 DIAGNOSIS — J0100 Acute maxillary sinusitis, unspecified: Secondary | ICD-10-CM

## 2018-05-27 DIAGNOSIS — H9201 Otalgia, right ear: Secondary | ICD-10-CM | POA: Diagnosis not present

## 2018-05-27 MED ORDER — PREDNISONE 10 MG PO TABS: ORAL_TABLET | ORAL | 0 refills | Status: DC

## 2018-05-27 NOTE — Patient Instructions (Signed)

## 2018-05-27 NOTE — Progress Notes (Signed)
Subjective:    Patient ID: Brittany Davila, female    DOB: [birth year], 43 y.o.   MRN: [id number]  HPI  Pt reports persistent right ear pain. She was seen at [facility] for the same 4/13. Diagnosed with a URI and ETD. She was started on Doxycycline, Bromfed and Meclizine. She was seen for follow up 4/20 via televisit with me. I had her start Flonase BID. She did not have any improvement so we put her on Prednisone. Since that time, she still reports facial pain and pressure, right ear pain. She denies runny nose, nasal congestion, sore throat or cough. She denise fever, chills or body aches. She does not smoke. She has no history of sinus surgery.   Review of Systems      Past Medical History:  Diagnosis Date  . Anxiety   . Basal cell carcinoma (BCC) of face   . Bell's palsy    H/O  . Bipolar 1 disorder ([place])   . Depression   . Headache    MIGRAINES  . History of genital warts     Current Outpatient Medications  Medication Sig Dispense Refill  . ALPRAZolam (XANAX) 1 MG tablet Take 1 mg by mouth 2 (two) times daily as needed for anxiety.    . brompheniramine-pseudoephedrine-DM 30-2-10 MG/5ML syrup Take by mouth 4 (four) times daily as needed.    . cyclobenzaprine (FLEXERIL) 5 MG tablet TAKE 1 TABLET BY MOUTH EVERYDAY AT BEDTIME 20 tablet 0  . doxycycline (VIBRA-TABS) 100 MG tablet Take 100 mg by mouth 2 (two) times daily.    Marland Kitchen lamoTRIgine (LAMICTAL) 100 MG tablet Take 100 mg by mouth at bedtime.  5  . predniSONE (DELTASONE) 10 MG tablet Take 3 tabs on days 1-2, take 2 tabs on days 3-4, take 1 tab on days 5-6 12 tablet 0  . zolpidem (AMBIEN CR) 12.5 MG CR tablet Take 12.5 mg by mouth at bedtime.  2   No current facility-administered medications for this visit.     Allergies  Allergen Reactions  . Pollen Extract Other (See Comments)    Family History  Problem Relation Age of Onset  . Diabetes Maternal Grandfather   . Breast cancer Paternal Grandmother   . Colon cancer Paternal  Grandfather   . Pancreatic cancer Paternal Grandfather   . Lung cancer Maternal Aunt     Social History   Socioeconomic History  . Marital status: Married    Spouse name: Not on file  . Number of children: Not on file  . Years of education: Not on file  . Highest education level: Not on file  Occupational History  . Not on file  Social Needs  . Financial resource strain: Not on file  . Food insecurity:    Worry: Not on file    Inability: Not on file  . Transportation needs:    Medical: Not on file    Non-medical: Not on file  Tobacco Use  . Smoking status: Never Smoker  . Smokeless tobacco: Never Used  Substance and Sexual Activity  . Alcohol use: Yes    Alcohol/week: 0.0 standard drinks    Comment: occasional  . Drug use: No  . Sexual activity: Never  Lifestyle  . Physical activity:    Days per week: Not on file    Minutes per session: Not on file  . Stress: Not on file  Relationships  . Social connections:    Talks on phone: Not on file  Gets together: Not on file    Attends religious service: Not on file    Active member of club or organization: Not on file    Attends meetings of clubs or organizations: Not on file    Relationship status: Not on file  . Intimate partner violence:    Fear of current or ex partner: Not on file    Emotionally abused: Not on file    Physically abused: Not on file    Forced sexual activity: Not on file  Other Topics Concern  . Not on file  Social History Narrative  . Not on file     Constitutional: Denies fever, malaise, fatigue, headache or abrupt weight changes.  HEENT: Pt reports right ear pain, facial pain and pressure. Denies eye pain, eye redness, ringing in the ears, wax buildup, runny nose, nasal congestion, bloody nose, or sore throat. Respiratory: Denies difficulty breathing, shortness of breath, cough or sputum production.   Cardiovascular: Denies chest pain, chest tightness, palpitations or swelling in the hands  or feet.  Neurological: Pt reports intermittent lightheadedness. Denies difficulty with memory, difficulty with speech or problems with balance and coordination.    No other specific complaints in a complete review of systems (except as listed in HPI above).  Objective:   Physical Exam   BP 122/78   Pulse 80   Temp 98.3 F (36.8 C) (Oral)   Wt 199 lb (90.3 kg)   SpO2 98%   BMI 30.26 kg/m  Wt Readings from Last 3 Encounters:  05/27/18 199 lb (90.3 kg)  05/20/18 200 lb (90.7 kg)  03/19/18 195 lb (88.5 kg)    General: Appears her stated age, well developed, well nourished in NAD. Skin: Warm, dry and intact.  HEENT: Head: normal shape and size, maxillary sinus pressure noted; Eyes: sclera white, no icterus, conjunctiva pink, PERRLA and EOMs intact; Ears: Tm's gray and intact, normal light reflex; Nose: mucosa pink and moist, septum midline; Throat/Mouth: Teeth present, mucosa pink and moist, no exudate, lesions or ulcerations noted.  Neck:  No adenopathy Cardiovascular: Normal rate and rhythm.  Pulmonary/Chest: Normal effort and positive vesicular breath sounds. No respiratory distress. No wheezes, rales or ronchi noted.  Neurological: Alert and oriented.    BMET    Component Value Date/Time   NA 139 08/16/2017 1423   K 3.8 08/16/2017 1423   CL 103 08/16/2017 1423   CO2 30 08/16/2017 1423   GLUCOSE 99 08/16/2017 1423   BUN 11 08/16/2017 1423   CREATININE 0.97 08/16/2017 1423   CALCIUM 9.4 08/16/2017 1423   GFRNONAA >60 02/18/2017 1320   GFRAA >60 02/18/2017 1320    Lipid Panel     Component Value Date/Time   CHOL 165 03/08/2017 1005   TRIG 162.0 (H) 03/08/2017 1005   HDL 41.50 03/08/2017 1005   CHOLHDL 4 03/08/2017 1005   VLDL 32.4 03/08/2017 1005   LDLCALC 91 03/08/2017 1005    CBC    Component Value Date/Time   WBC 11.5 (H) 03/08/2017 1005   RBC 4.55 03/08/2017 1005   HGB 13.7 03/08/2017 1005   HCT 41.4 03/08/2017 1005   PLT 330.0 03/08/2017 1005   MCV  91.0 03/08/2017 1005   MCH 30.6 02/18/2017 1320   MCHC 33.0 03/08/2017 1005   RDW 13.2 03/08/2017 1005    Hgb A1C Lab Results  Component Value Date   HGBA1C 5.7 03/08/2017          Assessment & Plan:   Sinusitis, ETD, Right:  Exam benign Finished course of antibiotics, Meclizine and Prednisone with minimal improvement Will repeat Pred Taper at higher dose x 6 days Referral to ENT placed for further evaluation No indication for additional abx at this time  Return/Followup precautions discussed Webb Silversmith, NP

## 2018-05-28 ENCOUNTER — Telehealth: Payer: Self-pay

## 2018-05-28 NOTE — Telephone Encounter (Signed)
I called and left a message for patient in regards to the recent cancellations of her NP visits. I did ask if she was interested in rescheduling.

## 2018-06-12 ENCOUNTER — Ambulatory Visit: Payer: BC Managed Care – PPO | Admitting: Internal Medicine

## 2018-06-21 ENCOUNTER — Other Ambulatory Visit: Payer: BC Managed Care – PPO

## 2018-06-26 ENCOUNTER — Ambulatory Visit
Admission: RE | Admit: 2018-06-26 | Discharge: 2018-06-26 | Disposition: A | Discharge status: Home / Self Care | Payer: BC Managed Care – PPO | Source: Ambulatory Visit | Attending: Obstetrics and Gynecology | Admitting: Obstetrics and Gynecology

## 2018-06-26 ENCOUNTER — Other Ambulatory Visit: Payer: Self-pay

## 2018-06-26 DIAGNOSIS — D242 Benign neoplasm of left breast: Secondary | ICD-10-CM | POA: Diagnosis not present

## 2018-07-03 ENCOUNTER — Other Ambulatory Visit: Payer: Self-pay | Admitting: Obstetrics and Gynecology

## 2018-07-03 ENCOUNTER — Other Ambulatory Visit: Payer: Self-pay

## 2018-07-03 ENCOUNTER — Ambulatory Visit
Admission: RE | Admit: 2018-07-03 | Discharge: 2018-07-03 | Disposition: A | Discharge status: Home / Self Care | Payer: BC Managed Care – PPO | Source: Ambulatory Visit | Attending: Obstetrics and Gynecology | Admitting: Obstetrics and Gynecology

## 2018-07-03 DIAGNOSIS — H6983 Other specified disorders of Eustachian tube, bilateral: Secondary | ICD-10-CM | POA: Insufficient documentation

## 2018-07-03 DIAGNOSIS — N6310 Unspecified lump in the right breast, unspecified quadrant: Secondary | ICD-10-CM

## 2018-07-03 DIAGNOSIS — R928 Other abnormal and inconclusive findings on diagnostic imaging of breast: Secondary | ICD-10-CM

## 2018-07-03 DIAGNOSIS — H6993 Unspecified Eustachian tube disorder, bilateral: Secondary | ICD-10-CM | POA: Insufficient documentation

## 2018-07-03 HISTORY — PX: BREAST BIOPSY: SHX20

## 2018-07-04 LAB — SURGICAL PATHOLOGY

## 2018-08-06 ENCOUNTER — Telehealth: Payer: Self-pay | Admitting: Internal Medicine

## 2018-08-06 DIAGNOSIS — G8929 Other chronic pain: Secondary | ICD-10-CM

## 2018-08-06 DIAGNOSIS — M542 Cervicalgia: Secondary | ICD-10-CM

## 2018-08-06 NOTE — Addendum Note (Signed)
Addended by: Jearld Fenton on: 08/06/2018 10:58 AM   Modules accepted: Orders

## 2018-08-06 NOTE — Telephone Encounter (Signed)
Patent stated she was seen several months ago for her back/neck pain and discussed being referred to a neurologist .   She stated that her ENT doctor is going to be referring her for another issue and that neuro stated they would go ahead and see her for all issues if our office will send a referral to them also   Patient would like this referral sent to [facility]   C/B # [phone]

## 2018-08-06 NOTE — Telephone Encounter (Signed)
Referral placed.

## 2018-08-19 ENCOUNTER — Other Ambulatory Visit: Payer: BC Managed Care – PPO

## 2018-08-19 ENCOUNTER — Encounter: Payer: Self-pay | Admitting: Internal Medicine

## 2018-08-19 ENCOUNTER — Ambulatory Visit (INDEPENDENT_AMBULATORY_CARE_PROVIDER_SITE_OTHER): Payer: BC Managed Care – PPO | Admitting: Internal Medicine

## 2018-08-19 ENCOUNTER — Telehealth: Payer: Self-pay

## 2018-08-19 DIAGNOSIS — R300 Dysuria: Secondary | ICD-10-CM | POA: Diagnosis not present

## 2018-08-19 DIAGNOSIS — R3915 Urgency of urination: Secondary | ICD-10-CM | POA: Diagnosis not present

## 2018-08-19 LAB — POC URINALSYSI DIPSTICK (AUTOMATED)
Bilirubin, UA: NEGATIVE
Blood, UA: NEGATIVE
Glucose, UA: NEGATIVE
Ketones, UA: NEGATIVE
Leukocytes, UA: NEGATIVE
Nitrite, UA: NEGATIVE
Protein, UA: NEGATIVE
Spec Grav, UA: 1.015 (ref 1.010–1.025)
Urobilinogen, UA: 0.2 E.U./dL
pH, UA: 5.5 (ref 5.0–8.0)

## 2018-08-19 NOTE — Telephone Encounter (Signed)
Pt has pain upon urination with urgency and odor of urine;lt lower back pain; pt has kidney stone. No abdominal pain, no covid symptoms, no travel and no known exposure to + covid. Pt has virtual appt today at 4pm and will bring urine to lab. ED precautions given.

## 2018-08-19 NOTE — Telephone Encounter (Signed)
Will discuss at upcoming appt.

## 2018-08-19 NOTE — Progress Notes (Signed)
Virtual Visit via Video Note  I connected with Brittany Davila on 08/19/18 at  4:00 PM EDT by a video enabled telemedicine application and verified that I am speaking with the correct person using two identifiers.  Location: Patient: Home Provider: Office   HPI  Pt presents to the clinic today with c/o urinary urgency, dysuria and left lower back pain. This started 3 days ago. She denies frequency, burning sensation or blood in her urine. She denies nausea, vomiting, fever or chills. She denies vaginal complaints. She has not tried anything OTC for this.   Review of Systems  Past Medical History:  Diagnosis Date  . Anxiety   . Basal cell carcinoma (BCC) of face   . Bell's palsy    H/O  . Bipolar 1 disorder ([place])   . Depression   . Headache    MIGRAINES  . History of genital warts     Family History  Problem Relation Age of Onset  . Diabetes Maternal Grandfather   . Breast cancer Paternal Grandmother   . Colon cancer Paternal Grandfather   . Pancreatic cancer Paternal Grandfather   . Lung cancer Maternal Aunt     Social History   Socioeconomic History  . Marital status: Married    Spouse name: Not on file  . Number of children: Not on file  . Years of education: Not on file  . Highest education level: Not on file  Occupational History  . Not on file  Social Needs  . Financial resource strain: Not on file  . Food insecurity    Worry: Not on file    Inability: Not on file  . Transportation needs    Medical: Not on file    Non-medical: Not on file  Tobacco Use  . Smoking status: Never Smoker  . Smokeless tobacco: Never Used  Substance and Sexual Activity  . Alcohol use: Yes    Alcohol/week: 0.0 standard drinks    Comment: occasional  . Drug use: No  . Sexual activity: Never  Lifestyle  . Physical activity    Days per week: Not on file    Minutes per session: Not on file  . Stress: Not on file  Relationships  . Social Herbalist on phone: Not on  file    Gets together: Not on file    Attends religious service: Not on file    Active member of club or organization: Not on file    Attends meetings of clubs or organizations: Not on file    Relationship status: Not on file  . Intimate partner violence    Fear of current or ex partner: Not on file    Emotionally abused: Not on file    Physically abused: Not on file    Forced sexual activity: Not on file  Other Topics Concern  . Not on file  Social History Narrative  . Not on file    Allergies  Allergen Reactions  . Pollen Extract Other (See Comments)     Constitutional: Denies fever, malaise, fatigue, headache or abrupt weight changes.   GU: Pt reports urgency, and pain with urination. Denies frequency,  burning sensation, blood in urine, odor or discharge. Skin: Denies redness, rashes, lesions or ulcercations.   No other specific complaints in a complete review of systems (except as listed in HPI above).    Objective:   Physical Exam   Wt Readings from Last 3 Encounters:  05/27/18 199 lb (90.3 kg)  05/20/18 200 lb (90.7 kg)  03/19/18 195 lb (88.5 kg)    General: Appears her stated age, well developed, well nourished in NAD. Pulmonary/Chest: Normal effort. No respiratory distress.  Abdomen: No obvious distention or masses noted.          Assessment & Plan:   Urgency, Dysuria:  Urinalysis: normal Will send urine culture Avoid caffeine in case this is cystitist OK to take AZO OTC Drink plenty of fluids  RTC as needed or if symptoms persist.  I discussed the limitations of evaluation and management by telemedicine and the availability of in person appointments. The patient expressed understanding and agreed to proceed.  Follow Up Instructions:    I discussed the assessment and treatment plan with the patient. The patient was provided an opportunity to ask questions and all were answered. The patient agreed with the plan and demonstrated an understanding of  the instructions.   The patient was advised to call back or seek an in-person evaluation if the symptoms worsen or if the condition fails to improve as anticipated.    Webb Silversmith, NP

## 2018-08-20 NOTE — Patient Instructions (Signed)

## 2018-08-22 LAB — URINE CULTURE
MICRO NUMBER:: 683704
SPECIMEN QUALITY:: ADEQUATE

## 2018-08-22 MED ORDER — NITROFURANTOIN MONOHYD MACRO 100 MG PO CAPS: 100.0000 mg | ORAL_CAPSULE | Freq: Two times a day (BID) | ORAL | 0 refills | Status: DC

## 2018-08-22 NOTE — Addendum Note (Signed)
Addended by: Jearld Fenton on: 08/22/2018 10:40 AM   Modules accepted: Orders

## 2018-08-27 ENCOUNTER — Ambulatory Visit: Payer: BC Managed Care – PPO | Admitting: Neurology

## 2018-09-05 ENCOUNTER — Other Ambulatory Visit: Payer: Self-pay

## 2018-09-05 ENCOUNTER — Ambulatory Visit: Payer: BC Managed Care – PPO | Admitting: Neurology

## 2018-09-05 ENCOUNTER — Encounter: Payer: Self-pay | Admitting: Neurology

## 2018-09-05 VITALS — BP 118/78 | HR 91 | Temp 98.0°F | Ht 69.0 in | Wt 194.8 lb

## 2018-09-05 DIAGNOSIS — R202 Paresthesia of skin: Secondary | ICD-10-CM | POA: Insufficient documentation

## 2018-09-05 DIAGNOSIS — G43909 Migraine, unspecified, not intractable, without status migrainosus: Secondary | ICD-10-CM | POA: Insufficient documentation

## 2018-09-05 DIAGNOSIS — R51 Headache: Secondary | ICD-10-CM | POA: Diagnosis not present

## 2018-09-05 DIAGNOSIS — R519 Headache, unspecified: Secondary | ICD-10-CM

## 2018-09-05 MED ORDER — PROPRANOLOL HCL 40 MG PO TABS: 40.0000 mg | ORAL_TABLET | Freq: Two times a day (BID) | ORAL | 11 refills | Status: DC

## 2018-09-05 MED ORDER — SUMATRIPTAN SUCCINATE 50 MG PO TABS: 50.0000 mg | ORAL_TABLET | ORAL | 11 refills | Status: DC | PRN

## 2018-09-05 NOTE — Progress Notes (Signed)
PATIENT: Brittany Davila DOB: 1975/10/05  Chief Complaint  Patient presents with  . New Patient (Initial Visit)    Referral fro atypical migraine left side nerve pain and both extremties  pt sees a psychologist for her mood swings bipolar disorder      HISTORICAL  Brittany Davila is a 43 year old female, seen in request by her primary care physician Dr. Lind Guest for evaluation of headaches initial evaluation was on September 05, 2018.  I have reviewed and summarized the referring note from the referring physician.  She had a past medical history of bipolar disorder, has been on stable dose of Lamictal 150 mg twice a day, she began to have migraine headaches since 2018, increased frequency over the past 3 months, she complains of dizziness, especially with sudden positional change, such as bending over, losing her balance easily, she also complains of intermittent right face icy cold sensation, numbness, sometimes she feels her body spinning back-and-forth with movement, she also has bilateral frontal retro-orbital area pressure headaches, she has migraine headaches about couple times each week, she describes migraine as right retro-orbital area severe pounding headache with sensitive to light and noise, 2 to 3 days, she has tried Excedrin Migraine and muscle relaxant with some helps, in addition she complains of intermittent left arm and leg pain, low back pain,   REVIEW OF SYSTEMS: Full 14 system review of systems performed and notable only for as above All other review of systems were negative.  ALLERGIES: Allergies  Allergen Reactions  . Pollen Extract Other (See Comments)    HOME MEDICATIONS: Current Outpatient Medications  Medication Sig Dispense Refill  . ALPRAZolam (XANAX) 1 MG tablet Take 1 mg by mouth 2 (two) times daily as needed for anxiety.    . lamoTRIgine (LAMICTAL) 100 MG tablet Take 150 mg by mouth daily.   5  . fexofenadine (ALLEGRA) 180 MG tablet Take 180 mg by mouth  daily.    Marland Kitchen zolpidem (AMBIEN CR) 12.5 MG CR tablet Take 12.5 mg by mouth at bedtime.  2   No current facility-administered medications for this visit.     PAST MEDICAL HISTORY: Past Medical History:  Diagnosis Date  . Anxiety   . Basal cell carcinoma (BCC) of face   . Bell's palsy    H/O  . Bipolar 1 disorder ([place])   . Depression   . Headache    MIGRAINES  . History of genital warts     PAST SURGICAL HISTORY: Past Surgical History:  Procedure Laterality Date  . BREAST BIOPSY Right 07/03/2018   [place] bx           heart marker              path pending  . BREAST CYST ASPIRATION Left 2014   by Dr. Tamala Julian  . LAPAROSCOPIC TUBAL LIGATION Bilateral 11/10/2016   Procedure: LAPAROSCOPIC TUBAL LIGATION;  Surgeon: Benjaman Kindler, MD;  Location: [facility] ORS;  Service: Gynecology;  Laterality: Bilateral;  . MOHS SURGERY    . TONSILLECTOMY AND ADENOIDECTOMY    . tubes in right ear      FAMILY HISTORY: Family History  Problem Relation Age of Onset  . Diabetes Maternal Grandfather   . Breast cancer Paternal Grandmother   . Colon cancer Paternal Grandfather   . Pancreatic cancer Paternal Grandfather   . Lung cancer Maternal Aunt     SOCIAL HISTORY: Social History   Socioeconomic History  . Marital status: Married  Spouse name: Not on file  . Number of children: Not on file  . Years of education: Not on file  . Highest education level: Not on file  Occupational History  . Not on file  Social Needs  . Financial resource strain: Not on file  . Food insecurity    Worry: Not on file    Inability: Not on file  . Transportation needs    Medical: Not on file    Non-medical: Not on file  Tobacco Use  . Smoking status: Never Smoker  . Smokeless tobacco: Never Used  Substance and Sexual Activity  . Alcohol use: Yes    Alcohol/week: 0.0 standard drinks    Comment: occasional  . Drug use: No  . Sexual activity: Never  Lifestyle  . Physical activity    Days per week: Not on  file    Minutes per session: Not on file  . Stress: Not on file  Relationships  . Social Herbalist on phone: Not on file    Gets together: Not on file    Attends religious service: Not on file    Active member of club or organization: Not on file    Attends meetings of clubs or organizations: Not on file    Relationship status: Not on file  . Intimate partner violence    Fear of current or ex partner: Not on file    Emotionally abused: Not on file    Physically abused: Not on file    Forced sexual activity: Not on file  Other Topics Concern  . Not on file  Social History Narrative  . Not on file     PHYSICAL EXAM   Vitals:   09/05/18 0828  BP: 118/78  Pulse: 91  Temp: 98 F (36.7 C)  Weight: 194 lb 12.8 oz (88.4 kg)  Height: 5\' 9"  (1.753 m)    Not recorded      Body mass index is 28.77 kg/m.  PHYSICAL EXAMNIATION:  Gen: NAD, conversant, well nourised, obese, well groomed                     Cardiovascular: Regular rate rhythm, no peripheral edema, warm, nontender. Eyes: Conjunctivae clear without exudates or hemorrhage Neck: Supple, no carotid bruits. Pulmonary: Clear to auscultation bilaterally   NEUROLOGICAL EXAM:  MENTAL STATUS: Speech:    Speech is normal; fluent and spontaneous with normal comprehension.  Cognition:     Orientation to time, place and person     Normal recent and remote memory     Normal Attention span and concentration     Normal Language, naming, repeating,spontaneous speech     Fund of knowledge   CRANIAL NERVES: CN II: Visual fields are full to confrontation. Fundoscopic exam is normal with sharp discs and no vascular changes. Pupils are round equal and briskly reactive to light. CN III, IV, VI: extraocular movement are normal. No ptosis. CN V: Facial sensation is intact to pinprick in all 3 divisions bilaterally. Corneal responses are intact.  CN VII: Face is symmetric with normal eye closure and smile. CN VIII:  Hearing is normal to rubbing fingers CN IX, X: Palate elevates symmetrically. Phonation is normal. CN XI: Head turning and shoulder shrug are intact CN XII: Tongue is midline with normal movements and no atrophy.  MOTOR: There is no pronator drift of out-stretched arms. Muscle bulk and tone are normal. Muscle strength is normal.  REFLEXES: Reflexes are 2+ and  symmetric at the biceps, triceps, knees, and ankles. Plantar responses are flexor.  SENSORY: Intact to light touch, pinprick, positional sensation and vibratory sensation are intact in fingers and toes.  COORDINATION: Rapid alternating movements and fine finger movements are intact. There is no dysmetria on finger-to-nose and heel-knee-shin.    GAIT/STANCE: Posture is normal. Gait is steady with normal steps, base, arm swing, and turning. Heel and toe walking are normal. Tandem gait is normal.  Romberg is absent.   DIAGNOSTIC DATA (LABS, IMAGING, TESTING) - I reviewed patient records, labs, notes, testing and imaging myself where available.   ASSESSMENT AND PLAN  Danetra KENDRAH LOVERN is a 43 y.o. female   Chronic migraine New onset left side paresthesia  MRI of brain to rule out structural lesion  She is already taking lamotrigine 150 mg for her bipolar disorder  Add on propranolol 40 mg twice a day  Imitrex 50mg  prn    Marcial Pacas, M.D. Ph.D.  [facility] [address], [place], [place] [address] Ph: [phone] Fax: [phone]  CC: Helayne Seminole, MD

## 2018-09-09 ENCOUNTER — Telehealth: Payer: Self-pay | Admitting: Neurology

## 2018-09-09 NOTE — Telephone Encounter (Signed)
Patient called me back she is scheduled at [facility] for 10/02/18. No to the covid questions.

## 2018-09-09 NOTE — Telephone Encounter (Signed)
LVM for pt to call back about scheduling mri  [employer] Auth: [id number] (exp. 09/09/18 to 03/07/19)

## 2018-10-02 ENCOUNTER — Other Ambulatory Visit: Payer: Self-pay

## 2018-10-02 ENCOUNTER — Ambulatory Visit: Payer: BC Managed Care – PPO

## 2018-10-02 DIAGNOSIS — R202 Paresthesia of skin: Secondary | ICD-10-CM

## 2018-10-02 DIAGNOSIS — R51 Headache: Secondary | ICD-10-CM

## 2018-10-02 DIAGNOSIS — R519 Headache, unspecified: Secondary | ICD-10-CM

## 2018-10-28 IMAGING — MG MM DIGITAL SCREENING BILAT W/ TOMO W/ CAD
8 series · 8 of 24 positions shown · non-contrast
Comparison: Previous exam(s).

CLINICAL DATA: Screening.

EXAM:
DIGITAL SCREENING BILATERAL MAMMOGRAM WITH TOMO AND CAD

[L CC synth-2D]
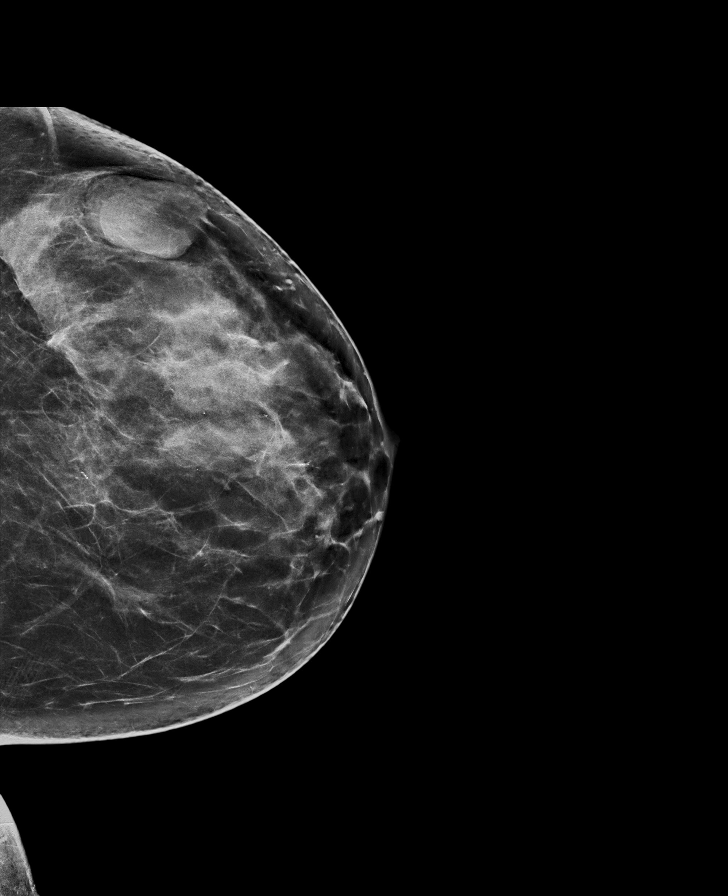

[L MLO synth-2D]
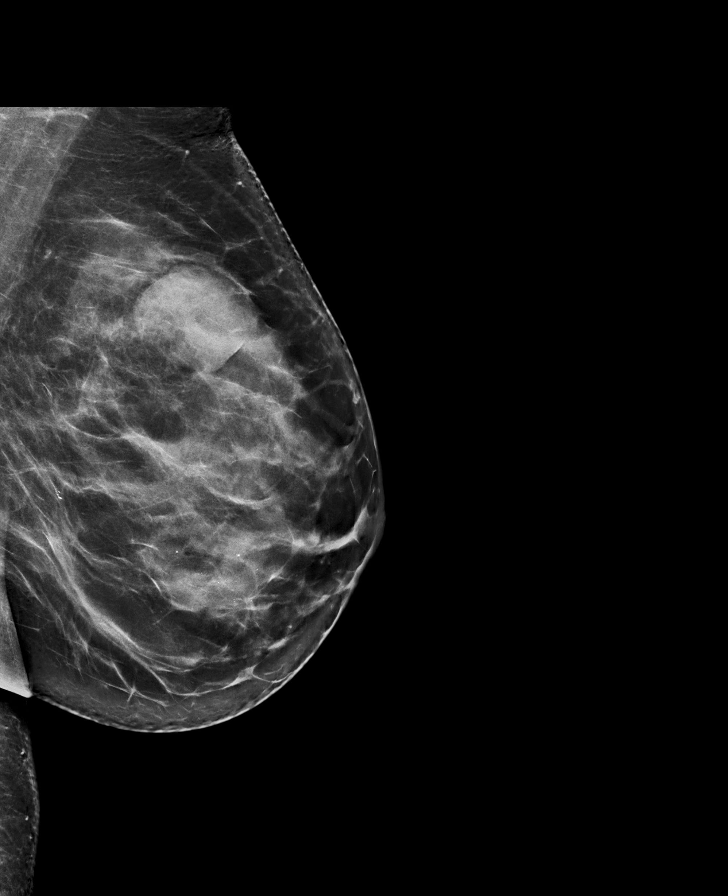

[R CC synth-2D]
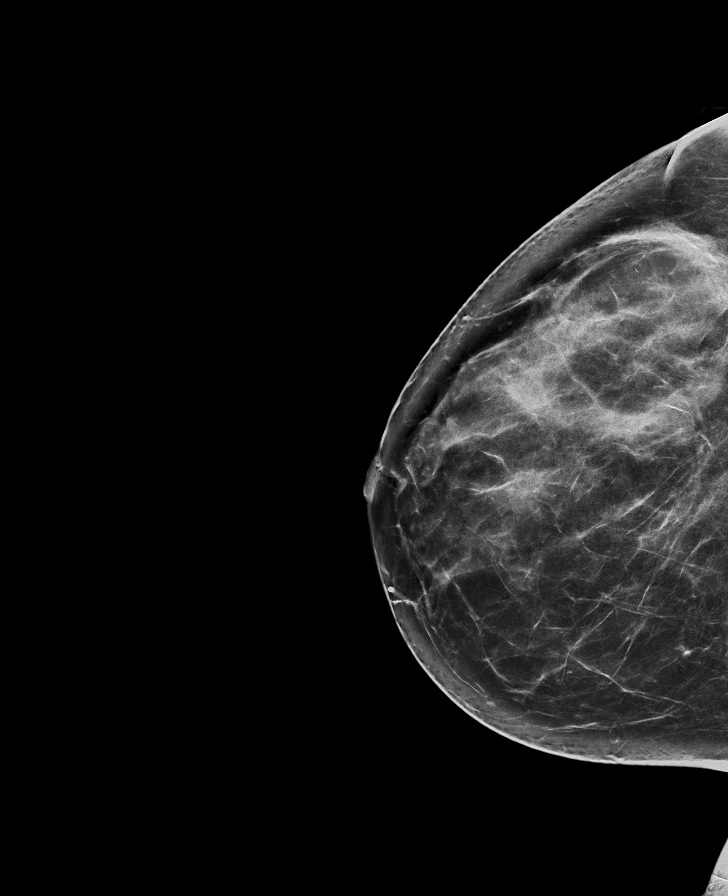

[R MLO synth-2D]
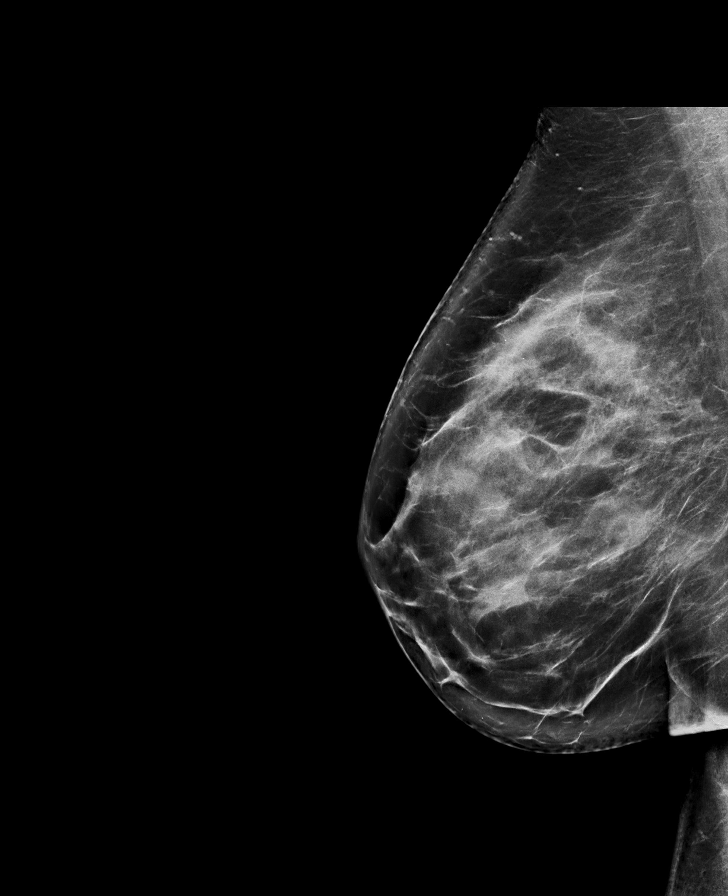

[L CC tomo · tomo slice 45/88.0]
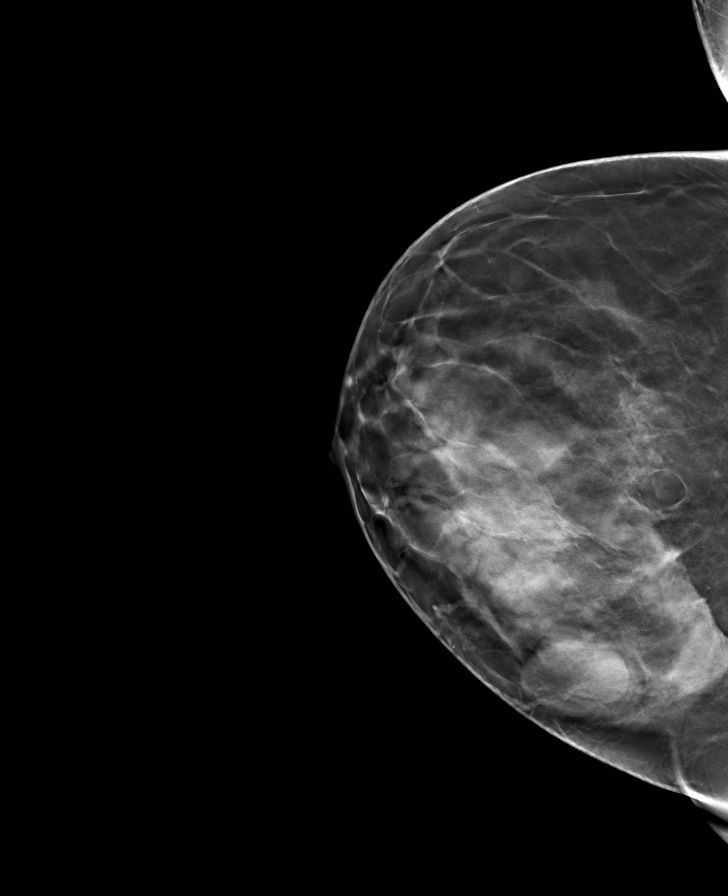

[R MLO tomo · tomo slice 49/98.0]
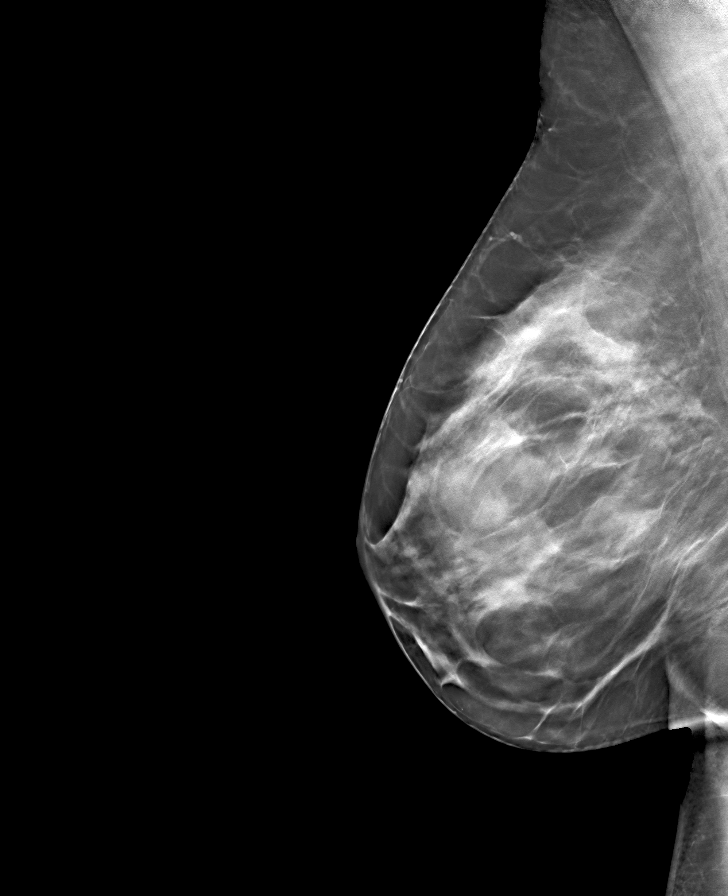

[R CC tomo · tomo slice 45/90.0]
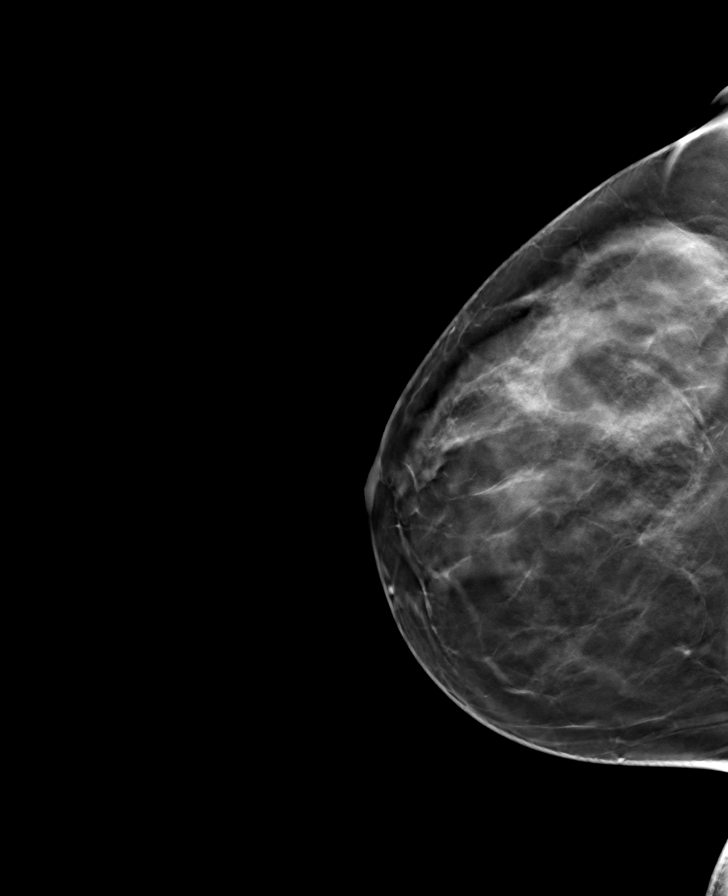

[L MLO tomo · tomo slice 47/93.0]
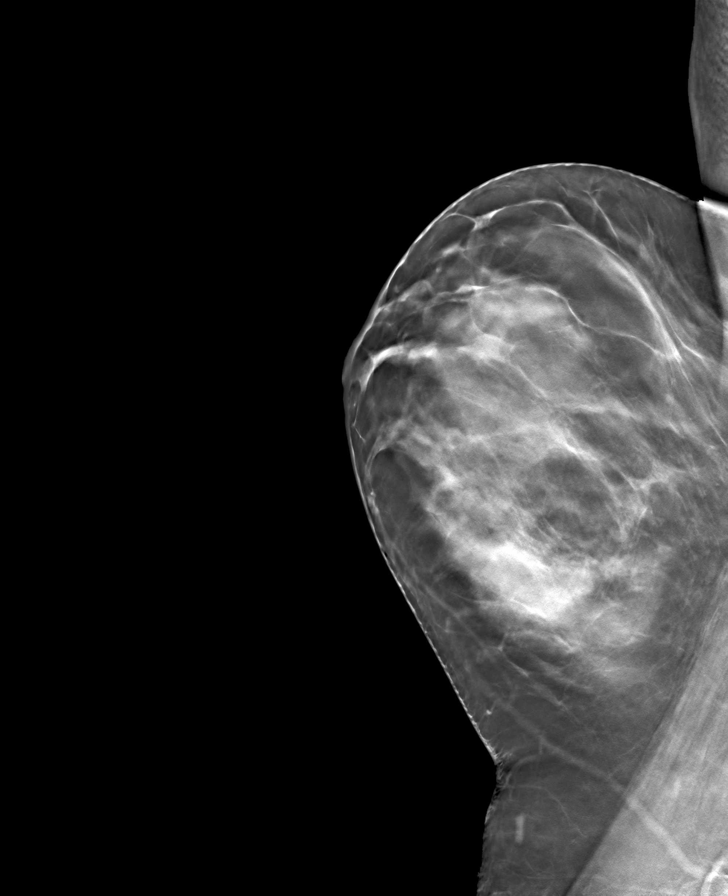

[8 of 24 positions shown; findings below may reference images not displayed]

ACR Breast Density Category c: The breast tissue is heterogeneously
dense, which may obscure small masses.
FINDINGS: In the right breast a possible mass requires further evaluation.

In the left breast the group of calcifications requires further
evaluation.

Images were processed with CAD.
IMPRESSION: Further evaluation is suggested for a possible mass in the right
breast.

Further evaluation is suggested for calcifications in the left
breast.

RECOMMENDATION:
Diagnostic mammogram and possibly ultrasound of both breasts.
(Code:[id number])

The patient will be contacted regarding the findings, and additional
imaging will be scheduled.

BI-RADS CATEGORY  0: Incomplete. Need additional imaging evaluation
and/or prior mammograms for comparison.

## 2018-11-05 NOTE — Progress Notes (Addendum)
PATIENT: Brittany Davila DOB: 1975/08/04  REASON FOR VISIT: follow up HISTORY FROM: patient  HISTORY OF PRESENT ILLNESS: Today 11/06/18  HISTORY  Brittany Davila is a 43 year old female, seen in request by her primary care physician Dr. Lind Guest for evaluation of headaches initial evaluation was on September 05, 2018.  I have reviewed and summarized the referring note from the referring physician.  She had a past medical history of bipolar disorder, has been on stable dose of Lamictal 150 mg twice a day, she began to have migraine headaches since 2018, increased frequency over the past 3 months, she complains of dizziness, especially with sudden positional change, such as bending over, losing her balance easily, she also complains of intermittent right face icy cold sensation, numbness, sometimes she feels her body spinning back-and-forth with movement, she also has bilateral frontal retro-orbital area pressure headaches, she has migraine headaches about couple times each week, she describes migraine as right retro-orbital area severe pounding headache with sensitive to light and noise, 2 to 3 days, she has tried Excedrin Migraine and muscle relaxant with some helps, in addition she complains of intermittent left arm and leg pain, low back pain,  Update November 06, 2018 SS: MRI of the brain 10/02/2018 was normal.   Since starting propanolol, is now having less intense, dull headaches, 2-3 times a week.  It does not prevent her from doing anything.  The headache is located right occipitally, spreads forward, Icy/cold sensation to her right face with headache.  She has history of right-sided Bell's palsy when she was in college, feels that same sensation.  Imitrex has been beneficial, but rarely has to take it.  She will now take Excedrin Migraine with modest benefit.  She reports stress, works full-time, has a 42 year old, taking 2 college classes.  With her headaches, she reports photophobia,  phonophobia, at times nausea.  She denies weakness or drooping of the face.  She reports 2 years ago, she developed left-sided low back pain, numbness to her left leg, pain down her leg, hot/cold sensation to her left calf, behind her left knee.  At times, her left arm will go numb.  She says she feels the best when she is standing.  She has had MRI of her lumbar spine that was normal.  She completed a few sessions of physical therapy that were beneficial.  She presents today for follow-up unaccompanied.  REVIEW OF SYSTEMS: Out of a complete 14 system review of symptoms, the patient complains only of the following symptoms, and all other reviewed systems are negative.  Memory loss, dizziness, headache, numbness, weakness  ALLERGIES: Allergies  Allergen Reactions   Pollen Extract Other (See Comments)    HOME MEDICATIONS: Outpatient Medications Prior to Visit  Medication Sig Dispense Refill   ALPRAZolam (XANAX) 1 MG tablet Take 1 mg by mouth 2 (two) times daily as needed for anxiety.     fexofenadine (ALLEGRA) 180 MG tablet Take 180 mg by mouth daily.     lamoTRIgine (LAMICTAL) 100 MG tablet Take 150 mg by mouth daily.   5   propranolol (INDERAL) 40 MG tablet Take 1 tablet (40 mg total) by mouth 2 (two) times daily. 60 tablet 11   SUMAtriptan (IMITREX) 50 MG tablet Take 1 tablet (50 mg total) by mouth every 2 (two) hours as needed for migraine. May repeat in 2 hours if headache persists or recurs. 10 tablet 11   zolpidem (AMBIEN CR) 12.5 MG CR tablet Take 12.5  mg by mouth at bedtime.  2   No facility-administered medications prior to visit.     PAST MEDICAL HISTORY: Past Medical History:  Diagnosis Date   Anxiety    Basal cell carcinoma (BCC) of face    Bell's palsy    H/O   Bipolar 1 disorder ([place])    Depression    Headache    MIGRAINES   History of genital warts     PAST SURGICAL HISTORY: Past Surgical History:  Procedure Laterality Date   BREAST BIOPSY Right  07/03/2018   [place] bx           heart marker              path pending   BREAST CYST ASPIRATION Left 2014   by Dr. Tamala Julian   LAPAROSCOPIC TUBAL LIGATION Bilateral 11/10/2016   Procedure: LAPAROSCOPIC TUBAL LIGATION;  Surgeon: Benjaman Kindler, MD;  Location: [facility] ORS;  Service: Gynecology;  Laterality: Bilateral;   MOHS SURGERY     TONSILLECTOMY AND ADENOIDECTOMY     tubes in right ear      FAMILY HISTORY: Family History  Problem Relation Age of Onset   Diabetes Maternal Grandfather    Breast cancer Paternal Grandmother    Colon cancer Paternal Grandfather    Pancreatic cancer Paternal Grandfather    Lung cancer Maternal Aunt     SOCIAL HISTORY: Social History   Socioeconomic History   Marital status: Married    Spouse name: Not on file   Number of children: Not on file   Years of education: Not on file   Highest education level: Not on file  Occupational History   Not on file  Social Needs   Financial resource strain: Not on file   Food insecurity    Worry: Not on file    Inability: Not on file   Transportation needs    Medical: Not on file    Non-medical: Not on file  Tobacco Use   Smoking status: Never Smoker   Smokeless tobacco: Never Used  Substance and Sexual Activity   Alcohol use: Yes    Alcohol/week: 0.0 standard drinks    Comment: occasional   Drug use: No   Sexual activity: Never  Lifestyle   Physical activity    Days per week: Not on file    Minutes per session: Not on file   Stress: Not on file  Relationships   Social connections    Talks on phone: Not on file    Gets together: Not on file    Attends religious service: Not on file    Active member of club or organization: Not on file    Attends meetings of clubs or organizations: Not on file    Relationship status: Not on file   Intimate partner violence    Fear of current or ex partner: Not on file    Emotionally abused: Not on file    Physically abused: Not on file      Forced sexual activity: Not on file  Other Topics Concern   Not on file  Social History Narrative   Not on file    PHYSICAL EXAM  Vitals:   11/06/18 0807  BP: 106/72  Pulse: 84  Temp: (!) 97.3 F (36.3 C)  TempSrc: Oral  Weight: 198 lb (89.8 kg)  Height: 5\' 9"  (1.753 m)   Body mass index is 29.24 kg/m.  Generalized: Well developed, in no acute distress   Neurological examination  Mentation:  Alert oriented to time, place, history taking. Follows all commands speech and language fluent Cranial nerve II-XII: Pupils were equal round reactive to light. Extraocular movements were full, visual field were full on confrontational test. Facial sensation and strength were normal. Head turning and shoulder shrug  were normal and symmetric. Motor: The motor testing reveals 5 over 5 strength of all 4 extremities. Good symmetric motor tone is noted throughout.  Sensory: Reports decreased sensation to soft touch to right face V1-V3, decreased pinprick sensation left lower extremity up to left thigh Coordination: Cerebellar testing reveals good finger-nose-finger and heel-to-shin bilaterally.  Gait and station: Gait is normal. Tandem gait is normal. Romberg is negative. No drift is seen. Able to walk on heels and tiptoes without problem. Reflexes: Deep tendon reflexes are symmetric and normal bilaterally.   DIAGNOSTIC DATA (LABS, IMAGING, TESTING) - I reviewed patient records, labs, notes, testing and imaging myself where available.  Lab Results  Component Value Date   WBC 11.5 (H) 03/08/2017   HGB 13.7 03/08/2017   HCT 41.4 03/08/2017   MCV 91.0 03/08/2017   PLT 330.0 03/08/2017      Component Value Date/Time   NA 139 08/16/2017 1423   K 3.8 08/16/2017 1423   CL 103 08/16/2017 1423   CO2 30 08/16/2017 1423   GLUCOSE 99 08/16/2017 1423   BUN 11 08/16/2017 1423   CREATININE 0.97 08/16/2017 1423   CALCIUM 9.4 08/16/2017 1423   PROT 7.5 03/08/2017 1005   ALBUMIN 4.2  03/08/2017 1005   AST 14 03/08/2017 1005   ALT 11 03/08/2017 1005   ALKPHOS 59 03/08/2017 1005   BILITOT 0.3 03/08/2017 1005   GFRNONAA >60 02/18/2017 1320   GFRAA >60 02/18/2017 1320   Lab Results  Component Value Date   CHOL 165 03/08/2017   HDL 41.50 03/08/2017   LDLCALC 91 03/08/2017   TRIG 162.0 (H) 03/08/2017   CHOLHDL 4 03/08/2017   Lab Results  Component Value Date   HGBA1C 5.7 03/08/2017   Lab Results  Component Value Date   VITAMINB12 269 08/16/2017   Lab Results  Component Value Date   TSH 2.15 08/16/2017      ASSESSMENT AND PLAN 43 y.o. year old female  has a past medical history of Anxiety, Basal cell carcinoma (BCC) of face, Bell's palsy, Bipolar 1 disorder ([place]), Depression, Headache, and History of genital warts. here with:  1.  Chronic migraine headache -MRI of the brain was normal 10/03/2018 -Improvement with propanolol, 2-3 dull headaches a week, rarely taking Imitrex -Increase propanolol 60 mg twice a day, good for her anxiety as well  -Continue Imitrex as needed  2.  Paresthesia left arm and leg -Denies neck pain, MRI lumbar spine 11/2017 was normal -I will order EMG nerve conduction study -Return in 4 months or sooner if needed (Dr. Krista Blue may push appointment out depending on if any changes made during nerve conduction)  3.  Bipolar disorder, anxiety -Taking Lamictal 150 mg daily  I spent 25 minutes with the patient. 50% of this time was spent discussing her plan of care.   Butler Denmark, AGNP-C, DNP 11/06/2018, 8:16 AM [facility] [address], [place], [place] [address] [phone]

## 2018-11-06 ENCOUNTER — Other Ambulatory Visit: Payer: Self-pay

## 2018-11-06 ENCOUNTER — Ambulatory Visit: Payer: BC Managed Care – PPO | Admitting: Neurology

## 2018-11-06 ENCOUNTER — Encounter: Payer: Self-pay | Admitting: Neurology

## 2018-11-06 VITALS — BP 106/72 | HR 84 | Temp 97.3°F | Ht 69.0 in | Wt 198.0 lb

## 2018-11-06 DIAGNOSIS — R202 Paresthesia of skin: Secondary | ICD-10-CM | POA: Diagnosis not present

## 2018-11-06 DIAGNOSIS — R519 Headache, unspecified: Secondary | ICD-10-CM

## 2018-11-06 MED ORDER — PROPRANOLOL HCL 60 MG PO TABS: 60.0000 mg | ORAL_TABLET | Freq: Two times a day (BID) | ORAL | 11 refills | Status: DC

## 2018-11-06 NOTE — Patient Instructions (Signed)
1. Increase to propranolol 60 mg twice a day 2. Continue Imitrex as needed 3. I will order Nerve Conduction study to evaluate your symptoms in your arm and leg

## 2018-11-06 NOTE — Progress Notes (Signed)
I have reviewed and agreed above plan. 

## 2018-11-13 ENCOUNTER — Telehealth: Payer: Self-pay | Admitting: Neurology

## 2018-11-13 NOTE — Telephone Encounter (Signed)
Patient mentioned that she has no sensation of pain. She stated that when she pinches herself she feel the pinch but not the pain that's associated with a pinch. She stated that she has this feeling throughout her entire body. She stated that it started 2 days ago. She hasn't done anything new or different out of the ordinary. Please advise

## 2018-11-13 NOTE — Telephone Encounter (Signed)
Pt states she has been feeling numbness through out her body in the last two days and she would like to discuss this with the RN. Please advise.

## 2018-11-13 NOTE — Telephone Encounter (Signed)
I called the patient.  She reports for the last 2 days if she pinches herself in her arms and legs, she feels the sensation is not as strong.  She denies numbness or tingling per se, but feels her pain sensation is not as strong.  She does not have any weakness of her arms and legs.  There is no change to the bowels or bladder.  Her speech is clear and concise.  She has not had any falls.  She does wonder if her B12 level may be low.  Was checked in July 2019, with lower end of normal 269.  She may try an over-the-counter vitamin B supplement to see if that is beneficial.  Otherwise, closely monitor her symptoms.  She is scheduled for a nerve conduction study in November.

## 2018-11-18 ENCOUNTER — Telehealth: Payer: Self-pay | Admitting: Neurology

## 2018-11-18 MED ORDER — ONDANSETRON HCL 4 MG PO TABS: 4.0000 mg | ORAL_TABLET | Freq: Three times a day (TID) | ORAL | 3 refills | Status: DC | PRN

## 2018-11-18 NOTE — Telephone Encounter (Signed)
Pt states as a result of her migraines she is having nausea and vomiting.  Pt is asking if she could get a call with a suggested OTC or a prescription for something she can take.  Please call

## 2018-11-18 NOTE — Telephone Encounter (Signed)
I called the patient. She reports n/v with her headaches. I have sent in Zofran.

## 2018-11-18 NOTE — Addendum Note (Signed)
Addended by: Suzzanne Cloud on: 11/18/2018 03:30 PM   Modules accepted: Orders

## 2018-11-18 NOTE — Telephone Encounter (Signed)
I called pt and she has nausea and vomiting with her migraines.  She has not taken anything for this before.  Had phenergan when pregnant. Please advise.

## 2018-11-19 ENCOUNTER — Telehealth: Payer: Self-pay | Admitting: Internal Medicine

## 2018-11-19 NOTE — Telephone Encounter (Signed)
I'm not sure what she is referring to? She had a UTI that we treated a few months ago. Can we get more info on why she wants/needs this referral.

## 2018-11-19 NOTE — Telephone Encounter (Signed)
Pt is wanting a referral for Urologist. She said she has seen [place] before about the issue.   CB [phone]

## 2018-11-27 LAB — HM PAP SMEAR: HM Pap smear: NEGATIVE

## 2018-11-27 NOTE — Telephone Encounter (Signed)
We need to discuss. We don't send pt's to urology for asymptomatic kidney stones and she technically doesn't get recurrent UTI"S.

## 2018-11-27 NOTE — Telephone Encounter (Signed)
Spoke to pt and she states in the past she was told she had kidney stone.... pt states she has been seeing Neuro and they cannot seem to have any answers for her paresthesia and wanted to look into the factor of possible kidney stone issue could be contributing and also she has had quite a few UTIs and would like to have that looked into... pt is available for video virtual visit to discuss tomorrow afternoon if needed...Marland Kitchen please advise

## 2018-12-25 ENCOUNTER — Encounter: Payer: Self-pay | Admitting: Neurology

## 2019-01-08 ENCOUNTER — Other Ambulatory Visit: Payer: Self-pay | Admitting: Adult Health

## 2019-01-08 MED ORDER — ALPRAZOLAM 1 MG PO TABS: ORAL_TABLET | ORAL | 2 refills | Status: DC

## 2019-01-08 MED ORDER — LAMOTRIGINE 100 MG PO TABS: ORAL_TABLET | ORAL | 1 refills | Status: DC

## 2019-01-13 ENCOUNTER — Other Ambulatory Visit: Payer: Self-pay

## 2019-01-13 ENCOUNTER — Ambulatory Visit (INDEPENDENT_AMBULATORY_CARE_PROVIDER_SITE_OTHER): Payer: BC Managed Care – PPO | Admitting: Neurology

## 2019-01-13 ENCOUNTER — Encounter: Payer: BC Managed Care – PPO | Admitting: Neurology

## 2019-01-13 DIAGNOSIS — R202 Paresthesia of skin: Secondary | ICD-10-CM

## 2019-01-13 DIAGNOSIS — Z0289 Encounter for other administrative examinations: Secondary | ICD-10-CM

## 2019-01-14 NOTE — Procedures (Signed)
Full Name: Brittany Davila Gender: Female MRN #: [id number] Date of Birth: Apr 21, 1975    Visit Date: 01/13/2019 09:16 Age: 43 Years Examining Physician: Marcial Pacas, MD  Referring Physician: Butler Denmark, NP History:   43 years old female, complains of left arm and leg paresthesia  Summary of the tests: Nerve conduction study: Left sural, superficial peroneal, median, ulnar sensory responses were normal. Left tibial, peroneal to EDB, median, and ulnar motor responses were normal  Electromyography: Selected needle examination of left upper, lower extremity muscles, left cervical and lumbar paraspinal muscles were normal.  Conclusion: This is a normal study.  There is no electrodiagnostic evidence of large fiber peripheral neuropathy, left cervical radiculopathy, or left lumbosacral radiculopathy.    ------------------------------- Marcial Pacas M.D. PhD [facility] [place], [place] [address] Tel: [phone] Fax: [phone]         [facility]    Nerve / Sites Muscle Latency Ref. Amplitude Ref. Rel Amp Segments Distance Velocity Ref. Area    ms ms mV mV %  cm m/s m/s mVms  L Median - APB     Wrist APB 2.9 ?4.4 6.2 ?4.0 100 Wrist - APB 7   24.1     Upper arm APB 6.9  6.1  98.1 Upper arm - Wrist 23 58 ?49 23.5  L Ulnar - ADM     Wrist ADM 2.5 ?3.3 9.4 ?6.0 100 Wrist - ADM 7   37.7     B.Elbow ADM 5.5  9.5  101 B.Elbow - Wrist 18 60 ?49 37.6     A.Elbow ADM 7.2  9.3  98.1 A.Elbow - B.Elbow 10 61 ?49 37.0         A.Elbow - Wrist      L Peroneal - EDB     Ankle EDB 3.3 ?6.5 7.7 ?2.0 100 Ankle - EDB 9   25.7     Fib head EDB 9.4  6.8  87.9 Fib head - Ankle 32 53 ?44 23.4     Pop fossa EDB 11.2  6.7  99.6 Pop fossa - Fib head 10 55 ?44 23.2         Pop fossa - Ankle      L Tibial - AH     Ankle AH 3.2 ?5.8 22.1 ?4.0 100 Ankle - AH 9   40.4     Pop fossa AH 12.0  16.0  72.2 Pop fossa - Ankle 40 46 ?41 38.1             SNC    Nerve / Sites Rec.  Site Peak Lat Ref.  Amp Ref. Segments Distance    ms ms V V  cm  L Sural - Ankle (Calf)     Calf Ankle 3.3 ?4.4 11 ?6 Calf - Ankle 14  L Superficial peroneal - Ankle     Lat leg Ankle 3.6 ?4.4 12 ?6 Lat leg - Ankle 14  L Median - Orthodromic (Dig II, Mid palm)     Dig II Wrist 2.9 ?3.4 26 ?10 Dig II - Wrist 13  L Ulnar - Orthodromic, (Dig V, Mid palm)     Dig V Wrist 2.6 ?3.1 9 ?5 Dig V - Wrist 60             F  Wave    Nerve F Lat Ref.   ms ms  L Tibial - AH 51.3 ?56.0  L Ulnar - ADM 28.4 ?32.0  EMG Summary Table    Spontaneous MUAP Recruitment  Muscle IA Fib PSW Fasc Other Amp Dur. Poly Pattern  L. First dorsal interosseous Normal None None None _______ Normal Normal Normal Normal  L. Pronator teres Normal None None None _______ Normal Normal Normal Normal  L. Biceps brachii Normal None None None _______ Normal Normal Normal Normal  L. Deltoid Normal None None None _______ Normal Normal Normal Normal  L. Triceps brachii Normal None None None _______ Normal Normal Normal Normal  L. Tibialis posterior Normal None None None _______ Normal Normal Normal Normal  L. Tibialis anterior Normal None None None _______ Normal Normal Normal Normal  L. Peroneus longus Normal None None None _______ Normal Normal Normal Normal  L. Gastrocnemius (Medial head) Normal None None None _______ Normal Normal Normal Normal  L. Vastus lateralis Normal None None None _______ Normal Normal Normal Normal  R. Lumbar paraspinals (mid) Normal None None None _______ Normal Normal Normal Normal  R. Lumbar paraspinals (low) Normal None None None _______ Normal Normal Normal Normal  R. Cervical paraspinals Normal None None None _______ Normal Normal Normal Normal

## 2019-01-15 NOTE — Telephone Encounter (Signed)
Again, she need an appt to discuss. Kidney stones do not cause nerve pain

## 2019-01-15 NOTE — Telephone Encounter (Signed)
Pt called back aware of regina's comment  Pt stated she has had mri and nerve pain study these were normal.  She is still having nerve pain on left side and she is wondering if it is kidney stone and would like referral to urology

## 2019-01-20 ENCOUNTER — Other Ambulatory Visit: Payer: Self-pay

## 2019-01-20 ENCOUNTER — Ambulatory Visit (INDEPENDENT_AMBULATORY_CARE_PROVIDER_SITE_OTHER): Payer: BC Managed Care – PPO | Admitting: Adult Health

## 2019-01-20 ENCOUNTER — Encounter: Payer: Self-pay | Admitting: Adult Health

## 2019-01-20 DIAGNOSIS — G4700 Insomnia, unspecified: Secondary | ICD-10-CM

## 2019-01-20 DIAGNOSIS — F319 Bipolar disorder, unspecified: Secondary | ICD-10-CM

## 2019-01-20 DIAGNOSIS — F331 Major depressive disorder, recurrent, moderate: Secondary | ICD-10-CM | POA: Diagnosis not present

## 2019-01-20 DIAGNOSIS — F411 Generalized anxiety disorder: Secondary | ICD-10-CM | POA: Diagnosis not present

## 2019-01-20 MED ORDER — ARIPIPRAZOLE 2 MG PO TABS: 2.0000 mg | ORAL_TABLET | Freq: Every day | ORAL | 2 refills | Status: DC

## 2019-01-20 MED ORDER — LAMOTRIGINE 150 MG PO TABS: ORAL_TABLET | ORAL | 5 refills | Status: DC

## 2019-01-20 MED ORDER — ALPRAZOLAM 1 MG PO TABS: ORAL_TABLET | ORAL | 2 refills | Status: DC

## 2019-01-20 MED ORDER — GABAPENTIN 100 MG PO CAPS: 100.0000 mg | ORAL_CAPSULE | Freq: Three times a day (TID) | ORAL | 2 refills | Status: DC

## 2019-01-20 MED ORDER — ZOLPIDEM TARTRATE ER 12.5 MG PO TBCR: 12.5000 mg | EXTENDED_RELEASE_TABLET | Freq: Every day | ORAL | 2 refills | Status: DC

## 2019-01-20 NOTE — Progress Notes (Signed)
Brittany Davila [id number] 21-Feb-1975 43 y.o.  Subjective:   Patient ID:  Brittany Davila is a 43 y.o. (DOB 06/20/1975) female.  Chief Complaint: No chief complaint on file.   HPI [place] presents to the office today for follow-up of GAD, MDD, insomnia, and BPD.   Describes mood today as "ok". Pleasant. Mood symptoms - denies depression and anxiety. Having "good and bad days". Stating "my irritability and anger are off the chart". She and daughter always "bickering". Sent daughter to grandmother's for 2 weeks recently - "we were arguing all the time". Daughter also a patient at the clinic - just started therapy and medication management. Increased nerve pain - "hurting a lot" Pain keeping her from sleeping well.  Stable interest and motivation. Taking medications as prescribed.  Energy levels stable. Active, does not have a regular exercise routine. Works full-time. Full time student - graduate school. Enjoys some usual interests and activities. Married. Lives with husband 73 year old daughter. Spending time with family. Plans to see extended family over the holidays. Appetite adequate. Weight stable. Sleeps well most nights. Averages 4 to 5 hours. Uses Ambien 12.5mg  some nights.  Focus and concentration stable. Completing tasks. Managing aspects of household.  Denies SI or HI. Denies AH or VH.  PHQ2-9     Office Visit from 11/19/2017 in [place] at [facility] Visit from 03/08/2017 in [place] at [facility] Visit from 12/25/2016 in [place] at [facility] Total Score  0  0  0       Review of Systems:  Review of Systems  Musculoskeletal: Negative for gait problem.  Neurological: Negative for tremors.  Psychiatric/Behavioral:       Please refer to HPI    Medications: I have reviewed the patient's current medications.  Current Outpatient Medications  Medication Sig Dispense Refill  . ALPRAZolam (XANAX) 1 MG tablet Take 1/2 tablet  twice daily. 30 tablet 2  . ARIPiprazole (ABILIFY) 2 MG tablet Take 1 tablet (2 mg total) by mouth daily. 30 tablet 2  . fexofenadine (ALLEGRA) 180 MG tablet Take 180 mg by mouth daily.    Marland Kitchen gabapentin (NEURONTIN) 100 MG capsule Take 1 capsule (100 mg total) by mouth 3 (three) times daily. 90 capsule 2  . lamoTRIgine (LAMICTAL) 150 MG tablet Take one tablet daily. 30 tablet 5  . ondansetron (ZOFRAN) 4 MG tablet Take 1 tablet (4 mg total) by mouth every 8 (eight) hours as needed for nausea or vomiting. 20 tablet 3  . propranolol (INDERAL) 60 MG tablet Take 1 tablet (60 mg total) by mouth 2 (two) times daily. 60 tablet 11  . SUMAtriptan (IMITREX) 50 MG tablet Take 1 tablet (50 mg total) by mouth every 2 (two) hours as needed for migraine. May repeat in 2 hours if headache persists or recurs. 10 tablet 11  . zolpidem (AMBIEN CR) 12.5 MG CR tablet Take 1 tablet (12.5 mg total) by mouth at bedtime. 30 tablet 2   No current facility-administered medications for this visit.    Medication Side Effects: None  Allergies:  Allergies  Allergen Reactions  . Pollen Extract Other (See Comments)    Past Medical History:  Diagnosis Date  . Anxiety   . Basal cell carcinoma (BCC) of face   . Bell's palsy    H/O  . Bipolar 1 disorder ([place])   . Depression   . Headache    MIGRAINES  . History of genital warts  Family History  Problem Relation Age of Onset  . Diabetes Maternal Grandfather   . Breast cancer Paternal Grandmother   . Colon cancer Paternal Grandfather   . Pancreatic cancer Paternal Grandfather   . Lung cancer Maternal Aunt     Social History   Socioeconomic History  . Marital status: Married    Spouse name: Not on file  . Number of children: Not on file  . Years of education: Not on file  . Highest education level: Not on file  Occupational History  . Not on file  Tobacco Use  . Smoking status: Never Smoker  . Smokeless tobacco: Never Used  Substance and Sexual  Activity  . Alcohol use: Yes    Alcohol/week: 0.0 standard drinks    Comment: occasional  . Drug use: No  . Sexual activity: Never  Other Topics Concern  . Not on file  Social History Narrative  . Not on file   Social Determinants of Health   Financial Resource Strain:   . Difficulty of Paying Living Expenses: Not on file  Food Insecurity:   . Worried About Charity fundraiser in the Last Year: Not on file  . Ran Out of Food in the Last Year: Not on file  Transportation Needs:   . Lack of Transportation (Medical): Not on file  . Lack of Transportation (Non-Medical): Not on file  Physical Activity:   . Days of Exercise per Week: Not on file  . Minutes of Exercise per Session: Not on file  Stress:   . Feeling of Stress : Not on file  Social Connections:   . Frequency of Communication with Friends and Family: Not on file  . Frequency of Social Gatherings with Friends and Family: Not on file  . Attends Religious Services: Not on file  . Active Member of Clubs or Organizations: Not on file  . Attends Archivist Meetings: Not on file  . Marital Status: Not on file  Intimate Partner Violence:   . Fear of Current or Ex-Partner: Not on file  . Emotionally Abused: Not on file  . Physically Abused: Not on file  . Sexually Abused: Not on file    Past Medical History, Surgical history, Social history, and Family history were reviewed and updated as appropriate.   Please see review of systems for further details on the patient's review from today.   Objective:   Physical Exam:  There were no vitals taken for this visit.  Physical Exam Constitutional:      General: She is not in acute distress.    Appearance: She is well-developed.  Musculoskeletal:        General: No deformity.  Neurological:     Mental Status: She is alert and oriented to person, place, and time.     Coordination: Coordination normal.  Psychiatric:        Attention and Perception: Attention and  perception normal. She does not perceive auditory or visual hallucinations.        Mood and Affect: Mood is anxious and depressed. Affect is not labile, blunt, angry or inappropriate.        Speech: Speech normal.        Behavior: Behavior normal.        Thought Content: Thought content normal. Thought content is not paranoid or delusional. Thought content does not include homicidal or suicidal ideation. Thought content does not include homicidal or suicidal plan.        Cognition and  Memory: Cognition and memory normal.        Judgment: Judgment normal.     Comments: Insight intact     Lab Review:     Component Value Date/Time   NA 139 08/16/2017 1423   K 3.8 08/16/2017 1423   CL 103 08/16/2017 1423   CO2 30 08/16/2017 1423   GLUCOSE 99 08/16/2017 1423   BUN 11 08/16/2017 1423   CREATININE 0.97 08/16/2017 1423   CALCIUM 9.4 08/16/2017 1423   PROT 7.5 03/08/2017 1005   ALBUMIN 4.2 03/08/2017 1005   AST 14 03/08/2017 1005   ALT 11 03/08/2017 1005   ALKPHOS 59 03/08/2017 1005   BILITOT 0.3 03/08/2017 1005   GFRNONAA >60 02/18/2017 1320   GFRAA >60 02/18/2017 1320       Component Value Date/Time   WBC 11.5 (H) 03/08/2017 1005   RBC 4.55 03/08/2017 1005   HGB 13.7 03/08/2017 1005   HCT 41.4 03/08/2017 1005   PLT 330.0 03/08/2017 1005   MCV 91.0 03/08/2017 1005   MCH 30.6 02/18/2017 1320   MCHC 33.0 03/08/2017 1005   RDW 13.2 03/08/2017 1005    No results found for: POCLITH, LITHIUM   No results found for: PHENYTOIN, PHENOBARB, VALPROATE, CBMZ   .res Assessment: Plan:    Plan:  1. Ambien CR 12.5mg  at hs 2. Xanax 1mg  - 1/2 tab BID 3. Lamictal 150mg  at hs 4. Add Gabapentin 100mg  TID 5. Add Abilify 2mg  daily  Will fill out FMLA.   RTC 4 weeks  Patient advised to contact office with any questions, adverse effects, or acute worsening in signs and symptoms.  Diagnoses and all orders for this visit:  Bipolar 1 disorder ([place]) -     lamoTRIgine (LAMICTAL) 150 MG  tablet; Take one tablet daily. -     ARIPiprazole (ABILIFY) 2 MG tablet; Take 1 tablet (2 mg total) by mouth daily.  Insomnia, unspecified type -     zolpidem (AMBIEN CR) 12.5 MG CR tablet; Take 1 tablet (12.5 mg total) by mouth at bedtime.  Major depressive disorder, recurrent episode, moderate (HCC) -     lamoTRIgine (LAMICTAL) 150 MG tablet; Take one tablet daily.  Generalized anxiety disorder -     ALPRAZolam (XANAX) 1 MG tablet; Take 1/2 tablet twice daily. -     gabapentin (NEURONTIN) 100 MG capsule; Take 1 capsule (100 mg total) by mouth 3 (three) times daily.     Please see After Visit Summary for patient specific instructions.  Future Appointments  Date Time Provider [place]  03/11/2019  7:45 AM Suzzanne Cloud, NP [facility] None  04/23/2019  8:20 AM [place] Seaberg, Berdie Ogren, NP [facility] None    No orders of the defined types were placed in this encounter.   -------------------------------

## 2019-02-12 ENCOUNTER — Other Ambulatory Visit: Payer: Self-pay | Admitting: Adult Health

## 2019-02-12 DIAGNOSIS — F319 Bipolar disorder, unspecified: Secondary | ICD-10-CM

## 2019-03-11 ENCOUNTER — Ambulatory Visit: Payer: BC Managed Care – PPO | Admitting: Neurology

## 2019-03-11 ENCOUNTER — Other Ambulatory Visit: Payer: Self-pay

## 2019-03-11 ENCOUNTER — Encounter: Payer: Self-pay | Admitting: Neurology

## 2019-03-11 VITALS — BP 107/68 | HR 68 | Temp 97.3°F | Ht 69.0 in | Wt 204.4 lb

## 2019-03-11 DIAGNOSIS — R519 Headache, unspecified: Secondary | ICD-10-CM | POA: Diagnosis not present

## 2019-03-11 DIAGNOSIS — R202 Paresthesia of skin: Secondary | ICD-10-CM

## 2019-03-11 NOTE — Patient Instructions (Signed)
We will get MRI cervical and thoracic spine. Continue taking the propranolol. See you back 4-6 months

## 2019-03-11 NOTE — Progress Notes (Signed)
PATIENT: Brittany Davila DOB: 1975/02/17  REASON FOR VISIT: follow up HISTORY FROM: patient  HISTORY OF PRESENT ILLNESS: Today 03/11/19  HISTORY HISTORY  Brittany Davila a 44 year old female, seen in request byher primary care physician Dr.Marcellino, Brittany Davila evaluation of headaches initial evaluation was on September 05, 2018.  I have reviewed and summarized the referring note from the referring physician.She had a past medical history of bipolar disorder, has been on stable dose of Lamictal 150 mg twice a day, she began to have migraine headaches since 2018, increased frequency over the past 3 months, she complains of dizziness, especially with sudden positional change, such as bending over, losing her balance easily, she also complains of intermittent right face icy cold sensation, numbness, sometimes she feels her body spinning back-and-forth with movement, she also has bilateral frontal retro-orbital area pressure headaches, she has migraine headaches about couple times each week, she describes migraine as right retro-orbital area severe pounding headache with sensitive to light and noise, 2 to 3 days, she has tried Excedrin Migraine and muscle relaxant with some helps, in addition she complains of intermittent left arm and leg pain, low back pain,  Update November 06, 2018 SS: MRI of the brain 10/02/2018 was normal.   Since starting propanolol, is now having less intense, dull headaches, 2-3 times a week.  It does not prevent her from doing anything.  The headache is located right occipitally, spreads forward, Icy/cold sensation to her right face with headache.  She has history of right-sided Bell's palsy when she was in college, feels that same sensation.  Imitrex has been beneficial, but rarely has to take it.  She will now take Excedrin Migraine with modest benefit.  She reports stress, works full-time, has a 43 year old, taking 2 college classes.  With her headaches, she reports  photophobia, phonophobia, at times nausea.  She denies weakness or drooping of the face.  She reports 2 years ago, she developed left-sided low back pain, numbness to her left leg, pain down her leg, hot/cold sensation to her left calf, behind her left knee.  At times, her left arm will go numb.  She says she feels the best when she is standing.  She has had MRI of her lumbar spine that was normal.  She completed a few sessions of physical therapy that were beneficial.  She presents today for follow-up unaccompanied.   Update March 11, 2019 SS: At last visit propanolol was increased 60 mg twice a day, EMG nerve evaluation in December 2020 was normal of the left upper and lower extremity.  She continues to complain of numbness to her left leg, pain, hot/cold sensation, from her mid calf up to her mid thigh.  She does not experience the pain if she is up moving around.  It occurs during times of sitting.  The numbness to her left arm, is now very rare.  She denies bowel or urinary incontinence.  She has not had any falls.  She denies any back pain.  The physical therapy she did prior, was helpful for the back pain, not the leg symptoms.  Her headaches have improved with propanolol.  She says she may have 1 dull headache a week, that is relieved with Tylenol or Excedrin Migraine to her right occipital area.  She says also at night when lying flat, on her wedge pillow, she may have numbness to the right side of her face.  She is now taking gabapentin from her psychiatrist for irritability.  She has Abilify to take if the gabapentin does not work.  The gabapentin has also helped with her leg pain.  She presents today for evaluation unaccompanied.  REVIEW OF SYSTEMS: Out of a complete 14 system review of symptoms, the patient complains only of the following symptoms, and all other reviewed systems are negative.  Numbness  ALLERGIES: Allergies  Allergen Reactions  . Pollen Extract Other (See Comments)     HOME MEDICATIONS: Outpatient Medications Prior to Visit  Medication Sig Dispense Refill  . ALPRAZolam (XANAX) 1 MG tablet Take 1/2 tablet twice daily. 30 tablet 2  . gabapentin (NEURONTIN) 100 MG capsule Take 1 capsule (100 mg total) by mouth 3 (three) times daily. 90 capsule 2  . lamoTRIgine (LAMICTAL) 150 MG tablet Take one tablet daily. 30 tablet 5  . propranolol (INDERAL) 60 MG tablet Take 1 tablet (60 mg total) by mouth 2 (two) times daily. 60 tablet 11  . SUMAtriptan (IMITREX) 50 MG tablet Take 1 tablet (50 mg total) by mouth every 2 (two) hours as needed for migraine. May repeat in 2 hours if headache persists or recurs. 10 tablet 11  . zolpidem (AMBIEN CR) 12.5 MG CR tablet Take 1 tablet (12.5 mg total) by mouth at bedtime. 30 tablet 2  . ARIPiprazole (ABILIFY) 2 MG tablet TAKE 1 TABLET BY MOUTH EVERY DAY (Patient not taking: Reported on 03/11/2019) 90 tablet 1  . fexofenadine (ALLEGRA) 180 MG tablet Take 180 mg by mouth daily.    . ondansetron (ZOFRAN) 4 MG tablet Take 1 tablet (4 mg total) by mouth every 8 (eight) hours as needed for nausea or vomiting. 20 tablet 3   No facility-administered medications prior to visit.    PAST MEDICAL HISTORY: Past Medical History:  Diagnosis Date  . Anxiety   . Basal cell carcinoma (BCC) of face   . Bell's palsy    H/O  . Bipolar 1 disorder ([place])   . Depression   . Headache    MIGRAINES  . History of genital warts     PAST SURGICAL HISTORY: Past Surgical History:  Procedure Laterality Date  . BREAST BIOPSY Right 07/03/2018   [place] bx           heart marker              path pending  . BREAST CYST ASPIRATION Left 2014   by Dr. Tamala Julian  . LAPAROSCOPIC TUBAL LIGATION Bilateral 11/10/2016   Procedure: LAPAROSCOPIC TUBAL LIGATION;  Surgeon: Benjaman Kindler, MD;  Location: [facility] ORS;  Service: Gynecology;  Laterality: Bilateral;  . MOHS SURGERY    . TONSILLECTOMY AND ADENOIDECTOMY    . tubes in right ear      FAMILY HISTORY: Family  History  Problem Relation Age of Onset  . Diabetes Maternal Grandfather   . Breast cancer Paternal Grandmother   . Colon cancer Paternal Grandfather   . Pancreatic cancer Paternal Grandfather   . Lung cancer Maternal Aunt     SOCIAL HISTORY: Social History   Socioeconomic History  . Marital status: Married    Spouse name: Not on file  . Number of children: Not on file  . Years of education: Not on file  . Highest education level: Not on file  Occupational History  . Not on file  Tobacco Use  . Smoking status: Never Smoker  . Smokeless tobacco: Never Used  Substance and Sexual Activity  . Alcohol use: Yes    Alcohol/week: 0.0 standard drinks  Comment: occasional  . Drug use: No  . Sexual activity: Never  Other Topics Concern  . Not on file  Social History Narrative  . Not on file   Social Determinants of Health   Financial Resource Strain:   . Difficulty of Paying Living Expenses: Not on file  Food Insecurity:   . Worried About Charity fundraiser in the Last Year: Not on file  . Ran Out of Food in the Last Year: Not on file  Transportation Needs:   . Lack of Transportation (Medical): Not on file  . Lack of Transportation (Non-Medical): Not on file  Physical Activity:   . Days of Exercise per Week: Not on file  . Minutes of Exercise per Session: Not on file  Stress:   . Feeling of Stress : Not on file  Social Connections:   . Frequency of Communication with Friends and Family: Not on file  . Frequency of Social Gatherings with Friends and Family: Not on file  . Attends Religious Services: Not on file  . Active Member of Clubs or Organizations: Not on file  . Attends Archivist Meetings: Not on file  . Marital Status: Not on file  Intimate Partner Violence:   . Fear of Current or Ex-Partner: Not on file  . Emotionally Abused: Not on file  . Physically Abused: Not on file  . Sexually Abused: Not on file   PHYSICAL EXAM  Vitals:   03/11/19 0753   BP: 107/68  Pulse: 68  Temp: (!) 97.3 F (36.3 C)  Weight: 204 lb 6.4 oz (92.7 kg)  Height: 5\' 9"  (1.753 m)   Body mass index is 30.18 kg/m.  Generalized: Well developed, in no acute distress   Neurological examination  Mentation: Alert oriented to time, place, history taking. Follows all commands speech and language fluent Cranial nerve II-XII: Pupils were equal round reactive to light. Extraocular movements were full, visual field were full on confrontational test. Facial sensation and strength were normal. Head turning and shoulder shrug  were normal and symmetric. Motor: The motor testing reveals 5 over 5 strength of all 4 extremities. Good symmetric motor tone is noted throughout.  Sensory: Sensory testing is intact to soft touch on all 4 extremities. No evidence of extinction is noted.  Coordination: Cerebellar testing reveals good finger-nose-finger and heel-to-shin bilaterally.  Gait and station: Gait is normal. Tandem gait is normal. Romberg is negative. No drift is seen.  Reflexes: Deep tendon reflexes are symmetric and normal bilaterally.   DIAGNOSTIC DATA (LABS, IMAGING, TESTING) - I reviewed patient records, labs, notes, testing and imaging myself where available.  Lab Results  Component Value Date   WBC 11.5 (H) 03/08/2017   HGB 13.7 03/08/2017   HCT 41.4 03/08/2017   MCV 91.0 03/08/2017   PLT 330.0 03/08/2017      Component Value Date/Time   NA 139 08/16/2017 1423   K 3.8 08/16/2017 1423   CL 103 08/16/2017 1423   CO2 30 08/16/2017 1423   GLUCOSE 99 08/16/2017 1423   BUN 11 08/16/2017 1423   CREATININE 0.97 08/16/2017 1423   CALCIUM 9.4 08/16/2017 1423   PROT 7.5 03/08/2017 1005   ALBUMIN 4.2 03/08/2017 1005   AST 14 03/08/2017 1005   ALT 11 03/08/2017 1005   ALKPHOS 59 03/08/2017 1005   BILITOT 0.3 03/08/2017 1005   GFRNONAA >60 02/18/2017 1320   GFRAA >60 02/18/2017 1320   Lab Results  Component Value Date   CHOL 165  03/08/2017   HDL 41.50  03/08/2017   LDLCALC 91 03/08/2017   TRIG 162.0 (H) 03/08/2017   CHOLHDL 4 03/08/2017   Lab Results  Component Value Date   HGBA1C 5.7 03/08/2017   Lab Results  Component Value Date   VITAMINB12 269 08/16/2017   Lab Results  Component Value Date   TSH 2.15 08/16/2017   ASSESSMENT AND PLAN 44 y.o. year old female  has a past medical history of Anxiety, Basal cell carcinoma (BCC) of face, Bell's palsy, Bipolar 1 disorder ([place]), Depression, Headache, and History of genital warts. here with:  1.  Chronic migraine headache -MRI of the brain was normal September 2020 -Headaches are much improved with propanolol -Continue propanolol 60 mg twice a day -Continue Imitrex as needed for migraine   2.  Paresthesia left arm and leg -EMG evaluation in December 2020 was normal of the left arm and left leg -MRI lumbar spine in November 2019 was normal -Continued reports of pain, numbness, hot/cold sensation to left mid calf up to mid thigh, when sitting  -Discussed with Dr. Krista Blue, will order MRI of cervical spine and thoracic spine without contrast for a complete evaluation -Follow-up in 4 to 6 months or sooner if needed  3.  Bipolar disorder, anxiety -She remains on Lamictal, gabapentin (which has been helpful for nerve pain), and Abilify if needed   I spent 25 minutes with the patient. 50% of this time was spent discussing her plan of care.   Butler Denmark, AGNP-C, DNP 03/11/2019, 8:51 AM [facility] [address], [place], [place] [address] [phone]

## 2019-03-11 NOTE — Progress Notes (Signed)
I have reviewed and agreed above plan. 

## 2019-03-19 ENCOUNTER — Telehealth: Payer: Self-pay | Admitting: Neurology

## 2019-03-19 NOTE — Telephone Encounter (Signed)
MRIs have been approved through 09/14/2019, authorization [id number].

## 2019-03-19 NOTE — Telephone Encounter (Signed)
I was unable to get the MRI/'s approved on my level I called [employer] and spoke to a nurse she is unable to get them approved on her level.   There is an option to do a peer to peer the phone number is [phone]. The member ID is [id number] & DOB [birth year]. It does not need to be schedule you just call and can speak with a [employer] MD. The case does close on 03/21/19

## 2019-03-19 NOTE — Telephone Encounter (Signed)
I left a voicemail for patient to call back about scheduling mri .

## 2019-03-19 NOTE — Telephone Encounter (Signed)
Noted, thank you

## 2019-03-24 NOTE — Telephone Encounter (Signed)
Pt has called Raquel Sarna back re: her MRI, please call.

## 2019-03-24 NOTE — Telephone Encounter (Signed)
Spoke to the patient she is scheduled at [facility] for 03/25/19.

## 2019-03-25 ENCOUNTER — Other Ambulatory Visit: Payer: Self-pay

## 2019-03-25 ENCOUNTER — Ambulatory Visit: Payer: BC Managed Care – PPO

## 2019-03-25 DIAGNOSIS — R202 Paresthesia of skin: Secondary | ICD-10-CM | POA: Diagnosis not present

## 2019-03-31 ENCOUNTER — Telehealth: Payer: Self-pay

## 2019-03-31 NOTE — Telephone Encounter (Signed)
I called the patient, reviewed MRI scans as reported. Doesn't want a referral for another PCP. She will mention to PCP incidental renal cyst, she would like referral to urology, will discuss with PCP.

## 2019-03-31 NOTE — Telephone Encounter (Signed)
Called and spoke with the pt directly per NP Grandville Silos request.   Relayed following information: "Please call patient. MRI of thoracic spine was normal, incidental finding of left renal cystic structure measuring 4.9 cm, has enlarged compared to CT abdomen in October 2018. Please have her mention this to her PCP. MRI of cervical spine was normal.   MRI cervical spine  - Please disregard initial report. The corrected findings are as follows: This is a normal study. No disc bulging, spinal stenosis or foraminal narrowing noted.   IMPRESSION:  MRI thoracic spine (without) demonstrating:  - Normal thoracic spine. No intrinsic or compressive spinal cord lesions. No spinal stenosis or foraminal narrowing.  - Incidental finding of left renal cystic structure measuring 4.9cm and projecting inferiorly; this has enlarged compared to CT abdomen on 11/23/16. Correlate clinically. " NP SS   Pt had some questions and concerns.  Pt stipulated that her Mychart shows a herniated disc. She also stipulates that she does not have a good relationship with her PCP--she would prefer that the NP Butler Denmark to refer her to someone to look at the renal growth.

## 2019-04-23 ENCOUNTER — Other Ambulatory Visit: Payer: Self-pay

## 2019-04-23 ENCOUNTER — Encounter: Payer: Self-pay | Admitting: Adult Health

## 2019-04-23 ENCOUNTER — Ambulatory Visit (INDEPENDENT_AMBULATORY_CARE_PROVIDER_SITE_OTHER): Payer: BC Managed Care – PPO | Admitting: Adult Health

## 2019-04-23 DIAGNOSIS — F331 Major depressive disorder, recurrent, moderate: Secondary | ICD-10-CM

## 2019-04-23 DIAGNOSIS — F319 Bipolar disorder, unspecified: Secondary | ICD-10-CM

## 2019-04-23 DIAGNOSIS — F411 Generalized anxiety disorder: Secondary | ICD-10-CM | POA: Diagnosis not present

## 2019-04-23 DIAGNOSIS — G4700 Insomnia, unspecified: Secondary | ICD-10-CM | POA: Diagnosis not present

## 2019-04-23 MED ORDER — LAMOTRIGINE 150 MG PO TABS: ORAL_TABLET | ORAL | 5 refills | Status: DC

## 2019-04-23 MED ORDER — ALPRAZOLAM 1 MG PO TABS: ORAL_TABLET | ORAL | 2 refills | Status: DC

## 2019-04-23 MED ORDER — GABAPENTIN 100 MG PO CAPS: ORAL_CAPSULE | ORAL | 5 refills | Status: DC

## 2019-04-23 MED ORDER — ZOLPIDEM TARTRATE ER 12.5 MG PO TBCR: 12.5000 mg | EXTENDED_RELEASE_TABLET | Freq: Every day | ORAL | 2 refills | Status: DC

## 2019-04-23 NOTE — Progress Notes (Signed)
[name] [id number] [birth year] 44 y.o.  Subjective:   Patient ID:  Brittany Davila is a 44 y.o. (DOB [birth year]) female.  Chief Complaint: No chief complaint on file.   HPI Brittany Davila presents to the office today for follow-up of GAD, MDD, insomnia, and BPD.   Describes mood today as "ok". Pleasant. Mood symptoms - reports some depression and anxiety. Depression is better than it "used to be". Feels more "irritable than anything". Feels like Gabapentin has been helpful. Would like to increase dose. Has not started Abilify. Stating "I'm doing ok for now". Increased stress with work and classes. She and daughter continue to have "issues". Husband out of town this week - "fishing trip". Continues to have nerve pain. Stable interest and motivation. Taking medications as prescribed.  Energy levels stable. Active, does not have a regular exercise routine.  Enjoys some usual interests and activities. Married. Lives with husband and 86 year old daughter. Spending time with family. Appetite adequate. Weight gain - 205. Sleeps well most nights. Averages 5 to 6  hours. Uses Ambien 12.5mg  some nights.  Focus and concentration stable. Completing tasks. Managing aspects of household. Work and school going well. Denies SI or HI. Denies AH or VH.   PHQ2-9     Office Visit from 11/19/2017 in [place] at [facility] Visit from 03/08/2017 in [place] at [facility] Visit from 12/25/2016 in [place] at [facility] Total Score  0  0  0       Review of Systems:  Review of Systems  Musculoskeletal: Negative for gait problem.  Neurological: Negative for tremors.  Psychiatric/Behavioral:       Please refer to HPI    Medications: I have reviewed the patient's current medications.  Current Outpatient Medications  Medication Sig Dispense Refill  . ALPRAZolam (XANAX) 1 MG tablet Take 1/2 tablet twice daily. 30 tablet 2  . ARIPiprazole (ABILIFY) 2 MG  tablet TAKE 1 TABLET BY MOUTH EVERY DAY (Patient not taking: Reported on 03/11/2019) 90 tablet 1  . gabapentin (NEURONTIN) 100 MG capsule Take 1 capsule (100 mg total) by mouth 3 (three) times daily. 90 capsule 2  . lamoTRIgine (LAMICTAL) 150 MG tablet Take one tablet daily. 30 tablet 5  . propranolol (INDERAL) 60 MG tablet Take 1 tablet (60 mg total) by mouth 2 (two) times daily. 60 tablet 11  . SUMAtriptan (IMITREX) 50 MG tablet Take 1 tablet (50 mg total) by mouth every 2 (two) hours as needed for migraine. May repeat in 2 hours if headache persists or recurs. 10 tablet 11  . zolpidem (AMBIEN CR) 12.5 MG CR tablet Take 1 tablet (12.5 mg total) by mouth at bedtime. 30 tablet 2   No current facility-administered medications for this visit.    Medication Side Effects: None  Allergies:  Allergies  Allergen Reactions  . Pollen Extract Other (See Comments)    Past Medical History:  Diagnosis Date  . Anxiety   . Basal cell carcinoma (BCC) of face   . Bell's palsy    H/O  . Bipolar 1 disorder ([place])   . Depression   . Headache    MIGRAINES  . History of genital warts     Family History  Problem Relation Age of Onset  . Diabetes Maternal Grandfather   . Breast cancer Paternal Grandmother   . Colon cancer Paternal Grandfather   . Pancreatic cancer Paternal Grandfather   . Lung cancer Maternal Aunt  Social History   Socioeconomic History  . Marital status: Married    Spouse name: Not on file  . Number of children: Not on file  . Years of education: Not on file  . Highest education level: Not on file  Occupational History  . Not on file  Tobacco Use  . Smoking status: Never Smoker  . Smokeless tobacco: Never Used  Substance and Sexual Activity  . Alcohol use: Yes    Alcohol/week: 0.0 standard drinks    Comment: occasional  . Drug use: No  . Sexual activity: Never  Other Topics Concern  . Not on file  Social History Narrative  . Not on file   Social Determinants  of Health   Financial Resource Strain:   . Difficulty of Paying Living Expenses:   Food Insecurity:   . Worried About Charity fundraiser in the Last Year:   . Arboriculturist in the Last Year:   Transportation Needs:   . Film/video editor (Medical):   Marland Kitchen Lack of Transportation (Non-Medical):   Physical Activity:   . Days of Exercise per Week:   . Minutes of Exercise per Session:   Stress:   . Feeling of Stress :   Social Connections:   . Frequency of Communication with Friends and Family:   . Frequency of Social Gatherings with Friends and Family:   . Attends Religious Services:   . Active Member of Clubs or Organizations:   . Attends Archivist Meetings:   Marland Kitchen Marital Status:   Intimate Partner Violence:   . Fear of Current or Ex-Partner:   . Emotionally Abused:   Marland Kitchen Physically Abused:   . Sexually Abused:     Past Medical History, Surgical history, Social history, and Family history were reviewed and updated as appropriate.   Please see review of systems for further details on the patient's review from today.   Objective:   Physical Exam:  There were no vitals taken for this visit.  Physical Exam Constitutional:      General: She is not in acute distress. Musculoskeletal:        General: No deformity.  Neurological:     Mental Status: She is alert and oriented to person, place, and time.     Coordination: Coordination normal.  Psychiatric:        Attention and Perception: Attention and perception normal. She does not perceive auditory or visual hallucinations.        Mood and Affect: Mood normal. Mood is not anxious or depressed. Affect is not labile, blunt, angry or inappropriate.        Speech: Speech normal.        Behavior: Behavior normal.        Thought Content: Thought content normal. Thought content is not paranoid or delusional. Thought content does not include homicidal or suicidal ideation. Thought content does not include homicidal or  suicidal plan.        Cognition and Memory: Cognition and memory normal.        Judgment: Judgment normal.     Comments: Insight intact     Lab Review:     Component Value Date/Time   NA 139 08/16/2017 1423   K 3.8 08/16/2017 1423   CL 103 08/16/2017 1423   CO2 30 08/16/2017 1423   GLUCOSE 99 08/16/2017 1423   BUN 11 08/16/2017 1423   CREATININE 0.97 08/16/2017 1423   CALCIUM 9.4 08/16/2017 1423   PROT  7.5 03/08/2017 1005   ALBUMIN 4.2 03/08/2017 1005   AST 14 03/08/2017 1005   ALT 11 03/08/2017 1005   ALKPHOS 59 03/08/2017 1005   BILITOT 0.3 03/08/2017 1005   GFRNONAA >60 02/18/2017 1320   GFRAA >60 02/18/2017 1320       Component Value Date/Time   WBC 11.5 (H) 03/08/2017 1005   RBC 4.55 03/08/2017 1005   HGB 13.7 03/08/2017 1005   HCT 41.4 03/08/2017 1005   PLT 330.0 03/08/2017 1005   MCV 91.0 03/08/2017 1005   MCH 30.6 02/18/2017 1320   MCHC 33.0 03/08/2017 1005   RDW 13.2 03/08/2017 1005    No results found for: POCLITH, LITHIUM   No results found for: PHENYTOIN, PHENOBARB, VALPROATE, CBMZ   .res Assessment: Plan:    Plan:  1. Ambien CR 12.5mg  at hs 2. Xanax 1mg  - 1/2 tab BID 3. Lamictal 150mg  at hs 4. Gabapentin 100mg  to 200mg  TID 5. Add Abilify 2mg  daily - hold for now  RTC 3 months  Patient advised to contact office with any questions, adverse effects, or acute worsening in signs and symptoms.  Discussed potential benefits, risk, and side effects of benzodiazepines to include potential risk of tolerance and dependence, as well as possible drowsiness.  Advised patient not to drive if experiencing drowsiness and to take lowest possible effective dose to minimize risk of dependence and tolerance.  Discussed potential metabolic side effects associated with atypical antipsychotics, as well as potential risk for movement side effects. Advised pt to contact office if movement side effects occur.    There are no diagnoses linked to this encounter.    Please see After Visit Summary for patient specific instructions.  No future appointments.  No orders of the defined types were placed in this encounter.   -------------------------------

## 2019-05-20 ENCOUNTER — Telehealth: Payer: Self-pay | Admitting: Adult Health

## 2019-05-20 NOTE — Telephone Encounter (Signed)
Pt's [employer] is requesting a written statement about Brittany Davila's diagnosis. The letter needs to state the day of her diagnosis and how it may affect her with schooling and classes. Also if there are any accommodations that she may need.

## 2019-05-21 NOTE — Telephone Encounter (Signed)
Can we get more specifics from pt. I don't know her date of diagnosis.

## 2019-05-21 NOTE — Telephone Encounter (Signed)
Noted  

## 2019-05-29 ENCOUNTER — Other Ambulatory Visit: Payer: Self-pay | Admitting: Orthopaedic Surgery

## 2019-05-29 DIAGNOSIS — M25562 Pain in left knee: Secondary | ICD-10-CM

## 2019-05-29 DIAGNOSIS — G8929 Other chronic pain: Secondary | ICD-10-CM

## 2019-05-29 DIAGNOSIS — Z0289 Encounter for other administrative examinations: Secondary | ICD-10-CM

## 2019-07-25 ENCOUNTER — Other Ambulatory Visit: Payer: Self-pay | Admitting: Adult Health

## 2019-07-25 DIAGNOSIS — G4700 Insomnia, unspecified: Secondary | ICD-10-CM

## 2019-07-25 NOTE — Telephone Encounter (Signed)
Next apt 09/23 

## 2019-08-08 ENCOUNTER — Telehealth: Payer: Self-pay | Admitting: Internal Medicine

## 2019-08-08 ENCOUNTER — Other Ambulatory Visit: Payer: Self-pay | Admitting: Adult Health

## 2019-08-08 DIAGNOSIS — F319 Bipolar disorder, unspecified: Secondary | ICD-10-CM

## 2019-08-08 NOTE — Telephone Encounter (Signed)
Please schedule TOC with Dr Einar Pheasant, thanks.

## 2019-08-08 NOTE — Telephone Encounter (Signed)
OK with me.

## 2019-08-08 NOTE — Telephone Encounter (Signed)
Ok with me 

## 2019-08-08 NOTE — Telephone Encounter (Signed)
Patient sent mychart message requesting to transfer from current PCP to [facility]. Please advise if TOC is approved.

## 2019-08-11 NOTE — Telephone Encounter (Signed)
Patient scheduled for TOC.

## 2019-08-31 ENCOUNTER — Other Ambulatory Visit: Payer: Self-pay | Admitting: Adult Health

## 2019-09-09 ENCOUNTER — Other Ambulatory Visit: Payer: Self-pay | Admitting: Neurology

## 2019-09-30 ENCOUNTER — Telehealth: Payer: BC Managed Care – PPO | Admitting: Internal Medicine

## 2019-10-13 ENCOUNTER — Encounter: Payer: Self-pay | Admitting: Family Medicine

## 2019-10-13 ENCOUNTER — Ambulatory Visit: Payer: BC Managed Care – PPO | Admitting: Family Medicine

## 2019-10-13 ENCOUNTER — Other Ambulatory Visit: Payer: Self-pay

## 2019-10-13 VITALS — BP 132/86 | HR 72 | Temp 98.2°F | Ht 67.75 in | Wt 210.5 lb

## 2019-10-13 DIAGNOSIS — Z1231 Encounter for screening mammogram for malignant neoplasm of breast: Secondary | ICD-10-CM | POA: Diagnosis not present

## 2019-10-13 DIAGNOSIS — R7989 Other specified abnormal findings of blood chemistry: Secondary | ICD-10-CM | POA: Diagnosis not present

## 2019-10-13 DIAGNOSIS — Z13220 Encounter for screening for lipoid disorders: Secondary | ICD-10-CM

## 2019-10-13 DIAGNOSIS — Z6831 Body mass index (BMI) 31.0-31.9, adult: Secondary | ICD-10-CM

## 2019-10-13 DIAGNOSIS — Z0000 Encounter for general adult medical examination without abnormal findings: Secondary | ICD-10-CM | POA: Diagnosis not present

## 2019-10-13 DIAGNOSIS — Z1159 Encounter for screening for other viral diseases: Secondary | ICD-10-CM

## 2019-10-13 DIAGNOSIS — N63 Unspecified lump in unspecified breast: Secondary | ICD-10-CM | POA: Diagnosis not present

## 2019-10-13 DIAGNOSIS — R7303 Prediabetes: Secondary | ICD-10-CM | POA: Insufficient documentation

## 2019-10-13 DIAGNOSIS — R202 Paresthesia of skin: Secondary | ICD-10-CM

## 2019-10-13 DIAGNOSIS — E6609 Other obesity due to excess calories: Secondary | ICD-10-CM

## 2019-10-13 LAB — COMPREHENSIVE METABOLIC PANEL
ALT: 14 U/L (ref 0–35)
AST: 13 U/L (ref 0–37)
Albumin: 4.6 g/dL (ref 3.5–5.2)
Alkaline Phosphatase: 65 U/L (ref 39–117)
BUN: 11 mg/dL (ref 6–23)
CO2: 27 mEq/L (ref 19–32)
Calcium: 9.4 mg/dL (ref 8.4–10.5)
Chloride: 105 mEq/L (ref 96–112)
Creatinine, Ser: 0.99 mg/dL (ref 0.40–1.20)
GFR: 61.24 mL/min (ref >60.00)
Glucose, Bld: 75 mg/dL (ref 70–99)
Potassium: 3.8 mEq/L (ref 3.5–5.1)
Sodium: 141 mEq/L (ref 135–145)
Total Bilirubin: 0.5 mg/dL (ref 0.2–1.2)
Total Protein: 6.9 g/dL (ref 6.0–8.3)

## 2019-10-13 LAB — CBC
HCT: 38.5 % (ref 36.0–46.0)
Hemoglobin: 12.9 g/dL (ref 12.0–15.0)
MCHC: 33.5 g/dL (ref 30.0–36.0)
MCV: 91.5 fl (ref 78.0–100.0)
Platelets: 307 10*3/uL (ref 150.0–400.0)
RBC: 4.21 Mil/uL (ref 3.87–5.11)
RDW: 13 % (ref 11.5–15.5)
WBC: 6.8 10*3/uL (ref 4.0–10.5)

## 2019-10-13 LAB — LIPID PANEL
Cholesterol: 171 mg/dL (ref 0–200)
HDL: 51.4 mg/dL (ref >39.00)
LDL Cholesterol: 101 mg/dL — ABNORMAL HIGH (ref 0–99)
NonHDL: 119.28
Total CHOL/HDL Ratio: 3
Triglycerides: 93 mg/dL (ref 0.0–149.0)
VLDL: 18.6 mg/dL (ref 0.0–40.0)

## 2019-10-13 LAB — HEMOGLOBIN A1C: Hgb A1c MFr Bld: 5.6 % (ref 4.6–6.5)

## 2019-10-13 LAB — VITAMIN D 25 HYDROXY (VIT D DEFICIENCY, FRACTURES): VITD: 51.13 ng/mL (ref 30.00–100.00)

## 2019-10-13 LAB — VITAMIN B12: Vitamin B-12: 285 pg/mL (ref 211–911)

## 2019-10-13 NOTE — Progress Notes (Signed)
Annual Exam   Chief Complaint:  Chief Complaint  Patient presents with  . Establish Care  . Annual Exam    declined flu vaccine at this time    History of Present Illness:  [name] is a 44 y.o. No obstetric history on file. who LMP was Patient's last menstrual period was 10/10/2019 (exact date)., presents today for her annual examination.     Nutrition Diet: not great Exercise: not currenty She does not get adequate calcium and Vitamin D in her diet.   Social History   Tobacco Use  Smoking Status Never Smoker  Smokeless Tobacco Never Used   Social History   Substance and Sexual Activity  Alcohol Use Yes  . Alcohol/week: 0.0 standard drinks   Comment: 1-2 times a month   Social History   Substance and Sexual Activity  Drug Use No    Safety The patient wears seatbelts: yes.     The patient feels safe at home and in their relationships: yes.  General Health Dentist in the last year: No Eye doctor: yes  Menstrual Her menses are regular every month. Occasionally with 3-7 days.   GYN She is single partner, contraception - tubal ligation.    Cervical Cancer Screening:   Last Pap:   Can see ASCUS hpv negative documented in 2019 and 2018. Will request records to get most up to date results.    Breast Cancer Screening There is  FH of breast cancer. There is no FH of ovarian cancer. BRCA screening Not Indicated.  Discussed that for average risk women between age 33-49 screening may reduce the risk of breast cancer death, however, at a lower rate than those over age 24. And that the the false-positive rates resulting in unnecessary biopsies with more screening is higher. The balance of benefits vs harms likely improves as you progress through your 40s. The patient does want a mammogram this year.    Weight Wt Readings from Last 3 Encounters:  10/13/19 210 lb 8 oz (95.5 kg)  03/11/19 204 lb 6.4 oz (92.7 kg)  11/06/18 198 lb (89.8 kg)   Patient has high  BMI  BMI Readings from Last 1 Encounters:  10/13/19 32.24 kg/m     Chronic disease screening Blood pressure monitoring:  BP Readings from Last 3 Encounters:  10/13/19 132/86  03/11/19 107/68  11/06/18 106/72    Lipid Monitoring: Indication for screening: age >24, obesity, diabetes, family hx, CV risk factors.  Lipid screening: Yes  Lab Results  Component Value Date   CHOL 165 03/08/2017   HDL 41.50 03/08/2017   LDLCALC 91 03/08/2017   TRIG 162.0 (H) 03/08/2017   CHOLHDL 4 03/08/2017     Diabetes Screening: age >39, overweight, family hx, PCOS, hx of gestational diabetes, at risk ethnicity Diabetes Screening screening: Yes  Lab Results  Component Value Date   HGBA1C 5.7 03/08/2017     Past Medical History:  Diagnosis Date  . Anxiety   . Basal cell carcinoma (BCC) of face   . Bell's palsy    H/O  . Bipolar 1 disorder ([place])   . Depression   . Headache    MIGRAINES  . History of genital warts     Past Surgical History:  Procedure Laterality Date  . BREAST BIOPSY Right 07/03/2018   [place] bx           heart marker              path pending  .  BREAST CYST ASPIRATION Left 2014   by Dr. Tamala Julian  . LAPAROSCOPIC TUBAL LIGATION Bilateral 11/10/2016   Procedure: LAPAROSCOPIC TUBAL LIGATION;  Surgeon: Benjaman Kindler, MD;  Location: [facility] ORS;  Service: Gynecology;  Laterality: Bilateral;  . MOHS SURGERY    . TONSILLECTOMY AND ADENOIDECTOMY    . TUBAL LIGATION    . tubes in right ear      Prior to Admission medications   Medication Sig Start Date End Date Taking? Authorizing Provider  ALPRAZolam Duanne Moron) 1 MG tablet Take 1/2 tablet twice daily. Patient taking differently: Take 0.5 mg by mouth 2 (two) times daily as needed.  04/23/19  Yes Mozingo, Berdie Ogren, NP  gabapentin (NEURONTIN) 100 MG capsule Take two capsules three times daily. Patient taking differently: Take 100 mg by mouth. Take one capsule three times daily. 04/23/19  Yes Mozingo, Berdie Ogren, NP   lamoTRIgine (LAMICTAL) 150 MG tablet Take one tablet daily. 04/23/19  Yes Mozingo, Berdie Ogren, NP  propranolol (INDERAL) 60 MG tablet Take 1 tablet (60 mg total) by mouth 2 (two) times daily. 11/06/18  Yes Suzzanne Cloud, NP  SUMAtriptan (IMITREX) 50 MG tablet TAKE 1 TABLET BY MOUTH AT ONSET OF HEADACHE, MAY REPEAT IN 2 HOURS IF HEADACHE PERSISTS OR RECURS. 09/09/19  Yes Marcial Pacas, MD  zolpidem (AMBIEN CR) 12.5 MG CR tablet Take 1 tablet (12.5 mg total) by mouth at bedtime. 04/23/19  Yes Mozingo, Berdie Ogren, NP  ARIPiprazole (ABILIFY) 2 MG tablet TAKE 1 TABLET BY MOUTH EVERY DAY Patient not taking: Reported on 10/13/2019 08/08/19   Mozingo, Berdie Ogren, NP    Allergies  Allergen Reactions  . Pollen Extract Other (See Comments)    Gynecologic History: Patient's last menstrual period was 10/10/2019 (exact date).  Obstetric History: No obstetric history on file.  Social History   Socioeconomic History  . Marital status: Married    Spouse name: Marcello Moores  . Number of children: 1  . Years of education: [employer]  . Highest education level: Not on file  Occupational History  . Not on file  Tobacco Use  . Smoking status: Never Smoker  . Smokeless tobacco: Never Used  Vaping Use  . Vaping Use: Never used  Substance and Sexual Activity  . Alcohol use: Yes    Alcohol/week: 0.0 standard drinks    Comment: 1-2 times a month  . Drug use: No  . Sexual activity: Yes    Birth control/protection: Surgical  Other Topics Concern  . 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 Difficulty of Paying Living Expenses: Not on file  Food Insecurity:   . Worried About Charity fundraiser in the Last Year: Not on file  . Ran Out of Food in the Last Year: Not on file  Transportation Needs:   . Lack of Transportation (Medical): Not on file  . Lack of Transportation (Non-Medical):  Not on file  Physical Activity:   . Days of Exercise per Week: Not on file  . Minutes of Exercise per Session: Not on file  Stress:   . Feeling of Stress : Not on file  Social Connections:   . Frequency of Communication with Friends and Family: Not on file  . Frequency of Social Gatherings with Friends and Family: Not on file  . Attends Religious Services: Not on file  . Active Member of Clubs or Organizations: Not on file  . Attends Archivist Meetings: Not on file  . Marital Status: Not on file  Intimate Partner Violence:   . Fear of Current or Ex-Partner: Not on file  . Emotionally Abused: Not on file  . Physically Abused: Not on file  . Sexually Abused: Not on file    Family History  Problem Relation Age of Onset  . Diabetes Maternal Grandfather   . Breast cancer Paternal Grandmother 25  . Colon cancer Paternal Grandfather 51  . Pancreatic cancer Paternal Grandfather   . Depression Mother   . Depression Father   . Lung cancer Maternal Aunt     Review of Systems  Constitutional: Negative for chills and fever.  HENT: Negative for congestion and sore throat.   Eyes: Negative for blurred vision and double vision.  Respiratory: Negative for shortness of breath.   Cardiovascular: Negative for chest pain.  Gastrointestinal: Negative for heartburn, nausea and vomiting.  Genitourinary: Negative.   Musculoskeletal: Negative.  Negative for myalgias.  Skin: Negative for rash.  Neurological: Negative for dizziness and headaches.  Endo/Heme/Allergies: Does not bruise/bleed easily.  Psychiatric/Behavioral: Negative for depression. The  patient is not nervous/anxious.      Physical Exam BP 132/86   Pulse 72   Temp 98.2 F (36.8 C) (Temporal)   Ht 5' 7.75" (1.721 m)   Wt 210 lb 8 oz (95.5 kg)   LMP 10/10/2019 (Exact Date)   SpO2 96%   BMI 32.24 kg/m    BP Readings from Last 3 Encounters:  10/13/19 132/86  03/11/19 107/68  11/06/18 106/72      Physical Exam Constitutional:      General: She is not in acute distress.    Appearance: She is well-developed. She is not diaphoretic.  HENT:     Head: Normocephalic and atraumatic.     Right Ear: External ear normal.     Left Ear: External ear normal.     Nose: Nose normal.  Eyes:     General: No scleral icterus.    Conjunctiva/sclera: Conjunctivae normal.  Cardiovascular:     Rate and Rhythm: Normal rate and regular rhythm.     Heart sounds: No murmur heard.   Pulmonary:     Effort: Pulmonary effort is normal. No respiratory distress.     Breath sounds: Normal breath sounds. No wheezing.  Abdominal:     General: Bowel sounds are normal. There is no distension.     Palpations: Abdomen is soft. There is no mass.     Tenderness: There is no abdominal tenderness. There is no guarding or rebound.  Musculoskeletal:        General: Normal range of motion.     Cervical back: Neck supple.     Comments: Left ankle swelling  Lymphadenopathy:     Cervical: No cervical adenopathy.  Skin:    General: Skin is warm and dry.     Capillary Refill: Capillary refill takes less than 2 seconds.  Neurological:  Mental Status: She is alert and oriented to person, place, and time.     Deep Tendon Reflexes: Reflexes normal.  Psychiatric:        Behavior: Behavior normal.      Results:  PHQ-9:    Office Visit from 10/13/2019 in [place] at [facility] Total Score 11        Assessment: 44 y.o. No obstetric history on file. female here for routine annual physical examination.  Plan: Problem List Items Addressed This Visit      Other    Obesity   Paresthesia - Primary    Taking gabapentin. Helps some. No known diagnosis. Following with neurology      Relevant Orders   Vitamin B12   Prediabetes   Relevant Orders   Hemoglobin A1c    Other Visit Diagnoses    Encounter for screening mammogram for malignant neoplasm of breast       Relevant Orders   MM Digital Diagnostic Bilat   Breast mass seen on mammogram       Relevant Orders   MM Digital Diagnostic Bilat   Need for hepatitis C screening test       Relevant Orders   Hepatitis C antibody   Low vitamin D level       Relevant Orders   Vitamin D, 25-hydroxy   Annual physical exam       Relevant Orders   Comprehensive metabolic panel   CBC   Screening for hyperlipidemia       Relevant Orders   Lipid panel      Screening: -- Blood pressure screen normal -- cholesterol screening: will obtain -- Weight screening: obese: discussed management options, including lifestyle, dietary, and exercise. -- Diabetes Screening: will obtain -- Nutrition: Encouraged healthy diet  The 10-year ASCVD risk score Mikey Bussing DC Jr., et al., 2013) is: 0.8%   Values used to calculate the score:     Age: 25 years     Sex: Female     Is Non-Hispanic African American: No     Diabetic: No     Tobacco smoker: No     Systolic Blood Pressure: 568 mmHg     Is BP treated: No     HDL Cholesterol: 41.5 mg/dL     Total Cholesterol: 165 mg/dL  -- Statin therapy for Age 60-75 with CVD risk >7.5%  Psych -- Depression screening (PHQ-9):    Office Visit from 10/13/2019 in [place] at [place]  PHQ-9 Total Score 11       Safety -- tobacco screening: not using -- alcohol screening:  low-risk usage. -- no evidence of domestic violence or intimate partner violence.   Cancer Screening -- pap smear not collected per ASCCP guidelines -- family history of breast cancer screening: done. not at high risk. -- Mammogram - ordered -- Colon cancer (age 16+)-- not  indicated  Immunizations Immunization History  Administered Date(s) Administered  . Influenza,inj,Quad PF,6+ Mos 10/31/2016, 11/06/2017  . Influenza,inj,quad, With Preservative 10/25/2015    -- flu vaccine pt declined -- TDAP q10 years up to date -- Covid-19 Vaccine pt declined   Encouraged healthy diet and exercise. Encouraged regular vision and dental care.   Lesleigh Noe, MD

## 2019-10-13 NOTE — Patient Instructions (Addendum)
Work on [employer] healthy   I will get your PAP results and let you know if you are due  Return if you want to discuss the Covid vaccine in more detail if needed

## 2019-10-13 NOTE — Assessment & Plan Note (Signed)
Taking gabapentin. Helps some. No known diagnosis. Following with neurology

## 2019-10-14 LAB — HEPATITIS C ANTIBODY
Hepatitis C Ab: NONREACTIVE
SIGNAL TO CUT-OFF: 0.01 (ref <1.00)

## 2019-10-16 ENCOUNTER — Telehealth: Payer: Self-pay | Admitting: *Deleted

## 2019-10-16 DIAGNOSIS — N63 Unspecified lump in unspecified breast: Secondary | ICD-10-CM

## 2019-10-16 NOTE — Telephone Encounter (Signed)
Noted  

## 2019-10-16 NOTE — Telephone Encounter (Signed)
Order placed on 10/13/2019 for Diagnostic Mammo is incorrect  This should be ordered as MM DIAG BREAST TOMO BILATERAL  Due to the patient having her Diagnostic Mammogram at [facility], they require two [place] orders, One for the affected breast and one for the other breast in case they want a comparison [place]. Please specify in the order the clock location of the breast issue.   Please sign the pended orders - please save to your favorites :)  [id number]   MM DIAG BREAST TOMO BILATERAL [id number]   US BREAST LTD UNI LEFT INC AXILLA [id number]   US BREAST LTD UNI RIGHT INC AXILLA

## 2019-10-16 NOTE — Telephone Encounter (Signed)
Done. Appreciate the pended orders

## 2019-10-23 ENCOUNTER — Ambulatory Visit (INDEPENDENT_AMBULATORY_CARE_PROVIDER_SITE_OTHER): Payer: BC Managed Care – PPO | Admitting: Adult Health

## 2019-10-23 ENCOUNTER — Other Ambulatory Visit: Payer: Self-pay

## 2019-10-23 ENCOUNTER — Encounter: Payer: Self-pay | Admitting: Adult Health

## 2019-10-23 DIAGNOSIS — G4700 Insomnia, unspecified: Secondary | ICD-10-CM | POA: Diagnosis not present

## 2019-10-23 DIAGNOSIS — F411 Generalized anxiety disorder: Secondary | ICD-10-CM | POA: Diagnosis not present

## 2019-10-23 DIAGNOSIS — F331 Major depressive disorder, recurrent, moderate: Secondary | ICD-10-CM

## 2019-10-23 DIAGNOSIS — F319 Bipolar disorder, unspecified: Secondary | ICD-10-CM | POA: Diagnosis not present

## 2019-10-23 DIAGNOSIS — H903 Sensorineural hearing loss, bilateral: Secondary | ICD-10-CM | POA: Insufficient documentation

## 2019-10-23 DIAGNOSIS — H9313 Tinnitus, bilateral: Secondary | ICD-10-CM | POA: Insufficient documentation

## 2019-10-23 MED ORDER — ALPRAZOLAM 1 MG PO TABS: ORAL_TABLET | ORAL | 2 refills | Status: DC

## 2019-10-23 MED ORDER — LAMOTRIGINE 150 MG PO TABS: ORAL_TABLET | ORAL | 5 refills | Status: DC

## 2019-10-23 MED ORDER — GABAPENTIN 100 MG PO CAPS: ORAL_CAPSULE | ORAL | 5 refills | Status: DC

## 2019-10-23 MED ORDER — ZOLPIDEM TARTRATE ER 12.5 MG PO TBCR: 12.5000 mg | EXTENDED_RELEASE_TABLET | Freq: Every day | ORAL | 2 refills | Status: DC

## 2019-10-23 NOTE — Progress Notes (Signed)
Brittany Davila [id number] [birth year] 44 y.o.  Subjective:   Patient ID:  Brittany Davila is a 44 y.o. (DOB 08-02-1975) female.  Chief Complaint: No chief complaint on file.   HPI [place] presents to the office today for follow-up of GAD, MDD, insomnia, and BPD.   Describes mood today as "ok". Pleasant. Mood symptoms - reports decreased depression and anxiety. Feels more irritable overall. Thinking about starting Abilify - has a prescription at home. Has stopped taking classes for now. Increased migraines. Daughter recently diagnosed with ADHD. Family members with Covid. She and husband doing well. Gabapentin has helped with nerve pain. Stable interest and motivation. Taking medications as prescribed.  Energy levels stable. Active, does not have a regular exercise routine.  Enjoys some usual interests and activities. Married. Lives with husband and 42 year old daughter. Daughter has returned to school - playing volleyball. Spending time with family. Appetite adequate. Weight gain - 208 pounds.. Sleeps well most nights. Averages 5 to 6 hours with Ambien 12.5mg  some nights.  Focus and concentration stable. Completing tasks. Managing aspects of household. Workl going well. Denies SI or HI. Denies AH or VH.   GAD-7     Office Visit from 10/13/2019 in [place] at [facility]  Total GAD-7 Score 7    PHQ2-9     Office Visit from 10/13/2019 in [place] at [facility] Visit from 11/19/2017 in [place] at [facility] Visit from 03/08/2017 in [place] at [facility] Visit from 12/25/2016 in [place] at [facility]  PHQ-2 Total Score 2 0 0 0  PHQ-9 Total Score 11 -- -- --       Review of Systems:  Review of Systems  Genitourinary: Negative for vaginal bleeding.  Musculoskeletal: Negative for gait problem.  Neurological: Negative for tremors.  Psychiatric/Behavioral:       Please refer to HPI    Medications: I have  reviewed the patient's current medications.  Current Outpatient Medications  Medication Sig Dispense Refill  . ALPRAZolam (XANAX) 1 MG tablet Take 1/2 tablet twice daily. 30 tablet 2  . ARIPiprazole (ABILIFY) 2 MG tablet TAKE 1 TABLET BY MOUTH EVERY DAY (Patient not taking: Reported on 10/13/2019) 90 tablet 1  . gabapentin (NEURONTIN) 100 MG capsule Take two capsules three times daily. 180 capsule 5  . lamoTRIgine (LAMICTAL) 150 MG tablet Take one tablet daily. 30 tablet 5  . propranolol (INDERAL) 60 MG tablet Take 1 tablet (60 mg total) by mouth 2 (two) times daily. 60 tablet 11  . SUMAtriptan (IMITREX) 50 MG tablet TAKE 1 TABLET BY MOUTH AT ONSET OF HEADACHE, MAY REPEAT IN 2 HOURS IF HEADACHE PERSISTS OR RECURS. 10 tablet 1  . zolpidem (AMBIEN CR) 12.5 MG CR tablet Take 1 tablet (12.5 mg total) by mouth at bedtime. 30 tablet 2   No current facility-administered medications for this visit.    Medication Side Effects: None  Allergies:  Allergies  Allergen Reactions  . Pollen Extract Other (See Comments)    Past Medical History:  Diagnosis Date  . Anxiety   . Basal cell carcinoma (BCC) of face   . Bell's palsy    H/O  . Bipolar 1 disorder ([place])   . Depression   . Headache    MIGRAINES  . History of genital warts     Family History  Problem Relation Age of Onset  . Diabetes Maternal Grandfather   . Breast cancer Paternal Grandmother 58  .  Colon cancer Paternal Grandfather 61  . Pancreatic cancer Paternal Grandfather   . Depression Mother   . Depression Father   . Lung cancer Maternal Aunt     Social History   Socioeconomic History  . Marital status: Married    Spouse name: Marcello Moores  . Number of children: 1  . Years of education: [employer]  . Highest education level: Not on file  Occupational History  . Not on file  Tobacco Use  . Smoking status: Never Smoker  . Smokeless tobacco: Never Used  Vaping Use  . Vaping Use: Never used  Substance and Sexual Activity   . Alcohol use: Yes    Alcohol/week: 0.0 standard drinks    Comment: 1-2 times a month  . Drug use: No  . Sexual activity: Yes    Birth control/protection: Surgical  Other Topics Concern  . 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 Difficulty of Paying Living Expenses: Not on file  Food Insecurity:   . Worried About Charity fundraiser in the Last Year: Not on file  . Ran Out of Food in the Last Year: Not on file  Transportation Needs:   . Lack of Transportation (Medical): Not on file  . Lack of Transportation (Non-Medical): Not on file  Physical Activity:   . Days of Exercise per Week: Not on file  . Minutes of Exercise per Session: Not on file  Stress:   . Feeling of Stress : Not on file  Social Connections:   . Frequency of Communication with Friends and Family: Not on file  . Frequency of Social Gatherings with Friends and Family: Not on file  . Attends Religious Services: Not on file  . Active Member of Clubs or Organizations: Not on file  . Attends Archivist Meetings: Not on file  . Marital Status: Not on file  Intimate Partner Violence:   . Fear of Current or Ex-Partner: Not on file  . Emotionally Abused: Not on file  . Physically Abused: Not on file  . Sexually Abused: Not on file    Past Medical History, Surgical history, Social history, and Family history were reviewed and updated as appropriate.   Please see review of systems for further details on the patient's review from today.   Objective:   Physical Exam:  LMP  10/10/2019 (Exact Date)   Physical Exam Constitutional:      General: She is not in acute distress. Musculoskeletal:        General: No deformity.  Neurological:     Mental Status: She is alert and oriented to person, place, and time.     Coordination: Coordination normal.  Psychiatric:        Attention and Perception: Attention and perception normal. She does not perceive auditory or visual hallucinations.        Mood and Affect: Mood normal. Mood  is not anxious or depressed. Affect is not labile, blunt, angry or inappropriate.        Speech: Speech normal.        Behavior: Behavior normal.        Thought Content: Thought content normal. Thought content is not paranoid or delusional. Thought content does not include homicidal or suicidal ideation. Thought content does not include homicidal or suicidal plan.        Cognition and Memory: Cognition and memory normal.        Judgment: Judgment normal.     Comments: Insight intact     Lab Review:     Component Value Date/Time   NA 141 10/13/2019 1302   K 3.8 10/13/2019 1302   CL 105 10/13/2019 1302   CO2 27 10/13/2019 1302   GLUCOSE 75 10/13/2019 1302   BUN 11 10/13/2019 1302   CREATININE 0.99 10/13/2019 1302   CALCIUM 9.4 10/13/2019 1302   PROT 6.9 10/13/2019 1302   ALBUMIN 4.6 10/13/2019 1302   AST 13 10/13/2019 1302   ALT 14 10/13/2019 1302   ALKPHOS 65 10/13/2019 1302   BILITOT 0.5 10/13/2019 1302   GFRNONAA >60 02/18/2017 1320   GFRAA >60 02/18/2017 1320       Component Value Date/Time   WBC 6.8 10/13/2019 1302   RBC 4.21 10/13/2019 1302   HGB 12.9 10/13/2019 1302   HCT 38.5 10/13/2019 1302   PLT 307.0 10/13/2019 1302   MCV 91.5 10/13/2019 1302   MCH 30.6 02/18/2017 1320   MCHC 33.5 10/13/2019 1302   RDW 13.0 10/13/2019 1302    No results found for: POCLITH, LITHIUM   No results found for: PHENYTOIN, PHENOBARB, VALPROATE, CBMZ   .res Assessment: Plan:    Plan:  1. Ambien CR 12.5mg  at hs 2. Xanax 1mg   - 1/2 tab BID 3. Lamictal 150mg  at hs 4. Gabapentin 200mg  TID 5. Abilify 2mg  daily   RTC 3 months  Patient advised to contact office with any questions, adverse effects, or acute worsening in signs and symptoms.  Discussed potential benefits, risk, and side effects of benzodiazepines to include potential risk of tolerance and dependence, as well as possible drowsiness.  Advised patient not to drive if experiencing drowsiness and to take lowest possible effective dose to minimize risk of dependence and tolerance.  Discussed potential metabolic side effects associated with atypical antipsychotics, as well as potential risk for movement side effects. Advised pt to contact office if movement side effects occur.    Diagnoses and all orders for this visit:  Insomnia, unspecified type -     zolpidem (AMBIEN CR) 12.5 MG CR tablet; Take 1 tablet (12.5 mg total) by mouth at bedtime.  Generalized anxiety disorder -     gabapentin (NEURONTIN) 100 MG capsule; Take two capsules three times daily. -     ALPRAZolam (XANAX) 1 MG tablet; Take 1/2 tablet twice daily.  Bipolar 1 disorder (HCC) -     lamoTRIgine (LAMICTAL) 150 MG tablet; Take one tablet daily.  Major depressive disorder, recurrent episode, moderate (HCC) -     lamoTRIgine (LAMICTAL) 150 MG tablet; Take one tablet daily.     Please see After Visit Summary for patient specific instructions.  Future Appointments  Date Time Provider [place]  11/04/2019  9:20 AM [facility] MM DIAGNOSTIC [facility]  11/04/2019  9:40 AM [facility] [place] [facility]    No orders of the defined types were placed in this encounter.   -------------------------------

## 2019-11-04 ENCOUNTER — Ambulatory Visit
Admission: RE | Admit: 2019-11-04 | Discharge: 2019-11-04 | Disposition: A | Discharge status: Home / Self Care | Payer: BC Managed Care – PPO | Source: Ambulatory Visit | Attending: Family Medicine | Admitting: Family Medicine

## 2019-11-04 ENCOUNTER — Other Ambulatory Visit: Payer: Self-pay | Admitting: Family Medicine

## 2019-11-04 ENCOUNTER — Other Ambulatory Visit: Payer: Self-pay

## 2019-11-04 DIAGNOSIS — N63 Unspecified lump in unspecified breast: Secondary | ICD-10-CM | POA: Insufficient documentation

## 2019-11-07 ENCOUNTER — Encounter: Payer: Self-pay | Admitting: Family Medicine

## 2019-11-11 ENCOUNTER — Other Ambulatory Visit: Payer: Self-pay | Admitting: Neurology

## 2019-11-16 ENCOUNTER — Other Ambulatory Visit: Payer: Self-pay | Admitting: Neurology

## 2019-12-05 ENCOUNTER — Other Ambulatory Visit: Payer: Self-pay | Admitting: Neurology

## 2019-12-08 ENCOUNTER — Other Ambulatory Visit: Payer: Self-pay | Admitting: Neurology

## 2020-01-09 ENCOUNTER — Other Ambulatory Visit: Payer: Self-pay | Admitting: Neurology

## 2020-01-15 ENCOUNTER — Other Ambulatory Visit: Payer: Self-pay | Admitting: Adult Health

## 2020-01-15 DIAGNOSIS — G4700 Insomnia, unspecified: Secondary | ICD-10-CM

## 2020-02-09 ENCOUNTER — Other Ambulatory Visit: Payer: Self-pay | Admitting: Neurology

## 2020-02-26 ENCOUNTER — Telehealth (INDEPENDENT_AMBULATORY_CARE_PROVIDER_SITE_OTHER): Payer: BC Managed Care – PPO | Admitting: Family Medicine

## 2020-02-26 ENCOUNTER — Encounter: Payer: Self-pay | Admitting: Family Medicine

## 2020-02-26 VITALS — HR 65 | Temp 98.1°F

## 2020-02-26 DIAGNOSIS — U071 COVID-19: Secondary | ICD-10-CM | POA: Diagnosis not present

## 2020-02-26 DIAGNOSIS — J0100 Acute maxillary sinusitis, unspecified: Secondary | ICD-10-CM | POA: Diagnosis not present

## 2020-02-26 DIAGNOSIS — R112 Nausea with vomiting, unspecified: Secondary | ICD-10-CM | POA: Diagnosis not present

## 2020-02-26 MED ORDER — PREDNISONE 20 MG PO TABS: 40.0000 mg | ORAL_TABLET | Freq: Every day | ORAL | 0 refills | Status: AC

## 2020-02-26 MED ORDER — AMOXICILLIN-POT CLAVULANATE 875-125 MG PO TABS: 1.0000 | ORAL_TABLET | Freq: Two times a day (BID) | ORAL | 0 refills | Status: AC

## 2020-02-26 MED ORDER — ONDANSETRON HCL 4 MG PO TABS: 4.0000 mg | ORAL_TABLET | Freq: Three times a day (TID) | ORAL | 0 refills | Status: DC | PRN

## 2020-02-26 NOTE — Progress Notes (Signed)
I connected with Brittany Davila on 02/26/20 at  2:00 PM EST by video and verified that I am speaking with the correct person using two identifiers.   I discussed the limitations, risks, security and privacy concerns of performing an evaluation and management service by video and the availability of in person appointments. I also discussed with the patient that there may be a patient responsible charge related to this service. The patient expressed understanding and agreed to proceed.  Patient location: Home Provider Location: [place] Participants: Lesleigh Noe and Arionne SINDIA KOWALCZYK   Subjective:     Brittany Davila is a 45 y.o. female presenting for Covid Positive (02/19/20 with at home test) and Nasal Congestion (Green mucous w/ occasional blood  )     Sinus Problem This is a new problem. The current episode started in the past 7 days. The problem is unchanged. There has been no fever. Associated symptoms include congestion, headaches and sinus pressure. Pertinent negatives include no chills, coughing, shortness of breath or sore throat. Past treatments include oral decongestants and saline sprays (allegra D, tylenol sinus and pressure). The treatment provided moderate relief.    Tightness in chest Comes and goes Not sure if anxiety or covid Started during covid No sob Just chest tightness  Review of Systems  Constitutional: Positive for appetite change. Negative for chills and fever.  HENT: Positive for congestion, dental problem (pain) and sinus pressure. Negative for postnasal drip and sore throat.   Respiratory: Negative for cough and shortness of breath.   Gastrointestinal: Positive for nausea and vomiting.  Neurological: Positive for headaches.     Social History   Tobacco Use  Smoking Status Never Smoker  Smokeless Tobacco Never Used        Objective:   BP Readings from Last 3 Encounters:  10/13/19 132/86  03/11/19 107/68  11/06/18 106/72   Wt Readings  from Last 3 Encounters:  10/13/19 210 lb 8 oz (95.5 kg)  03/11/19 204 lb 6.4 oz (92.7 kg)  11/06/18 198 lb (89.8 kg)   Pulse 65   Temp 98.1 F (36.7 C) (Temporal)   LMP 02/18/2020 (Exact Date)    Physical Exam Constitutional:      Appearance: Normal appearance. She is not ill-appearing.  HENT:     Head: Normocephalic and atraumatic.     Right Ear: External ear normal.     Left Ear: External ear normal.  Eyes:     Conjunctiva/sclera: Conjunctivae normal.  Pulmonary:     Effort: Pulmonary effort is normal. No respiratory distress.  Neurological:     Mental Status: She is alert. Mental status is at baseline.  Psychiatric:        Mood and Affect: Mood normal.        Behavior: Behavior normal.        Thought Content: Thought content normal.        Judgment: Judgment normal.             Assessment & Plan:   Problem List Items Addressed This Visit   None   Visit Diagnoses    Acute non-recurrent maxillary sinusitis    -  Primary   Relevant Medications   amoxicillin-clavulanate (AUGMENTIN) 875-125 MG tablet   predniSONE (DELTASONE) 20 MG tablet   Nausea and vomiting, intractability of vomiting not specified, unspecified vomiting type       Relevant Medications   ondansetron (ZOFRAN) 4 MG tablet   COVID-19 virus infection  Relevant Medications   predniSONE (DELTASONE) 20 MG tablet     Chest tightness w/o pain and not persisting - ER precautions discussed. Advised that prednisone could be started to see if it resolves. But could wait a few days  Abx for possible bacterial sinusitis given persistence   Return if symptoms worsen or fail to improve.  Lesleigh Noe, MD

## 2020-02-28 ENCOUNTER — Other Ambulatory Visit: Payer: Self-pay | Admitting: Adult Health

## 2020-02-28 DIAGNOSIS — F411 Generalized anxiety disorder: Secondary | ICD-10-CM

## 2020-03-24 ENCOUNTER — Other Ambulatory Visit: Payer: Self-pay | Admitting: Adult Health

## 2020-03-24 DIAGNOSIS — G4700 Insomnia, unspecified: Secondary | ICD-10-CM

## 2020-04-13 ENCOUNTER — Ambulatory Visit (INDEPENDENT_AMBULATORY_CARE_PROVIDER_SITE_OTHER): Payer: BC Managed Care – PPO | Admitting: Adult Health

## 2020-04-13 ENCOUNTER — Other Ambulatory Visit: Payer: Self-pay

## 2020-04-13 ENCOUNTER — Encounter: Payer: Self-pay | Admitting: Adult Health

## 2020-04-13 DIAGNOSIS — F411 Generalized anxiety disorder: Secondary | ICD-10-CM

## 2020-04-13 DIAGNOSIS — F319 Bipolar disorder, unspecified: Secondary | ICD-10-CM

## 2020-04-13 DIAGNOSIS — F331 Major depressive disorder, recurrent, moderate: Secondary | ICD-10-CM

## 2020-04-13 DIAGNOSIS — G4700 Insomnia, unspecified: Secondary | ICD-10-CM

## 2020-04-13 MED ORDER — ZOLPIDEM TARTRATE ER 12.5 MG PO TBCR: 12.5000 mg | EXTENDED_RELEASE_TABLET | Freq: Every day | ORAL | 2 refills | Status: DC

## 2020-04-13 MED ORDER — LAMOTRIGINE 150 MG PO TABS: ORAL_TABLET | ORAL | 5 refills | Status: DC

## 2020-04-13 MED ORDER — GABAPENTIN 100 MG PO CAPS: ORAL_CAPSULE | ORAL | 5 refills | Status: DC

## 2020-04-13 MED ORDER — ALPRAZOLAM 1 MG PO TABS: ORAL_TABLET | ORAL | 2 refills | Status: DC

## 2020-04-13 NOTE — Progress Notes (Signed)
Brittany Davila [id number] 1975/07/31 45 y.o.  Subjective:   Patient ID:  Brittany Davila is a 45 y.o. (DOB Jul 20, 1975) female.  Chief Complaint: No chief complaint on file.   HPI [place] presents to the office today for follow-up of GAD, MDD, insomnia, and BPD.   Describes mood today as "ok". Pleasant. Mood symptoms - reports "anxiety" with increased stressors this month. Denies depression. Irritable at times. She and family recovered from [facility] - [facility].  Stable interest and motivation. Takiedications as prescribed.  Energy levels stable. Active, does not have a regular exercise routine.  Enjoys some usual interests and activities. Married. Lives with husband and 67 year old daughter. Spending time with family. Appetite adequate. Weight loss 8 pounds - 200 pounds.. Sleeps well most nights. Averages 7 hours with Ambien 12.5mg  some nights.  Focus and concentration stable. Completing tasks. Managing aspects of household. Workl going well. Denies SI or HI.  Denies AH or VH.    GAD-7   Flowsheet Row Office Visit from 10/13/2019 in [place] at [facility]  Total GAD-7 Score 7    [place] Office Visit from 10/13/2019 in [place] at [facility] Visit from 11/19/2017 in [place] at [facility] Visit from 03/08/2017 in [place] at [facility] Visit from 12/25/2016 in [place] at [facility]  PHQ-2 Total Score 2 0 0 0  PHQ-9 Total Score 11 -- -- --       Review of Systems:  Review of Systems  Musculoskeletal: Negative for gait problem.  Neurological: Negative for tremors.  Psychiatric/Behavioral:       Please refer to HPI    Medications: I have reviewed the patient's current medications.  Current Outpatient Medications  Medication Sig Dispense Refill  . ALPRAZolam (XANAX) 1 MG tablet TAKE 1/2 TABLET BY MOUTH TWICE DAILY 30 tablet 2  . gabapentin (NEURONTIN) 100 MG capsule Take two capsules three  times daily. 180 capsule 5  . lamoTRIgine (LAMICTAL) 150 MG tablet Take one tablet daily. 30 tablet 5  . ondansetron (ZOFRAN) 4 MG tablet Take 1 tablet (4 mg total) by mouth every 8 (eight) hours as needed for nausea or vomiting. 20 tablet 0  . propranolol (INDERAL) 60 MG tablet TAKE 1 TABLET BY MOUTH 2 TIMES DAILY. (Patient taking differently: Take 60 mg by mouth at bedtime.) 60 tablet 1  . SUMAtriptan (IMITREX) 50 MG tablet TAKE 1 TABLET BY MOUTH AT ONSET OF HEADACHE, MAY REPEAT IN 2 HOURS IF HEADACHE PERSISTS OR RECURS. (Patient not taking: Reported on 02/26/2020) 10 tablet 1  . zolpidem (AMBIEN CR) 12.5 MG CR tablet Take 1 tablet (12.5 mg total) by mouth at bedtime. 30 tablet 2   No current facility-administered medications for this visit.    Medication Side Effects: None  Allergies:  Allergies  Allergen Reactions  . Pollen Extract Other (See Comments)    Past Medical History:  Diagnosis Date  . Anxiety   . Basal cell carcinoma (BCC) of face   . Bell's palsy    H/O  . Bipolar 1 disorder ([place])   . Depression   . Headache    MIGRAINES  . History of genital warts     Family History  Problem Relation Age of Onset  . Diabetes Maternal Grandfather   . Breast cancer Paternal Grandmother 69  . Colon cancer Paternal Grandfather 49  . Pancreatic cancer Paternal Grandfather   . Depression Mother   .  Depression Father   . Lung cancer Maternal Aunt     Social History   Socioeconomic History  . Marital status: Married    Spouse name: Marcello Moores  . Number of children: 1  . Years of education: [employer]  . Highest education level: Not on file  Occupational History  . Not on file  Tobacco Use  . Smoking status: Never Smoker  . Smokeless tobacco: Never Used  Vaping Use  . Vaping Use: Never used  Substance and Sexual Activity  . Alcohol use: Yes    Alcohol/week: 0.0 standard drinks    Comment: 1-2 times a month  . Drug use: No  . Sexual activity: Yes    Birth  control/protection: Surgical  Other Topics Concern  . 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      Family: Network engineer (2008)      Enjoys: watch TV - [place] Drama      Exercise: not currently   Diet: not great - meat - starch veggie, fast food - twice daily      Safety   Seat belts: Yes    Guns: Yes  and secure   Safe in relationships: Yes    Social Determinants of Health   Financial Resource Strain: Not on file  Food Insecurity: Not on file  Transportation Needs: Not on file  Physical Activity: Not on file  Stress: Not on file  Social Connections: Not on file  Intimate Partner Violence: Not on file    Past Medical History, Surgical history, Social history, and Family history were reviewed and updated as appropriate.   Please see review of systems for further details on the patient's review from today.   Objective:   Physical Exam:  There were no vitals taken for this visit.  Physical Exam Constitutional:      General: She is not in acute distress. Musculoskeletal:        General: No deformity.  Neurological:     Mental Status: She is alert and oriented to person, place, and time.     Coordination: Coordination normal.  Psychiatric:        Attention and Perception: Attention and perception normal. She does not perceive auditory or visual hallucinations.        Mood and Affect: Mood normal. Mood is not anxious or depressed. Affect is not labile, blunt, angry or inappropriate.        Speech: Speech normal.        Behavior: Behavior normal.        Thought Content: Thought content normal. Thought content is not paranoid or delusional. Thought content does not include homicidal or suicidal ideation. Thought content does not include homicidal or suicidal plan.        Cognition and Memory: Cognition and memory normal.        Judgment: Judgment normal.      Comments: Insight intact     Lab Review:     Component Value Date/Time   NA 141 10/13/2019 1302   K 3.8 10/13/2019 1302   CL 105 10/13/2019 1302   CO2 27 10/13/2019 1302   GLUCOSE 75 10/13/2019 1302   BUN 11 10/13/2019 1302   CREATININE 0.99 10/13/2019 1302   CALCIUM 9.4 10/13/2019 1302   PROT 6.9 10/13/2019 1302   ALBUMIN 4.6 10/13/2019 1302   AST 13 10/13/2019  1302   ALT 14 10/13/2019 1302   ALKPHOS 65 10/13/2019 1302   BILITOT 0.5 10/13/2019 1302   GFRNONAA >60 02/18/2017 1320   GFRAA >60 02/18/2017 1320       Component Value Date/Time   WBC 6.8 10/13/2019 1302   RBC 4.21 10/13/2019 1302   HGB 12.9 10/13/2019 1302   HCT 38.5 10/13/2019 1302   PLT 307.0 10/13/2019 1302   MCV 91.5 10/13/2019 1302   MCH 30.6 02/18/2017 1320   MCHC 33.5 10/13/2019 1302   RDW 13.0 10/13/2019 1302    No results found for: POCLITH, LITHIUM   No results found for: PHENYTOIN, PHENOBARB, VALPROATE, CBMZ   .res Assessment: Plan:    Plan:  1. Ambien CR 12.5mg  at hs 2. Xanax 1mg  - 1/2 tab BID 3. Lamictal 150mg  at hs 4. Gabapentin 200mg  TID 5. Abilify 2mg  daily   RTC 3 months  Patient advised to contact office with any questions, adverse effects, or acute worsening in signs and symptoms.  Discussed potential benefits, risk, and side effects of benzodiazepines to include potential risk of tolerance and dependence, as well as possible drowsiness.  Advised patient not to drive if experiencing drowsiness and to take lowest possible effective dose to minimize risk of dependence and tolerance.  Discussed potential metabolic side effects associated with atypical antipsychotics, as well as potential risk for movement side effects. Advised pt to contact office if movement side effects occur.    Diagnoses and all orders for this visit:  Generalized anxiety disorder -     ALPRAZolam (XANAX) 1 MG tablet; TAKE 1/2 TABLET BY MOUTH TWICE DAILY -     gabapentin (NEURONTIN) 100 MG capsule; Take  two capsules three times daily.  Insomnia, unspecified type -     zolpidem (AMBIEN CR) 12.5 MG CR tablet; Take 1 tablet (12.5 mg total) by mouth at bedtime.  Bipolar 1 disorder (HCC) -     lamoTRIgine (LAMICTAL) 150 MG tablet; Take one tablet daily.  Major depressive disorder, recurrent episode, moderate (HCC) -     lamoTRIgine (LAMICTAL) 150 MG tablet; Take one tablet daily.     Please see After Visit Summary for patient specific instructions.  No future appointments.  No orders of the defined types were placed in this encounter.   -------------------------------

## 2020-05-05 ENCOUNTER — Encounter: Payer: Self-pay | Admitting: Family Medicine

## 2020-05-08 ENCOUNTER — Other Ambulatory Visit: Payer: Self-pay | Admitting: Neurology

## 2020-06-30 ENCOUNTER — Telehealth: Payer: Self-pay | Admitting: Neurology

## 2020-06-30 NOTE — Telephone Encounter (Signed)
Noted. Headaches, paresthesias.

## 2020-06-30 NOTE — Telephone Encounter (Signed)
..   Pt understands that although there may be some limitations with this type of visit, we will take all precautions to reduce any security or privacy concerns.  Pt understands that this will be treated like an in office visit and we will file with pt's insurance, and there may be a patient responsible charge related to this service. ? ?

## 2020-07-05 ENCOUNTER — Telehealth: Payer: BC Managed Care – PPO | Admitting: Neurology

## 2020-07-12 ENCOUNTER — Encounter: Payer: Self-pay | Admitting: Neurology

## 2020-07-12 ENCOUNTER — Ambulatory Visit: Payer: BC Managed Care – PPO | Admitting: Neurology

## 2020-07-12 VITALS — BP 106/76 | HR 101 | Wt 200.0 lb

## 2020-07-12 DIAGNOSIS — R112 Nausea with vomiting, unspecified: Secondary | ICD-10-CM | POA: Diagnosis not present

## 2020-07-12 DIAGNOSIS — R202 Paresthesia of skin: Secondary | ICD-10-CM | POA: Diagnosis not present

## 2020-07-12 DIAGNOSIS — G43009 Migraine without aura, not intractable, without status migrainosus: Secondary | ICD-10-CM | POA: Diagnosis not present

## 2020-07-12 MED ORDER — ONDANSETRON HCL 4 MG PO TABS: 4.0000 mg | ORAL_TABLET | Freq: Three times a day (TID) | ORAL | 3 refills | Status: DC | PRN

## 2020-07-12 MED ORDER — PROPRANOLOL HCL 60 MG PO TABS: 60.0000 mg | ORAL_TABLET | Freq: Two times a day (BID) | ORAL | 3 refills | Status: DC

## 2020-07-12 MED ORDER — RIZATRIPTAN BENZOATE 10 MG PO TBDP: 10.0000 mg | ORAL_TABLET | ORAL | 11 refills | Status: DC | PRN

## 2020-07-12 NOTE — Patient Instructions (Signed)
Restart the propranolol 60 mg twice daily, when starting takine 1/2 tablet twice daily for 1 week, then increase to full tablet twice daily   Stop Imitrex, switch to Maxalt for acute headache   Discuss with urology the MRI findings of the left renal cyst   Call if any worsening headaches   See you back in 6 months

## 2020-07-12 NOTE — Progress Notes (Signed)
PATIENT: Brittany Davila DOB: 1975/10/21  REASON FOR VISIT: follow up HISTORY FROM: patient  HISTORY OF PRESENT ILLNESS: Today 07/12/20  HISTORY  Brittany Davila is a 45 year old female, seen in request by her primary care physician Dr. Lind Guest for evaluation of headaches initial evaluation was on September 05, 2018.   I have reviewed and summarized the referring note from the referring physician.  She had a past medical history of bipolar disorder, has been on stable dose of Lamictal 150 mg twice a day, she began to have migraine headaches since 2018, increased frequency over the past 3 months, she complains of dizziness, especially with sudden positional change, such as bending over, losing her balance easily, she also complains of intermittent right face icy cold sensation, numbness, sometimes she feels her body spinning back-and-forth with movement, she also has bilateral frontal retro-orbital area pressure headaches, she has migraine headaches about couple times each week, she describes migraine as right retro-orbital area severe pounding headache with sensitive to light and noise, 2 to 3 days, she has tried Excedrin Migraine and muscle relaxant with some helps, in addition she complains of intermittent left arm and leg pain, low back pain,   Update November 06, 2018 SS: MRI of the brain 10/02/2018 was normal.    Since starting propanolol, is now having less intense, dull headaches, 2-3 times a week.  It does not prevent her from doing anything.  The headache is located right occipitally, spreads forward, Icy/cold sensation to her right face with headache.  She has history of right-sided Bell's palsy when she was in college, feels that same sensation.  Imitrex has been beneficial, but rarely has to take it.  She will now take Excedrin Migraine with modest benefit.  She reports stress, works full-time, has a 44 year old, taking 2 college classes.  With her headaches, she reports photophobia,  phonophobia, at times nausea.  She denies weakness or drooping of the face.  She reports 2 years ago, she developed left-sided low back pain, numbness to her left leg, pain down her leg, hot/cold sensation to her left calf, behind her left knee.  At times, her left arm will go numb.  She says she feels the best when she is standing.  She has had MRI of her lumbar spine that was normal.  She completed a few sessions of physical therapy that were beneficial.  She presents today for follow-up unaccompanied.   Update March 11, 2019 SS: At last visit propanolol was increased 60 mg twice a day, EMG nerve evaluation in December 2020 was normal of the left upper and lower extremity.  She continues to complain of numbness to her left leg, pain, hot/cold sensation, from her mid calf up to her mid thigh.  She does not experience the pain if she is up moving around.  It occurs during times of sitting.  The numbness to her left arm, is now very rare.  She denies bowel or urinary incontinence.  She has not had any falls.  She denies any back pain.  The physical therapy she did prior, was helpful for the back pain, not the leg symptoms.  Her headaches have improved with propanolol.  She says she may have 1 dull headache a week, that is relieved with Tylenol or Excedrin Migraine to her right occipital area.  She says also at night when lying flat, on her wedge pillow, she may have numbness to the right side of her face.  She is now taking gabapentin from her psychiatrist for irritability.  She has Abilify to take if the gabapentin does not work.  The gabapentin has also helped with her leg pain.  She presents today for evaluation unaccompanied.  Update July 12, 2020 SS: Here today for follow-up unaccompanied, left leg paresthesia is 80% resolved.  Now taking zinc, vitamin D3, B6, B complex.  Did a food sensitivity test, made eliminations.  Has been out of propanolol for several months.  On average 1-2 migraines a week.   Does not like the way Imitrex makes her feel (dizzy, nauseated, tingling "weird", can only take when at home, starts with Excedrin Migraine.  Missing 1 to 2 days a month. Works at [employer] with [employer]. Has FMLA for bipolar disorder.   Saw urology, known left kidney stone, left renal cyst was not mentioned.  Mood is stable, continues to see psychiatry. Feels more agitated with headache.  MRI of thoracic spine was normal, incidental left renal cystic structure measuring 4.9 cm.  MRI of cervical spine was normal.  REVIEW OF SYSTEMS: Out of a complete 14 system review of symptoms, the patient complains only of the following symptoms, and all other reviewed systems are negative.  Headache  ALLERGIES: Allergies  Allergen Reactions   Pollen Extract Other (See Comments)    HOME MEDICATIONS: Outpatient Medications Prior to Visit  Medication Sig Dispense Refill   ALPRAZolam (XANAX) 1 MG tablet TAKE 1/2 TABLET BY MOUTH TWICE DAILY 30 tablet 2   gabapentin (NEURONTIN) 100 MG capsule Take two capsules three times daily. 180 capsule 5   lamoTRIgine (LAMICTAL) 150 MG tablet Take one tablet daily. 30 tablet 5   zolpidem (AMBIEN CR) 12.5 MG CR tablet Take 1 tablet (12.5 mg total) by mouth at bedtime. 30 tablet 2   ondansetron (ZOFRAN) 4 MG tablet Take 1 tablet (4 mg total) by mouth every 8 (eight) hours as needed for nausea or vomiting. 20 tablet 0   propranolol (INDERAL) 60 MG tablet TAKE 1 TABLET BY MOUTH 2 TIMES DAILY. (Patient taking differently: Take 60 mg by mouth at bedtime.) 60 tablet 1   SUMAtriptan (IMITREX) 50 MG tablet TAKE 1 TABLET BY MOUTH AT ONSET OF HEADACHE, MAY REPEAT IN 2 HOURS IF HEADACHE PERSISTS OR RECURS. (Patient not taking: Reported on 02/26/2020) 10 tablet 1   No facility-administered medications prior to visit.    PAST MEDICAL HISTORY: Past Medical History:  Diagnosis Date   Anxiety    Basal cell carcinoma (BCC) of face    Bell's palsy    H/O   Bipolar 1 disorder  ([place])    Depression    Headache    MIGRAINES   History of genital warts     PAST SURGICAL HISTORY: Past Surgical History:  Procedure Laterality Date   BREAST BIOPSY Right 07/03/2018   [place] bx           heart marker              path pending   BREAST CYST ASPIRATION Left 2014   by Dr. Tamala Julian   LAPAROSCOPIC TUBAL LIGATION Bilateral 11/10/2016   Procedure: LAPAROSCOPIC TUBAL LIGATION;  Surgeon: Benjaman Kindler, MD;  Location: [facility] ORS;  Service: Gynecology;  Laterality: Bilateral;   MOHS SURGERY     TONSILLECTOMY AND ADENOIDECTOMY     TUBAL LIGATION     tubes in right ear      FAMILY HISTORY: Family History  Problem Relation Age of Onset   Diabetes  Maternal Grandfather    Breast cancer Paternal Grandmother 33   Colon cancer Paternal Grandfather 20   Pancreatic cancer Paternal Grandfather    Depression Mother    Depression Father    Lung cancer Maternal Aunt     SOCIAL HISTORY: Social History   Socioeconomic History   Marital status: Married    Spouse name: Marcello Moores   Number of children: 1   Years of education: Grad school   Highest education level: Not on file  Occupational History   Not on file  Tobacco Use   Smoking status: Never   Smokeless tobacco: Never  Vaping Use   Vaping Use: Never used  Substance and Sexual Activity   Alcohol use: Yes    Alcohol/week: 0.0 standard drinks    Comment: 1-2 times a month   Drug use: No   Sexual activity: Yes    Birth control/protection: Surgical  Other Topics Concern   Not on file  Social History Narrative   10/13/19   From: [place]   Living: with husband, Marcello Moores [place]) - 2005   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      Family: Network engineer (2008)      Enjoys: watch TV - [place] Drama      Exercise: not currently   Diet: not great - meat - starch veggie, fast food - twice daily      Safety   Seat belts: Yes    Guns: Yes  and secure   Safe in relationships: Yes    Social Determinants of Health   Financial  Resource Strain: Not on file  Food Insecurity: Not on file  Transportation Needs: Not on file  Physical Activity: Not on file  Stress: Not on file  Social Connections: Not on file  Intimate Partner Violence: Not on file   PHYSICAL EXAM  Vitals:   07/12/20 0814  BP: 106/76  Pulse: (!) 101  Weight: 200 lb (90.7 kg)    Body mass index is 30.63 kg/m.  Generalized: Well developed, in no acute distress  Neurological examination  Mentation: Alert oriented to time, place, history taking. Follows all commands speech and language fluent Cranial nerve II-XII: Pupils were equal round reactive to light. Extraocular movements were full, visual field were full on confrontational test. Facial sensation and strength were normal. Head turning and shoulder shrug were normal and symmetric. Motor: The motor testing reveals 5 over 5 strength of all 4 extremities. Good symmetric motor tone is noted throughout.  Sensory: Reports decreased sensation to soft touch to right lower extremity. Coordination: Cerebellar testing reveals good finger-nose-finger and heel-to-shin bilaterally.  Gait and station: Gait is normal. Tandem gait is normal.  Reflexes: Deep tendon reflexes are symmetric and normal bilaterally.   DIAGNOSTIC DATA (LABS, IMAGING, TESTING) - I reviewed patient records, labs, notes, testing and imaging myself where available.  Lab Results  Component Value Date   WBC 6.8 10/13/2019   HGB 12.9 10/13/2019   HCT 38.5 10/13/2019   MCV 91.5 10/13/2019   PLT 307.0 10/13/2019      Component Value Date/Time   NA 141 10/13/2019 1302   K 3.8 10/13/2019 1302   CL 105 10/13/2019 1302   CO2 27 10/13/2019 1302   GLUCOSE 75 10/13/2019 1302   BUN 11 10/13/2019 1302   CREATININE 0.99 10/13/2019 1302   CALCIUM 9.4 10/13/2019 1302   PROT 6.9 10/13/2019 1302   ALBUMIN 4.6 10/13/2019 1302   AST 13 10/13/2019 1302   ALT 14 10/13/2019 1302  ALKPHOS 65 10/13/2019 1302   BILITOT 0.5 10/13/2019 1302    GFRNONAA >60 02/18/2017 1320   GFRAA >60 02/18/2017 1320   Lab Results  Component Value Date   CHOL 171 10/13/2019   HDL 51.40 10/13/2019   LDLCALC 101 (H) 10/13/2019   TRIG 93.0 10/13/2019   CHOLHDL 3 10/13/2019   Lab Results  Component Value Date   HGBA1C 5.6 10/13/2019   Lab Results  Component Value Date   VITAMINB12 285 10/13/2019   Lab Results  Component Value Date   TSH 2.15 08/16/2017   ASSESSMENT AND PLAN 45 y.o. year old female  has a past medical history of Anxiety, Basal cell carcinoma (BCC) of face, Bell's palsy, Bipolar 1 disorder ([place]), Depression, Headache, and History of genital warts. here with:  1.  Chronic migraine headache -Restart propanolol working up to 60 mg twice daily -Stop Imitrex, switch to Maxalt 10 mg ODT for acute headache, may combine with Zofran for nausea with headache -MRI of the brain was normal September 2020 -If needed may consider, Effexor to help with headache, also help mood  -Follow-up in 6 months or sooner if needed  2.  Paresthesia left arm and leg -Doing better, 80% resolved -EMG evaluation in December 2020 was normal of the left arm and left leg -MRI lumbar spine in November 2019 was normal -MRI of thoracic spine was normal, incidental left renal cystic structure measuring 4.9 cm.  MRI of cervical spine was normal -Printed her MRI report, she will reach out to urology to ensure are aware of cystic finding  3.  Bipolar disorder, anxiety -Remains under the care of psychiatry, on Lamictal and gabapentin, xanax   Butler Denmark, AGNP-C, DNP 07/12/2020, 9:31 AM [facility] [address] [place], [place] [address] [phone]

## 2020-09-15 ENCOUNTER — Other Ambulatory Visit: Payer: Self-pay | Admitting: Adult Health

## 2020-09-15 DIAGNOSIS — G4700 Insomnia, unspecified: Secondary | ICD-10-CM

## 2020-09-16 NOTE — Telephone Encounter (Signed)
Last filled 7/10 appt on 9/15

## 2020-09-20 ENCOUNTER — Other Ambulatory Visit: Payer: Self-pay | Admitting: Obstetrics and Gynecology

## 2020-09-20 DIAGNOSIS — Z1231 Encounter for screening mammogram for malignant neoplasm of breast: Secondary | ICD-10-CM

## 2020-09-28 ENCOUNTER — Other Ambulatory Visit: Payer: Self-pay | Admitting: Adult Health

## 2020-09-28 DIAGNOSIS — F331 Major depressive disorder, recurrent, moderate: Secondary | ICD-10-CM

## 2020-09-28 DIAGNOSIS — F319 Bipolar disorder, unspecified: Secondary | ICD-10-CM

## 2020-10-13 NOTE — Progress Notes (Signed)
Chart reviewed, agree above plan ?

## 2020-10-14 ENCOUNTER — Encounter: Payer: Self-pay | Admitting: Adult Health

## 2020-10-14 ENCOUNTER — Other Ambulatory Visit: Payer: Self-pay

## 2020-10-14 ENCOUNTER — Ambulatory Visit (INDEPENDENT_AMBULATORY_CARE_PROVIDER_SITE_OTHER): Payer: BC Managed Care – PPO | Admitting: Adult Health

## 2020-10-14 DIAGNOSIS — F319 Bipolar disorder, unspecified: Secondary | ICD-10-CM | POA: Diagnosis not present

## 2020-10-14 DIAGNOSIS — F411 Generalized anxiety disorder: Secondary | ICD-10-CM

## 2020-10-14 DIAGNOSIS — G4700 Insomnia, unspecified: Secondary | ICD-10-CM | POA: Diagnosis not present

## 2020-10-14 DIAGNOSIS — F331 Major depressive disorder, recurrent, moderate: Secondary | ICD-10-CM

## 2020-10-14 MED ORDER — ALPRAZOLAM 1 MG PO TABS: ORAL_TABLET | ORAL | 2 refills | Status: DC

## 2020-10-14 MED ORDER — GABAPENTIN 100 MG PO CAPS: ORAL_CAPSULE | ORAL | 3 refills | Status: DC

## 2020-10-14 MED ORDER — ZOLPIDEM TARTRATE ER 12.5 MG PO TBCR: 12.5000 mg | EXTENDED_RELEASE_TABLET | Freq: Every day | ORAL | 2 refills | Status: DC

## 2020-10-14 MED ORDER — LAMOTRIGINE 150 MG PO TABS: ORAL_TABLET | ORAL | 3 refills | Status: DC

## 2020-10-14 NOTE — Progress Notes (Signed)
Brittany Davila [id number] 1975-11-01 45 y.o.  Subjective:   Patient ID:  Brittany Davila is a 45 y.o. (DOB 11/16/1975) female.  Chief Complaint: No chief complaint on file.  HPI  [name] presents to the office today for follow-up of GAD, MDD, insomnia, and BPD.   Describes mood today as "ok". Pleasant. Mood symptoms - reports anxiety, depression, and irritability. Stating "I'm not doing as well I was". She and husband having health issues. Reports some hormonal imbalances - working with GYN - started on [facility], but had to stop. Has Abilify '2mg'$  , but has not started it - plans to start tonight. Stable interest and motivation. Take medications as prescribed.  Energy levels stable. Active, does not have a regular exercise routine. Has set of goal to walk 5,000 steps. Enjoys some usual interests and activities. Married. Lives with husband and 11 year old daughter. Spending time with family. Appetite adequate. Weight loss 8 pounds - 200 pounds.. Sleeping difficulties over the past 2 to 3 months. Averages 4 to 5 hours. Focus and concentration stable. Completing tasks. Managing aspects of household. 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  Denies AH or VH.   GAD-7    Flowsheet Row Office Visit from 10/13/2019 in [place] at [facility]  Total GAD-7 Score 7      Brittany Davila Office Visit from 10/13/2019 in [place] at [facility] Visit from 11/19/2017 in [place] at [facility] Visit from 03/08/2017 in [place] at [facility] Visit from 12/25/2016 in [place] at [facility]  PHQ-2 Total Score 2 0 0 0  PHQ-9 Total Score 11 -- -- --        Review of Systems:  Review of Systems  Musculoskeletal:  Negative for gait problem.  Neurological:  Negative for tremors.  Psychiatric/Behavioral:         Please refer to HPI   Medications: I have reviewed the patient's current medications.  Current Outpatient  Medications  Medication Sig Dispense Refill   ALPRAZolam (XANAX) 1 MG tablet TAKE 1/2 TABLET BY MOUTH TWICE DAILY 30 tablet 2   gabapentin (NEURONTIN) 100 MG capsule Take two capsules three times daily. 180 capsule 5   lamoTRIgine (LAMICTAL) 150 MG tablet TAKE 1 TABLET BY MOUTH EVERY DAY 90 tablet 0   ondansetron (ZOFRAN) 4 MG tablet Take 1 tablet (4 mg total) by mouth every 8 (eight) hours as needed for nausea or vomiting. 20 tablet 3   propranolol (INDERAL) 60 MG tablet Take 1 tablet (60 mg total) by mouth 2 (two) times daily. 180 tablet 3   rizatriptan (MAXALT-MLT) 10 MG disintegrating tablet Take 1 tablet (10 mg total) by mouth as needed for migraine. May repeat in 2 hours if needed 9 tablet 11   zolpidem (AMBIEN CR) 12.5 MG CR tablet TAKE 1 TABLET (12.5 MG TOTAL) BY MOUTH AT BEDTIME. 30 tablet 0   No current facility-administered medications for this visit.    Medication Side Effects: None  Allergies:  Allergies  Allergen Reactions   Pollen Extract Other (See Comments)    Past Medical History:  Diagnosis Date   Anxiety    Basal cell carcinoma (BCC) of face    Bell's palsy    H/O   Bipolar 1 disorder (HCC)    Depression    Headache    MIGRAINES   History of genital warts     Past Medical History, Surgical  history, Social history, and Family history were reviewed and updated as appropriate.   Please see review of systems for further details on the patient's review from today.   Objective:   Physical Exam:  There were no vitals taken for this visit.  Physical Exam Constitutional:      General: She is not in acute distress. Musculoskeletal:        General: No deformity.  Neurological:     Mental Status: She is alert and oriented to person, place, and time.     Coordination: Coordination normal.  Psychiatric:        Attention and Perception: Attention and perception normal. She does not perceive auditory or visual hallucinations.        Mood and Affect: Mood  normal. Mood is not anxious or depressed. Affect is not labile, blunt, angry or inappropriate.        Speech: Speech normal.        Behavior: Behavior normal.        Thought Content: Thought content normal. Thought content is not paranoid or delusional. Thought content does not include homicidal or suicidal ideation. Thought content does not include homicidal or suicidal plan.        Cognition and Memory: Cognition and memory normal.        Judgment: Judgment normal.     Comments: Insight intact    Lab Review:     Component Value Date/Time   NA 141 10/13/2019 1302   K 3.8 10/13/2019 1302   CL 105 10/13/2019 1302   CO2 27 10/13/2019 1302   GLUCOSE 75 10/13/2019 1302   BUN 11 10/13/2019 1302   CREATININE 0.99 10/13/2019 1302   CALCIUM 9.4 10/13/2019 1302   PROT 6.9 10/13/2019 1302   ALBUMIN 4.6 10/13/2019 1302   AST 13 10/13/2019 1302   ALT 14 10/13/2019 1302   ALKPHOS 65 10/13/2019 1302   BILITOT 0.5 10/13/2019 1302   GFRNONAA >60 02/18/2017 1320   GFRAA >60 02/18/2017 1320       Component Value Date/Time   WBC 6.8 10/13/2019 1302   RBC 4.21 10/13/2019 1302   HGB 12.9 10/13/2019 1302   HCT 38.5 10/13/2019 1302   PLT 307.0 10/13/2019 1302   MCV 91.5 10/13/2019 1302   MCH 30.6 02/18/2017 1320   MCHC 33.5 10/13/2019 1302   RDW 13.0 10/13/2019 1302    No results found for: POCLITH, LITHIUM   No results found for: PHENYTOIN, PHENOBARB, VALPROATE, CBMZ   .res Assessment: Plan:    Plan:  1. Ambien CR 12.'5mg'$  at hs 2. Xanax '1mg'$  - 1/2 tab BID 3. Lamictal '150mg'$  at hs 4. Gabapentin '200mg'$  TID 5. Abilify '2mg'$  daily   RTC 4 weeks  Patient advised to contact office with any questions, adverse effects, or acute worsening in signs and symptoms.  Discussed potential benefits, risk, and side effects of benzodiazepines to include potential risk of tolerance and dependence, as well as possible drowsiness.  Advised patient not to drive if experiencing drowsiness and to take  lowest possible effective dose to minimize risk of dependence and tolerance.  Discussed potential metabolic side effects associated with atypical antipsychotics, as well as potential risk for movement side effects. Advised pt to contact office if movement side effects occur.    There are no diagnoses linked to this encounter.   Please see After Visit Summary for patient specific instructions.  Future Appointments  Date Time Provider [place]  11/05/2020  3:40 PM [facility]  01/18/2021  3:15 PM Suzzanne Cloud, NP [facility] None    No orders of the defined types were placed in this encounter.   -------------------------------

## 2020-11-05 ENCOUNTER — Ambulatory Visit
Admission: RE | Admit: 2020-11-05 | Discharge: 2020-11-05 | Disposition: A | Discharge status: Home / Self Care | Payer: BC Managed Care – PPO | Source: Ambulatory Visit | Attending: Obstetrics and Gynecology | Admitting: Obstetrics and Gynecology

## 2020-11-05 ENCOUNTER — Other Ambulatory Visit: Payer: Self-pay

## 2020-11-05 DIAGNOSIS — Z1231 Encounter for screening mammogram for malignant neoplasm of breast: Secondary | ICD-10-CM | POA: Insufficient documentation

## 2020-11-11 ENCOUNTER — Ambulatory Visit: Payer: BC Managed Care – PPO | Admitting: Adult Health

## 2020-11-11 ENCOUNTER — Other Ambulatory Visit: Payer: Self-pay

## 2020-11-11 ENCOUNTER — Encounter: Payer: Self-pay | Admitting: Adult Health

## 2020-11-11 DIAGNOSIS — G4700 Insomnia, unspecified: Secondary | ICD-10-CM

## 2020-11-11 DIAGNOSIS — F331 Major depressive disorder, recurrent, moderate: Secondary | ICD-10-CM

## 2020-11-11 DIAGNOSIS — F319 Bipolar disorder, unspecified: Secondary | ICD-10-CM | POA: Diagnosis not present

## 2020-11-11 DIAGNOSIS — F411 Generalized anxiety disorder: Secondary | ICD-10-CM

## 2020-11-11 NOTE — Progress Notes (Signed)
Brittany Davila [id number] [birth year] 45 y.o.  Subjective:   Patient ID:  Brittany Davila is a 45 y.o. (DOB [birth year]) female.  Chief Complaint: No chief complaint on file.   HPI Hayden presents to the office today for follow-up of GAD, MDD, insomnia, and BPD.   Describes mood today as "ok". Pleasant. Mood symptoms - reports decreased anxiety, depression, and irritability. Stating "I'm feeling better". Stopped the birth control and started the Abilify. Stable interest and motivation. Take medications as prescribed.  Energy levels stable. Active, has a regular exercise routine. Has set a new goal to walk 5,000 to 7500 steps. Enjoys some usual interests and activities. Married. Lives with husband and 44 year old daughter. Spending time with family. Appetite adequate. Weight loss 10 pounds  - 190.4 from 200 pounds.. Sleeping difficulties over the past 2 to 3 months. Averages 4 to 5 hours. Focus and concentration stable. Completing tasks. Managing aspects of household. 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  Denies AH or VH.   GAD-7    Flowsheet Row Office Visit from 10/13/2019 in [place] at [facility]  Total GAD-7 Score 7      [place] Office Visit from 10/13/2019 in [place] at [facility] Visit from 11/19/2017 in [place] at [facility] Visit from 03/08/2017 in [place] at [facility] Visit from 12/25/2016 in [place] at [facility]  PHQ-2 Total Score 2 0 0 0  PHQ-9 Total Score 11 -- -- --        Review of Systems:  Review of Systems  Musculoskeletal:  Negative for gait problem.  Neurological:  Negative for tremors.  Psychiatric/Behavioral:         Please refer to HPI   Medications: I have reviewed the patient's current medications.  Current Outpatient Medications  Medication Sig Dispense Refill   ALPRAZolam (XANAX) 1 MG tablet TAKE 1/2 TABLET BY MOUTH TWICE DAILY 30  tablet 2   gabapentin (NEURONTIN) 100 MG capsule Take two capsules three times daily. 540 capsule 3   lamoTRIgine (LAMICTAL) 150 MG tablet TAKE 1 TABLET BY MOUTH EVERY DAY 90 tablet 3   ondansetron (ZOFRAN) 4 MG tablet Take 1 tablet (4 mg total) by mouth every 8 (eight) hours as needed for nausea or vomiting. 20 tablet 3   propranolol (INDERAL) 60 MG tablet Take 1 tablet (60 mg total) by mouth 2 (two) times daily. 180 tablet 3   rizatriptan (MAXALT-MLT) 10 MG disintegrating tablet Take 1 tablet (10 mg total) by mouth as needed for migraine. May repeat in 2 hours if needed 9 tablet 11   zolpidem (AMBIEN CR) 12.5 MG CR tablet Take 1 tablet (12.5 mg total) by mouth at bedtime. 30 tablet 2   No current facility-administered medications for this visit.    Medication Side Effects: None  Allergies:  Allergies  Allergen Reactions   Pollen Extract Other (See Comments)    Past Medical History:  Diagnosis Date   Anxiety    Basal cell carcinoma (BCC) of face    Bell's palsy    H/O   Bipolar 1 disorder (HCC)    Depression    Headache    MIGRAINES   History of genital warts     Past Medical History, Surgical history, Social history, and Family history were reviewed and updated as appropriate.   Please see review of systems for further details on the patient's review from  today.   Objective:   Physical Exam:  There were no vitals taken for this visit.  Physical Exam Constitutional:      General: She is not in acute distress. Musculoskeletal:        General: No deformity.  Neurological:     Mental Status: She is alert and oriented to person, place, and time.     Coordination: Coordination normal.  Psychiatric:        Attention and Perception: Attention and perception normal. She does not perceive auditory or visual hallucinations.        Mood and Affect: Mood normal. Mood is not anxious or depressed. Affect is not labile, blunt, angry or inappropriate.        Speech: Speech  normal.        Behavior: Behavior normal.        Thought Content: Thought content normal. Thought content is not paranoid or delusional. Thought content does not include homicidal or suicidal ideation. Thought content does not include homicidal or suicidal plan.        Cognition and Memory: Cognition and memory normal.        Judgment: Judgment normal.     Comments: Insight intact    Lab Review:     Component Value Date/Time   NA 141 10/13/2019 1302   K 3.8 10/13/2019 1302   CL 105 10/13/2019 1302   CO2 27 10/13/2019 1302   GLUCOSE 75 10/13/2019 1302   BUN 11 10/13/2019 1302   CREATININE 0.99 10/13/2019 1302   CALCIUM 9.4 10/13/2019 1302   PROT 6.9 10/13/2019 1302   ALBUMIN 4.6 10/13/2019 1302   AST 13 10/13/2019 1302   ALT 14 10/13/2019 1302   ALKPHOS 65 10/13/2019 1302   BILITOT 0.5 10/13/2019 1302   GFRNONAA >60 02/18/2017 1320   GFRAA >60 02/18/2017 1320       Component Value Date/Time   WBC 6.8 10/13/2019 1302   RBC 4.21 10/13/2019 1302   HGB 12.9 10/13/2019 1302   HCT 38.5 10/13/2019 1302   PLT 307.0 10/13/2019 1302   MCV 91.5 10/13/2019 1302   MCH 30.6 02/18/2017 1320   MCHC 33.5 10/13/2019 1302   RDW 13.0 10/13/2019 1302    No results found for: POCLITH, LITHIUM   No results found for: PHENYTOIN, PHENOBARB, VALPROATE, CBMZ   .res Assessment: Plan:    Plan:  1. Ambien CR 12.5mg  at hs 2. Xanax 1mg  - 1/2 tab BID 3. Lamictal 150mg  at hs 4. Gabapentin 200mg  TID 5. Abilify 2mg  daily   RTC 4 weeks  Patient advised to contact office with any questions, adverse effects, or acute worsening in signs and symptoms.  Discussed potential benefits, risk, and side effects of benzodiazepines to include potential risk of tolerance and dependence, as well as possible drowsiness.  Advised patient not to drive if experiencing drowsiness and to take lowest possible effective dose to minimize risk of dependence and tolerance.  Discussed potential metabolic side effects  associated with atypical antipsychotics, as well as potential risk for movement side effects. Advised pt to contact office if movement side effects occur.  Diagnoses and all orders for this visit:  Insomnia, unspecified type  Generalized anxiety disorder  Bipolar 1 disorder ([place])  Major depressive disorder, recurrent episode, moderate ([place])    Please see After Visit Summary for patient specific instructions.  Future Appointments  Date Time Provider [place]  12/02/2020  9:00 AM Shanon Ace, LCSW [facility] None  01/18/2021  3:15 PM Butler Denmark  J, NP [facility] None    No orders of the defined types were placed in this encounter.   -------------------------------

## 2020-12-02 ENCOUNTER — Ambulatory Visit: Payer: BC Managed Care – PPO | Admitting: Psychiatry

## 2021-01-18 ENCOUNTER — Encounter: Payer: Self-pay | Admitting: Neurology

## 2021-01-18 ENCOUNTER — Ambulatory Visit: Payer: BC Managed Care – PPO | Admitting: Neurology

## 2021-01-18 VITALS — BP 124/85 | HR 87 | Ht 67.5 in | Wt 206.5 lb

## 2021-01-18 DIAGNOSIS — G43009 Migraine without aura, not intractable, without status migrainosus: Secondary | ICD-10-CM | POA: Diagnosis not present

## 2021-01-18 MED ORDER — PROPRANOLOL HCL 60 MG PO TABS: 60.0000 mg | ORAL_TABLET | Freq: Two times a day (BID) | ORAL | 3 refills | Status: DC

## 2021-01-18 MED ORDER — AJOVY 225 MG/1.5ML ~~LOC~~ SOAJ: 225.0000 mg | SUBCUTANEOUS | 11 refills | Status: DC

## 2021-01-18 NOTE — Progress Notes (Signed)
PATIENT: Brittany Davila DOB: 1975/12/19  REASON FOR VISIT: follow up HISTORY FROM: patient Primary Neurologist: Dr. Krista Blue   HISTORY  Brittany Davila is a 45 year old female, seen in request by her primary care physician Dr. Lind Guest for evaluation of headaches initial evaluation was on September 05, 2018.   I have reviewed and summarized the referring note from the referring physician.  She had a past medical history of bipolar disorder, has been on stable dose of Lamictal 150 mg twice a day, she began to have migraine headaches since 2018, increased frequency over the past 3 months, she complains of dizziness, especially with sudden positional change, such as bending over, losing her balance easily, she also complains of intermittent right face icy cold sensation, numbness, sometimes she feels her body spinning back-and-forth with movement, she also has bilateral frontal retro-orbital area pressure headaches, she has migraine headaches about couple times each week, she describes migraine as right retro-orbital area severe pounding headache with sensitive to light and noise, 2 to 3 days, she has tried Excedrin Migraine and muscle relaxant with some helps, in addition she complains of intermittent left arm and leg pain, low back pain,   Update November 06, 2018 SS: MRI of the brain 10/02/2018 was normal.    Since starting propanolol, is now having less intense, dull headaches, 2-3 times a week.  It does not prevent her from doing anything.  The headache is located right occipitally, spreads forward, Icy/cold sensation to her right face with headache.  She has history of right-sided Bell's palsy when she was in college, feels that same sensation.  Imitrex has been beneficial, but rarely has to take it.  She will now take Excedrin Migraine with modest benefit.  She reports stress, works full-time, has a 44 year old, taking 2 college classes.  With her headaches, she reports photophobia, phonophobia, at  times nausea.  She denies weakness or drooping of the face.  She reports 2 years ago, she developed left-sided low back pain, numbness to her left leg, pain down her leg, hot/cold sensation to her left calf, behind her left knee.  At times, her left arm will go numb.  She says she feels the best when she is standing.  She has had MRI of her lumbar spine that was normal.  She completed a few sessions of physical therapy that were beneficial.  She presents today for follow-up unaccompanied.   Update March 11, 2019 SS: At last visit propanolol was increased 60 mg twice a day, EMG nerve evaluation in December 2020 was normal of the left upper and lower extremity.  She continues to complain of numbness to her left leg, pain, hot/cold sensation, from her mid calf up to her mid thigh.  She does not experience the pain if she is up moving around.  It occurs during times of sitting.  The numbness to her left arm, is now very rare.  She denies bowel or urinary incontinence.  She has not had any falls.  She denies any back pain.  The physical therapy she did prior, was helpful for the back pain, not the leg symptoms.  Her headaches have improved with propanolol.  She says she may have 1 dull headache a week, that is relieved with Tylenol or Excedrin Migraine to her right occipital area.  She says also at night when lying flat, on her wedge pillow, she may have numbness to the right side of her face.  She is  now taking gabapentin from her psychiatrist for irritability.  She has Abilify to take if the gabapentin does not work.  The gabapentin has also helped with her leg pain.  She presents today for evaluation unaccompanied.  Update July 12, 2020 SS: Here today for follow-up unaccompanied, left leg paresthesia is 80% resolved.  Now taking zinc, vitamin D3, B6, B complex.  Did a food sensitivity test, made eliminations.  Has been out of propanolol for several months.  On average 1-2 migraines a week.  Does not like the  way Imitrex makes her feel (dizzy, nauseated, tingling "weird", can only take when at home, starts with Excedrin Migraine.  Missing 1 to 2 days a month. Works at [employer] with [employer]. Has FMLA for bipolar disorder.   Saw urology, known left kidney stone, left renal cyst was not mentioned.  Mood is stable, continues to see psychiatry. Feels more agitated with headache.  MRI of thoracic spine was normal, incidental left renal cystic structure measuring 4.9 cm.  MRI of cervical spine was normal.  Update January 18, 2021 SS: Taking propranolol 60 twice daily, daily headache, 1 migraine a week, missing 1 day of work a week. Husbands snoring makes it worse usually has in the morning, the Maxalt works but makes sleepy. Has FMLA for bipolar disorder. On hormones progesterone and estradiol for perimenopause. Was tried on antidepressant, make her depressed, almost suicidal. Left sided sensory symptoms essentially resolved.   REVIEW OF SYSTEMS: Out of a complete 14 system review of symptoms, the patient complains only of the following symptoms, and all other reviewed systems are negative.  Headache  ALLERGIES: Allergies  Allergen Reactions   Pollen Extract Other (See Comments)    HOME MEDICATIONS: Outpatient Medications Prior to Visit  Medication Sig Dispense Refill   ALPRAZolam (XANAX) 1 MG tablet TAKE 1/2 TABLET BY MOUTH TWICE DAILY 30 tablet 2   estradiol (CLIMARA - DOSED IN MG/24 HR) 0.1 mg/24hr patch Place 1 patch onto the skin once a week.     fluorouracil (EFUDEX) 5 % cream Apply 1 application topically 2 (two) times daily.     gabapentin (NEURONTIN) 100 MG capsule Take two capsules three times daily. 540 capsule 3   lamoTRIgine (LAMICTAL) 150 MG tablet TAKE 1 TABLET BY MOUTH EVERY DAY 90 tablet 3   ondansetron (ZOFRAN) 4 MG tablet Take 1 tablet (4 mg total) by mouth every 8 (eight) hours as needed for nausea or vomiting. 20 tablet 3   progesterone (PROMETRIUM) 200 MG capsule Take 1  capsule by mouth at bedtime.     rizatriptan (MAXALT-MLT) 10 MG disintegrating tablet Take 1 tablet (10 mg total) by mouth as needed for migraine. May repeat in 2 hours if needed 9 tablet 11   zolpidem (AMBIEN CR) 12.5 MG CR tablet Take 1 tablet (12.5 mg total) by mouth at bedtime. 30 tablet 2   propranolol (INDERAL) 60 MG tablet Take 1 tablet (60 mg total) by mouth 2 (two) times daily. 180 tablet 3   No facility-administered medications prior to visit.    PAST MEDICAL HISTORY: Past Medical History:  Diagnosis Date   Anxiety    Basal cell carcinoma (BCC) of face    Bell's palsy    H/O   Bipolar 1 disorder ([place])    Depression    Headache    MIGRAINES   History of genital warts     PAST SURGICAL HISTORY: Past Surgical History:  Procedure Laterality Date   BREAST BIOPSY Right 07/03/2018  [place] bx           heart marker              path pending   BREAST CYST ASPIRATION Left 2014   by Dr. Tamala Julian   LAPAROSCOPIC TUBAL LIGATION Bilateral 11/10/2016   Procedure: LAPAROSCOPIC TUBAL LIGATION;  Surgeon: Benjaman Kindler, MD;  Location: [facility] ORS;  Service: Gynecology;  Laterality: Bilateral;   MOHS SURGERY     TONSILLECTOMY AND ADENOIDECTOMY     TUBAL LIGATION     tubes in right ear      FAMILY HISTORY: Family History  Problem Relation Age of Onset   Diabetes Maternal Grandfather    Breast cancer Paternal Grandmother 52   Colon cancer Paternal Grandfather 28   Pancreatic cancer Paternal Grandfather    Depression Mother    Depression Father    Lung cancer Maternal Aunt     SOCIAL HISTORY: Social History   Socioeconomic History   Marital status: Married    Spouse name: Marcello Moores   Number of children: 1   Years of education: Grad school   Highest education level: Not on file  Occupational History   Not on file  Tobacco Use   Smoking status: Never   Smokeless tobacco: Never  Vaping Use   Vaping Use: Never used  Substance and Sexual Activity   Alcohol use: Yes     Alcohol/week: 0.0 standard drinks    Comment: 1-2 times a month   Drug use: No   Sexual activity: Yes    Birth control/protection: Surgical  Other Topics Concern   Not on file  Social History Narrative   10/13/19   From: [place]   Living: with husband, Marcello Moores [place]) - 2005   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      Family: Network engineer (2008)      Enjoys: watch TV - [place] Drama      Exercise: not currently   Diet: not great - meat - starch veggie, fast food - twice daily      Safety   Seat belts: Yes    Guns: Yes  and secure   Safe in relationships: Yes    Social Determinants of Health   Financial Resource Strain: Not on file  Food Insecurity: Not on file  Transportation Needs: Not on file  Physical Activity: Not on file  Stress: Not on file  Social Connections: Not on file  Intimate Partner Violence: Not on file   PHYSICAL EXAM  Vitals:   01/18/21 1454  BP: 124/85  Pulse: 87  Weight: 206 lb 8 oz (93.7 kg)  Height: 5' 7.5" (1.715 m)   Body mass index is 31.87 kg/m.  Generalized: Well developed, in no acute distress  Neurological examination  Mentation: Alert oriented to time, place, history taking. Follows all commands speech and language fluent Cranial nerve II-XII: Pupils were equal round reactive to light. Extraocular movements were full, visual field were full on confrontational test. Facial sensation and strength were normal. Head turning and shoulder shrug were normal and symmetric. Motor: The motor testing reveals 5 over 5 strength of all 4 extremities. Good symmetric motor tone is noted throughout.  Sensory: Reports decreased sensation to soft touch to left arm Coordination: Cerebellar testing reveals good finger-nose-finger and heel-to-shin bilaterally.  Gait and station: Gait is normal.  Reflexes: Deep tendon reflexes are symmetric and normal bilaterally.   DIAGNOSTIC DATA (LABS, IMAGING, TESTING) - I reviewed patient records, labs, notes,  testing and imaging myself  where available.  Lab Results  Component Value Date   WBC 6.8 10/13/2019   HGB 12.9 10/13/2019   HCT 38.5 10/13/2019   MCV 91.5 10/13/2019   PLT 307.0 10/13/2019      Component Value Date/Time   NA 141 10/13/2019 1302   K 3.8 10/13/2019 1302   CL 105 10/13/2019 1302   CO2 27 10/13/2019 1302   GLUCOSE 75 10/13/2019 1302   BUN 11 10/13/2019 1302   CREATININE 0.99 10/13/2019 1302   CALCIUM 9.4 10/13/2019 1302   PROT 6.9 10/13/2019 1302   ALBUMIN 4.6 10/13/2019 1302   AST 13 10/13/2019 1302   ALT 14 10/13/2019 1302   ALKPHOS 65 10/13/2019 1302   BILITOT 0.5 10/13/2019 1302   GFRNONAA >60 02/18/2017 1320   GFRAA >60 02/18/2017 1320   Lab Results  Component Value Date   CHOL 171 10/13/2019   HDL 51.40 10/13/2019   LDLCALC 101 (H) 10/13/2019   TRIG 93.0 10/13/2019   CHOLHDL 3 10/13/2019   Lab Results  Component Value Date   HGBA1C 5.6 10/13/2019   Lab Results  Component Value Date   VITAMINB12 285 10/13/2019   Lab Results  Component Value Date   TSH 2.15 08/16/2017   ASSESSMENT AND PLAN 45 y.o. year old female  has a past medical history of Anxiety, Basal cell carcinoma (BCC) of face, Bell's palsy, Bipolar 1 disorder ([place]), Depression, Headache, and History of genital warts. here with:  1.  Chronic migraine headache -Add on Ajovy 225 mg monthly injection for migraine prevention  -Continue propanolol 60 mg twice daily -Continue Maxalt 10 mg as needed for acute headache, discussed trying Nurtec in future, previously tried Imitrex  -MRI of the brain was normal September 2020 -For migraine prevention, is not a candidate for Topamax due to history of kidney stones, antidepressants worsen her depression and make her feel suicidal -Follow-up in 6 months or sooner if needed  2.  Paresthesia left arm and leg -Nearly resolved -EMG evaluation in December 2020 was normal of the left arm and left leg -MRI lumbar spine in November 2019 was  normal -MRI of thoracic spine was normal, incidental left renal cystic structure measuring 4.9 cm.  MRI of cervical spine was normal  3.  Bipolar disorder, anxiety -Remains under the care of psychiatry, on Lamictal and gabapentin, xanax   Butler Denmark, AGNP-C, DNP 01/18/2021, 3:28 PM [facility] [address] [place], [place] [address] [phone]

## 2021-01-18 NOTE — Patient Instructions (Signed)
Start Ajovy 225 mg monthly injection for migraine prevention  Continue Maxalt at onset of headache  Will keep propranolol for migraine prevention  See you back in 6 months

## 2021-01-24 ENCOUNTER — Other Ambulatory Visit: Payer: Self-pay | Admitting: Adult Health

## 2021-01-24 DIAGNOSIS — G4700 Insomnia, unspecified: Secondary | ICD-10-CM

## 2021-01-24 DIAGNOSIS — F411 Generalized anxiety disorder: Secondary | ICD-10-CM

## 2021-01-26 NOTE — Telephone Encounter (Signed)
Filled 11/21 and 11/22 appt on 02/10/21

## 2021-02-10 ENCOUNTER — Ambulatory Visit: Payer: BC Managed Care – PPO | Admitting: Adult Health

## 2021-03-01 ENCOUNTER — Other Ambulatory Visit: Payer: Self-pay | Admitting: Adult Health

## 2021-03-01 DIAGNOSIS — G4700 Insomnia, unspecified: Secondary | ICD-10-CM

## 2021-03-01 DIAGNOSIS — F411 Generalized anxiety disorder: Secondary | ICD-10-CM

## 2021-03-02 ENCOUNTER — Other Ambulatory Visit: Payer: Self-pay

## 2021-03-02 ENCOUNTER — Encounter: Payer: Self-pay | Admitting: Adult Health

## 2021-03-02 ENCOUNTER — Ambulatory Visit: Payer: BC Managed Care – PPO | Admitting: Adult Health

## 2021-03-02 DIAGNOSIS — F411 Generalized anxiety disorder: Secondary | ICD-10-CM | POA: Diagnosis not present

## 2021-03-02 DIAGNOSIS — F331 Major depressive disorder, recurrent, moderate: Secondary | ICD-10-CM | POA: Diagnosis not present

## 2021-03-02 DIAGNOSIS — G4700 Insomnia, unspecified: Secondary | ICD-10-CM

## 2021-03-02 DIAGNOSIS — F319 Bipolar disorder, unspecified: Secondary | ICD-10-CM

## 2021-03-02 MED ORDER — ALPRAZOLAM 1 MG PO TABS: 0.5000 mg | ORAL_TABLET | Freq: Two times a day (BID) | ORAL | 2 refills | Status: DC

## 2021-03-02 MED ORDER — ZOLPIDEM TARTRATE ER 12.5 MG PO TBCR: 12.5000 mg | EXTENDED_RELEASE_TABLET | Freq: Every day | ORAL | 2 refills | Status: DC

## 2021-03-02 NOTE — Progress Notes (Signed)
Brittany Davila [id number] [birth year] 46 y.o.  Subjective:   Patient ID:  Brittany Davila is a 45 y.o. (DOB Jun 19, 1975) female.  Chief Complaint: No chief complaint on file.   HPI [place] presents to the office today for follow-up of GAD, MDD, insomnia, and BPD.   Describes mood today as "ok". Pleasant. Mood symptoms - reports decreased anxiety, depression, and irritability. Stating "I'm feeling alright". Stating January was a rough month. Had some BSC removed from her shoulders, husband having knee issues, dog having teeth removed. Stable interest and motivation. Take medications as prescribed.  Energy levels stable. Active, does not a regular exercise routine.  Enjoys some usual interests and activities. Married. Lives with husband and old daughter. Spending time with family. Appetite adequate. Weight gain 190 - 202 pounds.. Sleeping better some nights than others. Averages 4 to 5 hours. Focus and concentration stable. Completing tasks. Managing aspects of household. 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  Denies AH or VH.   GAD-7    Flowsheet Row Office Visit from 10/13/2019 in [place] at [facility]  Total GAD-7 Score 7      [place] Office Visit from 10/13/2019 in [place] at [facility] Visit from 11/19/2017 in [place] at [facility] Visit from 03/08/2017 in [place] at [facility] Visit from 12/25/2016 in [place] at [facility]  PHQ-2 Total Score 2 0 0 0  PHQ-9 Total Score 11 -- -- --        Review of Systems:  Review of Systems  Musculoskeletal:  Negative for gait problem.  Neurological:  Negative for tremors.  Psychiatric/Behavioral:         Please refer to HPI   Medications: I have reviewed the patient's current medications.  Current Outpatient Medications  Medication Sig Dispense Refill   ALPRAZolam (XANAX) 1 MG tablet Take 0.5 tablets (0.5 mg total) by mouth 2  (two) times daily. 30 tablet 2   estradiol (CLIMARA - DOSED IN MG/24 HR) 0.1 mg/24hr patch Place 1 patch onto the skin once a week.     fluorouracil (EFUDEX) 5 % cream Apply 1 application topically 2 (two) times daily.     Fremanezumab-vfrm (AJOVY) 225 MG/1.5ML SOAJ Inject 225 mg into the skin every 30 (thirty) days. 1.68 mL 11   gabapentin (NEURONTIN) 100 MG capsule Take two capsules three times daily. 540 capsule 3   lamoTRIgine (LAMICTAL) 150 MG tablet TAKE 1 TABLET BY MOUTH EVERY DAY 90 tablet 3   ondansetron (ZOFRAN) 4 MG tablet Take 1 tablet (4 mg total) by mouth every 8 (eight) hours as needed for nausea or vomiting. 20 tablet 3   progesterone (PROMETRIUM) 200 MG capsule Take 1 capsule by mouth at bedtime.     propranolol (INDERAL) 60 MG tablet Take 1 tablet (60 mg total) by mouth 2 (two) times daily. 180 tablet 3   rizatriptan (MAXALT-MLT) 10 MG disintegrating tablet Take 1 tablet (10 mg total) by mouth as needed for migraine. May repeat in 2 hours if needed 9 tablet 11   zolpidem (AMBIEN CR) 12.5 MG CR tablet Take 1 tablet (12.5 mg total) by mouth at bedtime. 30 tablet 2   No current facility-administered medications for this visit.    Medication Side Effects: None  Allergies:  Allergies  Allergen Reactions   Pollen Extract Other (See Comments)    Past Medical History:  Diagnosis Date   Anxiety  Basal cell carcinoma (BCC) of face    Bell's palsy    H/O   Bipolar 1 disorder ([facility])    Depression    Headache    MIGRAINES   History of genital warts     Past Medical History, Surgical history, Social history, and Family history were reviewed and updated as appropriate.   Please see review of systems for further details on the patient's review from today.   Objective:   Physical Exam:  There were no vitals taken for this visit.  Physical Exam Constitutional:      General: She is not in acute distress. Musculoskeletal:        General: No deformity.  Neurological:      Mental Status: She is alert and oriented to person, place, and time.     Coordination: Coordination normal.  Psychiatric:        Attention and Perception: Attention and perception normal. She does not perceive auditory or visual hallucinations.        Mood and Affect: Mood normal. Mood is not anxious or depressed. Affect is not labile, blunt, angry or inappropriate.        Speech: Speech normal.        Behavior: Behavior normal.        Thought Content: Thought content normal. Thought content is not paranoid or delusional. Thought content does not include homicidal or suicidal ideation. Thought content does not include homicidal or suicidal plan.        Cognition and Memory: Cognition and memory normal.        Judgment: Judgment normal.     Comments: Insight intact    Lab Review:     Component Value Date/Time   NA 141 10/13/2019 1302   K 3.8 10/13/2019 1302   CL 105 10/13/2019 1302   CO2 27 10/13/2019 1302   GLUCOSE 75 10/13/2019 1302   BUN 11 10/13/2019 1302   CREATININE 0.99 10/13/2019 1302   CALCIUM 9.4 10/13/2019 1302   PROT 6.9 10/13/2019 1302   ALBUMIN 4.6 10/13/2019 1302   AST 13 10/13/2019 1302   ALT 14 10/13/2019 1302   ALKPHOS 65 10/13/2019 1302   BILITOT 0.5 10/13/2019 1302   GFRNONAA >60 02/18/2017 1320   GFRAA >60 02/18/2017 1320       Component Value Date/Time   WBC 6.8 10/13/2019 1302   RBC 4.21 10/13/2019 1302   HGB 12.9 10/13/2019 1302   HCT 38.5 10/13/2019 1302   PLT 307.0 10/13/2019 1302   MCV 91.5 10/13/2019 1302   MCH 30.6 02/18/2017 1320   MCHC 33.5 10/13/2019 1302   RDW 13.0 10/13/2019 1302    No results found for: POCLITH, LITHIUM   No results found for: PHENYTOIN, PHENOBARB, VALPROATE, CBMZ   .res Assessment: Plan:    Plan:  1. Ambien CR 12.5mg  at hs 2. Xanax 1mg  - 1/2 tab BID 3. Lamictal 150mg  at hs 4. Gabapentin 200mg  TID  RTC 4 weeks  Patient advised to contact office with any questions, adverse effects, or acute worsening  in signs and symptoms.  Discussed potential benefits, risk, and side effects of benzodiazepines to include potential risk of tolerance and dependence, as well as possible drowsiness.  Advised patient not to drive if experiencing drowsiness and to take lowest possible effective dose to minimize risk of dependence and tolerance.  Discussed potential metabolic side effects associated with atypical antipsychotics, as well as potential risk for movement side effects. Advised pt to contact office if movement side  effects occur.   Diagnoses and all orders for this visit:  Bipolar 1 disorder ([place])  Generalized anxiety disorder -     ALPRAZolam (XANAX) 1 MG tablet; Take 0.5 tablets (0.5 mg total) by mouth 2 (two) times daily.  Insomnia, unspecified type -     zolpidem (AMBIEN CR) 12.5 MG CR tablet; Take 1 tablet (12.5 mg total) by mouth at bedtime.  Major depressive disorder, recurrent episode, moderate ([place])     Please see After Visit Summary for patient specific instructions.  Future Appointments  Date Time Provider [place]  07/26/2021  2:45 PM Suzzanne Cloud, NP [facility] None    No orders of the defined types were placed in this encounter.   -------------------------------

## 2021-05-05 ENCOUNTER — Encounter: Payer: Self-pay | Admitting: Adult Health

## 2021-05-05 ENCOUNTER — Ambulatory Visit: Payer: BC Managed Care – PPO | Admitting: Adult Health

## 2021-05-05 DIAGNOSIS — F411 Generalized anxiety disorder: Secondary | ICD-10-CM

## 2021-05-05 DIAGNOSIS — F331 Major depressive disorder, recurrent, moderate: Secondary | ICD-10-CM

## 2021-05-05 DIAGNOSIS — G4700 Insomnia, unspecified: Secondary | ICD-10-CM | POA: Diagnosis not present

## 2021-05-05 DIAGNOSIS — F489 Nonpsychotic mental disorder, unspecified: Secondary | ICD-10-CM

## 2021-05-05 DIAGNOSIS — Z0389 Encounter for observation for other suspected diseases and conditions ruled out: Secondary | ICD-10-CM

## 2021-05-05 DIAGNOSIS — F319 Bipolar disorder, unspecified: Secondary | ICD-10-CM | POA: Diagnosis not present

## 2021-05-05 MED ORDER — ARIPIPRAZOLE 5 MG PO TABS: 5.0000 mg | ORAL_TABLET | Freq: Every day | ORAL | 2 refills | Status: DC

## 2021-05-05 MED ORDER — ALPRAZOLAM 1 MG PO TABS: 0.5000 mg | ORAL_TABLET | Freq: Two times a day (BID) | ORAL | 2 refills | Status: DC

## 2021-05-05 MED ORDER — ZOLPIDEM TARTRATE ER 12.5 MG PO TBCR: 12.5000 mg | EXTENDED_RELEASE_TABLET | Freq: Every day | ORAL | 2 refills | Status: DC

## 2021-05-05 NOTE — Progress Notes (Signed)
JERNI SELMER ?[id number] ?April 08, 1975 ?46 y.o. ? ?Subjective:  ? ?Patient ID:  KASHAY CAVENAUGH is a 46 y.o. (DOB [birth year]) female. ? ?Chief Complaint: No chief complaint on file. ? ? ?HPI ?Brittany Davila presents to the office today for follow-up of GAD, MDD, insomnia, and BPD.  ? ?Describes mood today as "ok". Pleasant. Mood symptoms - reports anxiety, depression, and irritability. Stating "I'm not doing well". Reports "struggling with mood stability over the past 3 weeks. Feels like she needs a medication adjustment. Unable to identify a precipiant. Would like to restart Abilify to help manage mood symptoms. Decreased interest and motivation. Take medications as prescribed.  ?Energy levels decreased - "coming down from a manic high". Active, does not a regular exercise routine.  ?Enjoys some usual interests and activities. Married. Lives with husband and old daughter. Spending time with family. ?Appetite adequate. Weight gain 202 to 206 pounds.Marland Kitchen ?Sleeping difficulties. Averages 3 to 4 hours over the past 2 weeks. ?Focus and concentration difficulties over the past week. Completing tasks. Managing aspects of household. Work going well Social worker. ?Denies SI or HI.  ?Denies AH or VH. ? ? ?GAD-7   ? ?[place] Office Visit from 10/13/2019 in [place] at [place]  ?Total GAD-7 Score 7  ? ?  ? ?PHQ2-9   ? ?[place] Office Visit from 10/13/2019 in [place] at [facility] Visit from 11/19/2017 in [place] at [facility] Visit from 03/08/2017 in [place] at [facility] Visit from 12/25/2016 in [place] at [place]  ?PHQ-2 Total Score 2 0 0 0  ?PHQ-9 Total Score 11 -- -- --  ? ?  ?  ? ?Review of Systems:  ?Review of Systems  ?Musculoskeletal:  Negative for gait problem.  ?Neurological:  Negative for tremors.  ?Psychiatric/Behavioral:    ?     Please refer to HPI  ? ?Medications: I have reviewed the patient's current medications. ? ?Current  Outpatient Medications  ?Medication Sig Dispense Refill  ? ALPRAZolam (XANAX) 1 MG tablet Take 0.5 tablets (0.5 mg total) by mouth 2 (two) times daily. 30 tablet 2  ? estradiol (CLIMARA - DOSED IN MG/24 HR) 0.1 mg/24hr patch Place 1 patch onto the skin once a week.    ? fluorouracil (EFUDEX) 5 % cream Apply 1 application topically 2 (two) times daily.    ? Fremanezumab-vfrm (AJOVY) 225 MG/1.5ML SOAJ Inject 225 mg into the skin every 30 (thirty) days. 1.68 mL 11  ? gabapentin (NEURONTIN) 100 MG capsule Take two capsules three times daily. 540 capsule 3  ? lamoTRIgine (LAMICTAL) 150 MG tablet TAKE 1 TABLET BY MOUTH EVERY DAY 90 tablet 3  ? ondansetron (ZOFRAN) 4 MG tablet Take 1 tablet (4 mg total) by mouth every 8 (eight) hours as needed for nausea or vomiting. 20 tablet 3  ? progesterone (PROMETRIUM) 200 MG capsule Take 1 capsule by mouth at bedtime.    ? propranolol (INDERAL) 60 MG tablet Take 1 tablet (60 mg total) by mouth 2 (two) times daily. 180 tablet 3  ? rizatriptan (MAXALT-MLT) 10 MG disintegrating tablet Take 1 tablet (10 mg total) by mouth as needed for migraine. May repeat in 2 hours if needed 9 tablet 11  ? zolpidem (AMBIEN CR) 12.5 MG CR tablet Take 1 tablet (12.5 mg total) by mouth at bedtime. 30 tablet 2  ? ?No current facility-administered medications for this visit.  ? ? ?Medication Side Effects: None ? ?Allergies:  ?Allergies  ?  Allergen Reactions  ? Pollen Extract Other (See Comments)  ? ? ?Past Medical History:  ?Diagnosis Date  ? Anxiety   ? Basal cell carcinoma (BCC) of face   ? Bell's palsy   ? H/O  ? Bipolar 1 disorder ([place])   ? Depression   ? Headache   ? MIGRAINES  ? History of genital warts   ? ? ?Past Medical History, Surgical history, Social history, and Family history were reviewed and updated as appropriate.  ? ?Please see review of systems for further details on the patient's review from today.  ? ?Objective:  ? ?Physical Exam:  ?There were no vitals taken for this visit. ? ?Physical  Exam ?Constitutional:   ?   General: She is not in acute distress. ?Musculoskeletal:     ?   General: No deformity.  ?Neurological:  ?   Mental Status: She is alert and oriented to person, place, and time.  ?   Coordination: Coordination normal.  ?Psychiatric:     ?   Attention and Perception: Attention and perception normal. She does not perceive auditory or visual hallucinations.     ?   Mood and Affect: Mood normal. Mood is not anxious or depressed. Affect is not labile, blunt, angry or inappropriate.     ?   Speech: Speech normal.     ?   Behavior: Behavior normal.     ?   Thought Content: Thought content normal. Thought content is not paranoid or delusional. Thought content does not include homicidal or suicidal ideation. Thought content does not include homicidal or suicidal plan.     ?   Cognition and Memory: Cognition and memory normal.     ?   Judgment: Judgment normal.  ?   Comments: Insight intact  ? ? ?Lab Review:  ?   ?Component Value Date/Time  ? NA 141 10/13/2019 1302  ? K 3.8 10/13/2019 1302  ? CL 105 10/13/2019 1302  ? CO2 27 10/13/2019 1302  ? GLUCOSE 75 10/13/2019 1302  ? BUN 11 10/13/2019 1302  ? CREATININE 0.99 10/13/2019 1302  ? CALCIUM 9.4 10/13/2019 1302  ? PROT 6.9 10/13/2019 1302  ? ALBUMIN 4.6 10/13/2019 1302  ? AST 13 10/13/2019 1302  ? ALT 14 10/13/2019 1302  ? ALKPHOS 65 10/13/2019 1302  ? BILITOT 0.5 10/13/2019 1302  ? GFRNONAA >60 02/18/2017 1320  ? GFRAA >60 02/18/2017 1320  ? ? ?   ?Component Value Date/Time  ? WBC 6.8 10/13/2019 1302  ? RBC 4.21 10/13/2019 1302  ? HGB 12.9 10/13/2019 1302  ? HCT 38.5 10/13/2019 1302  ? PLT 307.0 10/13/2019 1302  ? MCV 91.5 10/13/2019 1302  ? MCH 30.6 02/18/2017 1320  ? MCHC 33.5 10/13/2019 1302  ? RDW 13.0 10/13/2019 1302  ? ? ?No results found for: POCLITH, LITHIUM  ? ?No results found for: PHENYTOIN, PHENOBARB, VALPROATE, CBMZ  ? ?.res ?Assessment: Plan:   ? ?Assessment: Plan:   ? ?Plan: ? ?1. Ambien CR 12.'5mg'$  at hs ?2. Xanax '1mg'$  - 1/2 tab  BID ?3. Lamictal '150mg'$  at hs ?4. Gabapentin '200mg'$  TID ?5. Add Abilify '5mg'$  daily ? ?RTC 4 weeks ? ?Patient advised to contact office with any questions, adverse effects, or acute worsening in signs and symptoms. ? ?Discussed potential benefits, risk, and side effects of benzodiazepines to include potential risk of tolerance and dependence, as well as possible drowsiness.  Advised patient not to drive if experiencing drowsiness and to take lowest possible effective dose  to minimize risk of dependence and tolerance. ? ?Discussed potential metabolic side effects associated with atypical antipsychotics, as well as potential risk for movement side effects. Advised pt to contact office if movement side effects occur. ?  ?Diagnoses and all orders for this visit: ? ?No diagnosis on Axis I ? ?  ? ?Please see After Visit Summary for patient specific instructions. ? ?Future Appointments  ?Date Time Provider [place]  ?05/30/2021  8:20 AM Magen Suriano, Berdie Ogren, NP [facility] None  ?07/26/2021  2:45 PM Suzzanne Cloud, NP [facility] None  ? ? ?No orders of the defined types were placed in this encounter. ? ? ?------------------------------- ?

## 2021-05-05 NOTE — Addendum Note (Signed)
Addended by: Aloha Gell on: 05/05/2021 10:55 AM ? ? Modules accepted: Orders, Level of Service ? ?

## 2021-05-05 NOTE — Progress Notes (Signed)
Patient no show appointment. ? ?

## 2021-05-30 ENCOUNTER — Other Ambulatory Visit: Payer: Self-pay | Admitting: Adult Health

## 2021-05-30 ENCOUNTER — Ambulatory Visit: Payer: BC Managed Care – PPO | Admitting: Adult Health

## 2021-05-30 DIAGNOSIS — F319 Bipolar disorder, unspecified: Secondary | ICD-10-CM

## 2021-05-30 DIAGNOSIS — F411 Generalized anxiety disorder: Secondary | ICD-10-CM

## 2021-05-31 NOTE — Telephone Encounter (Signed)
Is patient still taking?

## 2021-06-10 ENCOUNTER — Telehealth: Payer: Self-pay | Admitting: Adult Health

## 2021-06-10 DIAGNOSIS — F3130 Bipolar disorder, current episode depressed, mild or moderate severity, unspecified: Secondary | ICD-10-CM | POA: Insufficient documentation

## 2021-06-10 NOTE — Telephone Encounter (Signed)
Pt LVM @ 10:23a.  She said she wanted to tell Barnett Applebaum how she was feeling on [place], a new medicine she started.  She does not feel like it's working and it's exacerbating feelings of hurting herself.  She wants to be taken off of it because it is putting her in a "low spot she does not want to be in". ? ?No upcoming appts scheduled. ?

## 2021-06-10 NOTE — Telephone Encounter (Signed)
Rtc to pt and she reports being on Abilify 5 mg for a couple of months and feels like it is worsening her symptoms, they have gradually increased and has more suicidal ideations, intrusive thoughts. Feels like it is coming from the Abilify and wants to stop it. After discussing with Rollene Fare she advised for her to stop it and call back Monday with an update. I informed pt and she will call us back Monday. ? ?Advised her if having any worsening suicidal ideations to go to [facility] over the weekend and she agreed.  ?

## 2021-06-16 NOTE — Telephone Encounter (Signed)
Can we call and follow up on her please?

## 2021-06-16 NOTE — Telephone Encounter (Signed)
LVM to RC 

## 2021-06-17 NOTE — Telephone Encounter (Signed)
Was able to reach patient. She says she is doing much better since stopping Abilify.

## 2021-06-17 NOTE — Telephone Encounter (Signed)
Noted. Ty!

## 2021-07-08 ENCOUNTER — Ambulatory Visit (INDEPENDENT_AMBULATORY_CARE_PROVIDER_SITE_OTHER): Payer: BC Managed Care – PPO | Admitting: Family Medicine

## 2021-07-08 VITALS — BP 100/70 | HR 74 | Temp 97.6°F | Ht 68.5 in | Wt 205.4 lb

## 2021-07-08 DIAGNOSIS — Z0000 Encounter for general adult medical examination without abnormal findings: Secondary | ICD-10-CM

## 2021-07-08 DIAGNOSIS — M2550 Pain in unspecified joint: Secondary | ICD-10-CM

## 2021-07-08 DIAGNOSIS — E782 Mixed hyperlipidemia: Secondary | ICD-10-CM

## 2021-07-08 DIAGNOSIS — Z1211 Encounter for screening for malignant neoplasm of colon: Secondary | ICD-10-CM

## 2021-07-08 DIAGNOSIS — R7303 Prediabetes: Secondary | ICD-10-CM

## 2021-07-08 LAB — COMPREHENSIVE METABOLIC PANEL
ALT: 10 U/L (ref 0–35)
AST: 12 U/L (ref 0–37)
Albumin: 4.2 g/dL (ref 3.5–5.2)
Alkaline Phosphatase: 60 U/L (ref 39–117)
BUN: 10 mg/dL (ref 6–23)
CO2: 28 mEq/L (ref 19–32)
Calcium: 9.4 mg/dL (ref 8.4–10.5)
Chloride: 106 mEq/L (ref 96–112)
Creatinine, Ser: 0.9 mg/dL (ref 0.40–1.20)
GFR: 77.67 mL/min (ref >60.00)
Glucose, Bld: 76 mg/dL (ref 70–99)
Potassium: 3.8 mEq/L (ref 3.5–5.1)
Sodium: 140 mEq/L (ref 135–145)
Total Bilirubin: 0.4 mg/dL (ref 0.2–1.2)
Total Protein: 6.9 g/dL (ref 6.0–8.3)

## 2021-07-08 LAB — CBC
HCT: 39.6 % (ref 36.0–46.0)
Hemoglobin: 13.2 g/dL (ref 12.0–15.0)
MCHC: 33.4 g/dL (ref 30.0–36.0)
MCV: 92 fl (ref 78.0–100.0)
Platelets: 274 10*3/uL (ref 150.0–400.0)
RBC: 4.3 Mil/uL (ref 3.87–5.11)
RDW: 13.4 % (ref 11.5–15.5)
WBC: 6.5 10*3/uL (ref 4.0–10.5)

## 2021-07-08 LAB — LIPID PANEL
Cholesterol: 172 mg/dL (ref 0–200)
HDL: 51.2 mg/dL (ref >39.00)
LDL Cholesterol: 102 mg/dL — ABNORMAL HIGH (ref 0–99)
NonHDL: 120.72
Total CHOL/HDL Ratio: 3
Triglycerides: 92 mg/dL (ref 0.0–149.0)
VLDL: 18.4 mg/dL (ref 0.0–40.0)

## 2021-07-08 LAB — HEMOGLOBIN A1C: Hgb A1c MFr Bld: 5.4 % (ref 4.6–6.5)

## 2021-07-08 NOTE — Patient Instructions (Signed)
Your screen for diabetes shows that you have prediabetes. This means that you are at risk for developing diabetes.   You can slow the progression to diabetes through healthy diet and exercise.   Eat healthy foods Get at least 150 minutes of moderate aerobic physical activity a week, or about 30 minutes on most days of the week Lose excess weight Control your blood pressure and cholesterol Don't smoke  We should repeat this test next year to monitor for changes.

## 2021-07-08 NOTE — Progress Notes (Signed)
Annual Exam   Chief Complaint:  Chief Complaint  Patient presents with   Annual Exam   Joint Pain    In fingers     History of Present Illness:  [name] is a 46 y.o. No obstetric history on file. who LMP was Patient's last menstrual period was 06/05/2021 (exact date)., presents today for her annual examination.    #joint pain - thumb and index and ring finger on the left hand - also thumb and index on the right hand - will get very cold  - maternal aunt with RA - right handed  - achy pain - no tingling or numbness - located on the PIP joint  Nutrition Diet: could be better, not healthy Exercise: recently joined the gym, volleyball team She does not get adequate calcium and Vitamin D in her diet.   Social History   Tobacco Use  Smoking Status Never  Smokeless Tobacco Never   Social History   Substance and Sexual Activity  Alcohol Use Yes   Alcohol/week: 0.0 standard drinks of alcohol   Comment: 1-2 times a month   Social History   Substance and Sexual Activity  Drug Use No    Safety The patient wears seatbelts: yes.     The patient feels safe at home and in their relationships: yes.  General Health Dentist in the last year: Yes Eye doctor: yes  Menstrual On progesterone and getting them regularly 10 days Goes to [facility]   GYN She is single partner, contraception - none.    Cervical Cancer Screening:   Last Pap:   October 2020 Results were: no abnormalities /neg HPV DNA    Breast Cancer Screening There is no FH of breast cancer. There is no FH of ovarian cancer. BRCA screening Not Indicated.  Discussed that for average risk women between age 8-49 screening may reduce the risk of breast cancer death, however, at a lower rate than those over age 32. And that the the false-positive rates resulting in unnecessary biopsies with more screening is higher. The balance of benefits vs harms likely improves as you progress through your 40s. The  patient does want a mammogram this year.   Colon Cancer Screening:  Age 17-75 yo - benefits outweigh the risk. Adults 20-85 yo who have never been screened benefit.  Benefits: 134000 people in 2016 will be diagnosed and 49,000 will die - early detection helps Harms: Complications 2/2 to colonoscopy High Risk (Colonoscopy): genetic disorder (Lynch syndrome or familial adenomatous polyposis), personal hx of IBD, previous adenomatous polyp, or previous colorectal cancer, FamHx start 10 years before the age at diagnosis, increased in males and black race  Options:  FIT - looks for hemoglobin (blood in the stool) - specific and fairly sensitive - must be done annually Cologuard - looks for DNA and blood - more sensitive - therefore can have more false positives, every 3 years Colonoscopy - every 10 years if normal - sedation, bowl prep, must have someone drive you  Shared decision making and the patient had decided to do colonoscopy.  Weight Wt Readings from Last 3 Encounters:  07/08/21 205 lb 6 oz (93.2 kg)  01/18/21 206 lb 8 oz (93.7 kg)  07/12/20 200 lb (90.7 kg)   Patient has high BMI  BMI Readings from Last 1 Encounters:  07/08/21 30.77 kg/m     Chronic disease screening Blood pressure monitoring:  BP Readings from Last 3 Encounters:  07/08/21 100/70  01/18/21 124/85  07/12/20 106/76    Lipid Monitoring: Indication for screening: age >23, obesity, diabetes, family hx, CV risk factors.  Lipid screening: Yes  Lab Results  Component Value Date   CHOL 171 10/13/2019   HDL 51.40 10/13/2019   LDLCALC 101 (H) 10/13/2019   TRIG 93.0 10/13/2019   CHOLHDL 3 10/13/2019     Diabetes Screening: age >43, overweight, family hx, PCOS, hx of gestational diabetes, at risk ethnicity Diabetes Screening screening: Yes  Lab Results  Component Value Date   HGBA1C 5.6 10/13/2019     Past Medical History:  Diagnosis Date   Anxiety    Basal cell carcinoma (BCC) of face    Bell's  palsy    H/O   Bipolar 1 disorder ([place])    Depression    Headache    MIGRAINES   History of genital warts     Past Surgical History:  Procedure Laterality Date   BREAST BIOPSY Right 07/03/2018   [place] bx           heart marker              path pending   BREAST CYST ASPIRATION Left 2014   by Dr. Tamala Julian   LAPAROSCOPIC TUBAL LIGATION Bilateral 11/10/2016   Procedure: LAPAROSCOPIC TUBAL LIGATION;  Surgeon: Benjaman Kindler, MD;  Location: [facility] ORS;  Service: Gynecology;  Laterality: Bilateral;   MOHS SURGERY     TONSILLECTOMY AND ADENOIDECTOMY     TUBAL LIGATION     tubes in right ear      Prior to Admission medications   Medication Sig Start Date End Date Taking? Authorizing Provider  ALPRAZolam Duanne Moron) 1 MG tablet Take 0.5 tablets (0.5 mg total) by mouth 2 (two) times daily. 05/05/21  Yes Mozingo, Berdie Ogren, NP  ARIPiprazole (ABILIFY) 5 MG tablet Take 1 tablet (5 mg total) by mouth daily. 05/05/21  Yes Mozingo, Berdie Ogren, NP  fluorouracil (EFUDEX) 5 % cream Apply 1 application topically 2 (two) times daily. 12/27/20  Yes [provider]  Fremanezumab-vfrm (AJOVY) 225 MG/1.5ML SOAJ Inject 225 mg into the skin every 30 (thirty) days. 01/18/21  Yes Suzzanne Cloud, NP  lamoTRIgine (LAMICTAL) 150 MG tablet TAKE 1 TABLET BY MOUTH EVERY DAY 10/14/20  Yes Mozingo, Berdie Ogren, NP  ondansetron (ZOFRAN) 4 MG tablet Take 1 tablet (4 mg total) by mouth every 8 (eight) hours as needed for nausea or vomiting. 07/12/20  Yes Suzzanne Cloud, NP  progesterone (PROMETRIUM) 200 MG capsule Take 1 capsule by mouth at bedtime. 12/03/20  Yes [provider]  propranolol (INDERAL) 60 MG tablet Take 1 tablet (60 mg total) by mouth 2 (two) times daily. 01/18/21  Yes Suzzanne Cloud, NP  rizatriptan (MAXALT-MLT) 10 MG disintegrating tablet Take 1 tablet (10 mg total) by mouth as needed for migraine. May repeat in 2 hours if needed 07/12/20  Yes Suzzanne Cloud, NP  zolpidem (AMBIEN CR) 12.5  MG CR tablet Take 1 tablet (12.5 mg total) by mouth at bedtime. 05/05/21  Yes Mozingo, Berdie Ogren, NP    Allergies  Allergen Reactions   Pollen Extract Other (See Comments)    Gynecologic History: Patient's last menstrual period was 06/05/2021 (exact date).  Obstetric History: No obstetric history on file.  Social History   Socioeconomic History   Marital status: Married    Spouse name: Marcello Moores   Number of children: 1   Years of education: Grad school   Highest education level: Not on file  Occupational  History   Not on file  Tobacco Use   Smoking status: Never   Smokeless tobacco: Never  Vaping Use   Vaping Use: Never used  Substance and Sexual Activity   Alcohol use: Yes    Alcohol/week: 0.0 standard drinks of alcohol    Comment: 1-2 times a month   Drug use: No   Sexual activity: Yes    Birth control/protection: Surgical  Other Topics Concern   Not on file  Social History Narrative   10/13/19   From: [place]   Living: with husband, Marcello Moores [place]) - 2005   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      Family: Network engineer (2008)      Enjoys: watch TV - [place] Drama      Exercise: not currently   Diet: not great - meat - starch veggie, fast food - twice daily      Safety   Seat belts: Yes    Guns: Yes  and secure   Safe in relationships: Yes    Social Determinants of Health   Financial Resource Strain: Not on file  Food Insecurity: Not on file  Transportation Needs: Not on file  Physical Activity: Not on file  Stress: Not on file  Social Connections: Not on file  Intimate Partner Violence: Not on file    Family History  Problem Relation Age of Onset   Diabetes Maternal Grandfather    Breast cancer Paternal Grandmother 5   Colon cancer Paternal Grandfather 9   Pancreatic cancer Paternal Grandfather    Depression Mother    Depression Father    Lung cancer Maternal Aunt     Review of Systems  Constitutional:  Negative for chills and fever.   HENT:  Negative for congestion and sore throat.   Eyes:  Negative for blurred vision and double vision.  Respiratory:  Negative for shortness of breath.   Cardiovascular:  Negative for chest pain.  Gastrointestinal:  Negative for heartburn, nausea and vomiting.  Genitourinary: Negative.   Musculoskeletal:  Positive for joint pain. Negative for myalgias.  Skin:  Negative for rash.  Neurological:  Negative for dizziness and headaches.  Endo/Heme/Allergies:  Does not bruise/bleed easily.  Psychiatric/Behavioral:  Negative for depression. The patient is not nervous/anxious.      Physical Exam BP 100/70   Pulse 74   Temp 97.6 F (36.4 C) (Temporal)   Ht 5' 8.5" (1.74 m)   Wt 205 lb 6 oz (93.2 kg)   LMP 06/05/2021 (Exact Date)   SpO2 97%   BMI 30.77 kg/m    BP Readings from Last 3 Encounters:  07/08/21 100/70  01/18/21 124/85  07/12/20 106/76      Physical Exam Constitutional:      General: She is not in acute distress.    Appearance: She is well-developed. She is not diaphoretic.  HENT:     Head: Normocephalic and atraumatic.     Right Ear: External ear normal.     Left Ear: External ear normal.     Nose: Nose normal.  Eyes:     General: No scleral icterus.    Extraocular Movements: Extraocular movements intact.     Conjunctiva/sclera: Conjunctivae normal.  Cardiovascular:     Rate and Rhythm: Normal rate and regular rhythm.     Heart sounds: No murmur heard. Pulmonary:     Effort: Pulmonary effort is normal. No respiratory distress.     Breath sounds: Normal breath sounds. No wheezing.  Abdominal:  General: Bowel sounds are normal. There is no distension.     Palpations: Abdomen is soft. There is no mass.     Tenderness: There is no abdominal tenderness. There is no guarding or rebound.  Musculoskeletal:        General: Normal range of motion.     Cervical back: Neck supple.     Comments: Hands  Inspection: no swelling Strength:normal ROM:  normal Palpation: ttp along the first MCP joints b/l Grind testing of MCP positive b/l  Lymphadenopathy:     Cervical: No cervical adenopathy.  Skin:    General: Skin is warm and dry.     Capillary Refill: Capillary refill takes less than 2 seconds.  Neurological:     Mental Status: She is alert and oriented to person, place, and time.     Deep Tendon Reflexes: Reflexes normal.  Psychiatric:        Mood and Affect: Mood normal.        Behavior: Behavior normal.      Results:  PHQ-9:  [place] Office Visit from 07/08/2021 in [place] at [place]  PHQ-9 Total Score 12         Assessment: 46 y.o. No obstetric history on file. female here for routine annual physical examination.  Plan: Problem List Items Addressed This Visit       Other   Prediabetes   Relevant Orders   Hemoglobin A1c   Mixed hyperlipidemia   Relevant Orders   Comprehensive metabolic panel   Lipid panel   Polyarthralgia    Family history of RA, discussed getting labs.  She does have pain on bilateral 1st MCP.  Discussed trying a brace and Voltaren gel.      Relevant Orders   ANA w/Reflex   Rheumatoid factor   CBC   Other Visit Diagnoses     Annual physical exam    -  Primary   Screening for colon cancer       Relevant Orders   Ambulatory referral to Gastroenterology       Screening: -- Blood pressure screen normal -- cholesterol screening: will obtain -- Weight screening: overweight: continue to monitor -- Diabetes Screening: will obtain -- Nutrition: Encouraged healthy diet  The 10-year ASCVD risk score (Arnett DK, et al., 2019) is: 0.4%   Values used to calculate the score:     Age: 4 years     Sex: Female     Is Non-Hispanic African American: No     Diabetic: No     Tobacco smoker: No     Systolic Blood Pressure: 035 mmHg     Is BP treated: No     HDL Cholesterol: 51.4 mg/dL     Total Cholesterol: 171 mg/dL  -- Statin therapy for Age 36-75 with CVD risk  >7.5%  Psych -- Depression screening (PHQ-9):  [place] Office Visit from 07/08/2021 in [place] at [place]  PHQ-9 Total Score 12        Safety -- tobacco screening: not using -- alcohol screening:  low-risk usage. -- no evidence of domestic violence or intimate partner violence.   Cancer Screening -- pap smear not collected per ASCCP guidelines -- family history of breast cancer screening: done. not at high risk. -- Mammogram -  up to date -- Colon cancer (age 79+)-- ordered  Immunizations Immunization History  Administered Date(s) Administered   Influenza,inj,Quad PF,6+ Mos 10/31/2016, 11/06/2017, 11/06/2018   Influenza,inj,quad, With Preservative 10/25/2015    --  flu vaccine up to date -- TDAP q10 years not up to date - declined -- -- Covid-19 Vaccine declined   Encouraged healthy diet and exercise. Encouraged regular vision and dental care.   Lesleigh Noe, MD

## 2021-07-08 NOTE — Assessment & Plan Note (Signed)
Family history of RA, discussed getting labs.  She does have pain on bilateral 1st MCP.  Discussed trying a brace and Voltaren gel.

## 2021-07-09 LAB — ANA W/REFLEX: Anti Nuclear Antibody (ANA): NEGATIVE

## 2021-07-11 ENCOUNTER — Encounter: Payer: Self-pay | Admitting: Family Medicine

## 2021-07-11 DIAGNOSIS — R202 Paresthesia of skin: Secondary | ICD-10-CM

## 2021-07-11 DIAGNOSIS — R7989 Other specified abnormal findings of blood chemistry: Secondary | ICD-10-CM

## 2021-07-15 ENCOUNTER — Other Ambulatory Visit: Payer: Self-pay | Admitting: Neurology

## 2021-07-26 ENCOUNTER — Ambulatory Visit: Payer: BC Managed Care – PPO | Admitting: Neurology

## 2021-08-04 ENCOUNTER — Encounter: Payer: Self-pay | Admitting: Family Medicine

## 2021-09-15 DIAGNOSIS — M25572 Pain in left ankle and joints of left foot: Secondary | ICD-10-CM | POA: Insufficient documentation

## 2021-09-16 ENCOUNTER — Encounter: Payer: Self-pay | Admitting: Adult Health

## 2021-09-16 ENCOUNTER — Ambulatory Visit: Payer: BC Managed Care – PPO | Admitting: Adult Health

## 2021-09-16 DIAGNOSIS — F411 Generalized anxiety disorder: Secondary | ICD-10-CM | POA: Diagnosis not present

## 2021-09-16 DIAGNOSIS — G4700 Insomnia, unspecified: Secondary | ICD-10-CM | POA: Diagnosis not present

## 2021-09-16 DIAGNOSIS — F331 Major depressive disorder, recurrent, moderate: Secondary | ICD-10-CM | POA: Diagnosis not present

## 2021-09-16 DIAGNOSIS — F319 Bipolar disorder, unspecified: Secondary | ICD-10-CM

## 2021-09-16 NOTE — Progress Notes (Signed)
Brittany Davila [id number] 08-Jul-1975 46 y.o.  Subjective:   Patient ID:  Brittany Davila is a 46 y.o. (DOB November 05, 1975) female.  Chief Complaint: No chief complaint on file.   HPI [place] presents to the office today for follow-up of GAD, MDD, insomnia, and BPD.   Describes mood today as "ok". Pleasant. Mood symptoms - reports anxiety, depression, and irritability. Mood has been lower. Stating "I'm not doing to good". Stopped the Abilify due to suicidal thinking but still feels like she needs medication for the depression. Unable to identify a precipitant for mood decline. Decreased interest and motivation. Take medications as prescribed.  Energy levels decreased. Active, does not a regular exercise routine.  Enjoys some usual interests and activities. Married. Lives with husband and old daughter. Spending time with family. Appetite adequate. Weight gain 201 pounds.. Sleeping difficulties. Averages 3 to 4 hours a night. Focus and concentration difficulties over the past week. Completing tasks. Managing aspects of household. 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  Denies AH or VH.   [place] Office Visit from 07/08/2021 in [place] at [facility] Visit from 10/13/2019 in [place] at [facility]  Total GAD-7 Score 12 7      PHQ2-9    [place] Visit from 07/08/2021 in [place] at [facility] Visit from 10/13/2019 in [place] at [facility] Visit from 11/19/2017 in [place] at [facility] Visit from 03/08/2017 in [place] at [facility] Visit from 12/25/2016 in [place] at [facility]  PHQ-2 Total Score 2 2 0 0 0  PHQ-9 Total Score 12 11 -- -- --        Review of Systems:  Review of Systems  Musculoskeletal:  Negative for gait problem.  Neurological:  Negative for tremors.  Psychiatric/Behavioral:         Please refer to HPI    Medications:  I have reviewed the patient's current medications.  Current Outpatient Medications  Medication Sig Dispense Refill   ALPRAZolam (XANAX) 1 MG tablet Take 0.5 tablets (0.5 mg total) by mouth 2 (two) times daily. 30 tablet 2   ARIPiprazole (ABILIFY) 5 MG tablet Take 1 tablet (5 mg total) by mouth daily. 30 tablet 2   fluorouracil (EFUDEX) 5 % cream Apply 1 application topically 2 (two) times daily.     Fremanezumab-vfrm (AJOVY) 225 MG/1.5ML SOAJ Inject 225 mg into the skin every 30 (thirty) days. 1.68 mL 11   gabapentin (NEURONTIN) 100 MG capsule Take 200 mg by mouth 3 (three) times daily.     lamoTRIgine (LAMICTAL) 150 MG tablet TAKE 1 TABLET BY MOUTH EVERY DAY 90 tablet 3   ondansetron (ZOFRAN) 4 MG tablet Take 1 tablet (4 mg total) by mouth every 8 (eight) hours as needed for nausea or vomiting. 20 tablet 3   progesterone (PROMETRIUM) 200 MG capsule Take 1 capsule by mouth at bedtime.     propranolol (INDERAL) 60 MG tablet Take 1 tablet (60 mg total) by mouth 2 (two) times daily. 180 tablet 3   rizatriptan (MAXALT-MLT) 10 MG disintegrating tablet Take 1 tablet (10 mg total) by mouth as needed for migraine. May repeat in 2 hours if needed 9 tablet 11   zolpidem (AMBIEN CR) 12.5 MG CR tablet Take 1 tablet (12.5 mg total) by mouth at bedtime. 30 tablet 2   No current facility-administered medications for this visit.  Medication Side Effects: None  Allergies:  Allergies  Allergen Reactions   Pollen Extract Other (See Comments)    Past Medical History:  Diagnosis Date   Anxiety    Basal cell carcinoma (BCC) of face    Bell's palsy    H/O   Bipolar 1 disorder (HCC)    Depression    Headache    MIGRAINES   History of genital warts     Past Medical History, Surgical history, Social history, and Family history were reviewed and updated as appropriate.   Please see review of systems for further details on the patient's review from today.   Objective:   Physical Exam:  There  were no vitals taken for this visit.  Physical Exam Constitutional:      General: She is not in acute distress. Musculoskeletal:        General: No deformity.  Neurological:     Mental Status: She is alert and oriented to person, place, and time.     Coordination: Coordination normal.  Psychiatric:        Attention and Perception: Attention and perception normal. She does not perceive auditory or visual hallucinations.        Mood and Affect: Mood normal. Mood is not anxious or depressed. Affect is not labile, blunt, angry or inappropriate.        Speech: Speech normal.        Behavior: Behavior normal.        Thought Content: Thought content normal. Thought content is not paranoid or delusional. Thought content does not include homicidal or suicidal ideation. Thought content does not include homicidal or suicidal plan.        Cognition and Memory: Cognition and memory normal.        Judgment: Judgment normal.     Comments: Insight intact     Lab Review:     Component Value Date/Time   NA 140 07/08/2021 0904   K 3.8 07/08/2021 0904   CL 106 07/08/2021 0904   CO2 28 07/08/2021 0904   GLUCOSE 76 07/08/2021 0904   BUN 10 07/08/2021 0904   CREATININE 0.90 07/08/2021 0904   CALCIUM 9.4 07/08/2021 0904   PROT 6.9 07/08/2021 0904   ALBUMIN 4.2 07/08/2021 0904   AST 12 07/08/2021 0904   ALT 10 07/08/2021 0904   ALKPHOS 60 07/08/2021 0904   BILITOT 0.4 07/08/2021 0904   GFRNONAA >60 02/18/2017 1320   GFRAA >60 02/18/2017 1320       Component Value Date/Time   WBC 6.5 07/08/2021 0904   RBC 4.30 07/08/2021 0904   HGB 13.2 07/08/2021 0904   HCT 39.6 07/08/2021 0904   PLT 274.0 07/08/2021 0904   MCV 92.0 07/08/2021 0904   MCH 30.6 02/18/2017 1320   MCHC 33.4 07/08/2021 0904   RDW 13.4 07/08/2021 0904    No results found for: "POCLITH", "LITHIUM"   No results found for: "PHENYTOIN", "PHENOBARB", "VALPROATE", "CBMZ"   .res Assessment: Plan:    Plan:  1. Ambien CR  12.'5mg'$  at hs 2. Xanax '1mg'$  - 1/2 tab BID 3. Lamictal '150mg'$  at hs 4. Gabapentin '200mg'$  TID 5. Add Rexulti 0.'5mg'$  daily - samples given - 3 weeks - will call and advise how doing.  RTC 4 weeks  Patient advised to contact office with any questions, adverse effects, or acute worsening in signs and symptoms.  Discussed potential benefits, risk, and side effects of benzodiazepines to include potential risk of tolerance and dependence, as well as  possible drowsiness.  Advised patient not to drive if experiencing drowsiness and to take lowest possible effective dose to minimize risk of dependence and tolerance.  Discussed potential metabolic side effects associated with atypical antipsychotics, as well as potential risk for movement side effects. Advised pt to contact office if movement side effects occur. Diagnoses and all orders for this visit:  Bipolar 1 disorder ([place])  Major depressive disorder, recurrent episode, moderate (HCC)  Generalized anxiety disorder  Insomnia, unspecified type     Please see After Visit Summary for patient specific instructions.  Future Appointments  Date Time Provider [place]  10/14/2021  8:40 AM Nickola Lenig, Berdie Ogren, NP CP-CP None     No orders of the defined types were placed in this encounter.   -------------------------------

## 2021-09-26 ENCOUNTER — Other Ambulatory Visit (INDEPENDENT_AMBULATORY_CARE_PROVIDER_SITE_OTHER): Payer: BC Managed Care – PPO

## 2021-09-26 ENCOUNTER — Telehealth: Payer: Self-pay | Admitting: Family Medicine

## 2021-09-26 DIAGNOSIS — R7989 Other specified abnormal findings of blood chemistry: Secondary | ICD-10-CM

## 2021-09-26 DIAGNOSIS — R202 Paresthesia of skin: Secondary | ICD-10-CM

## 2021-09-26 DIAGNOSIS — M2550 Pain in unspecified joint: Secondary | ICD-10-CM

## 2021-09-26 DIAGNOSIS — E559 Vitamin D deficiency, unspecified: Secondary | ICD-10-CM | POA: Diagnosis not present

## 2021-09-26 LAB — VITAMIN D 25 HYDROXY (VIT D DEFICIENCY, FRACTURES): VITD: 42.51 ng/mL (ref 30.00–100.00)

## 2021-09-26 LAB — VITAMIN B12: Vitamin B-12: 129 pg/mL — ABNORMAL LOW (ref 211–911)

## 2021-09-26 NOTE — Telephone Encounter (Signed)
Pt called in wants to know status of referral for colonoscopy . Please advise # [phone]

## 2021-09-27 LAB — RHEUMATOID FACTOR: Rheumatoid fact SerPl-aCnc: <14 IU/mL (ref <14)

## 2021-09-28 ENCOUNTER — Other Ambulatory Visit: Payer: Self-pay

## 2021-09-28 ENCOUNTER — Telehealth: Payer: Self-pay | Admitting: Adult Health

## 2021-09-28 MED ORDER — REXULTI 0.5 MG PO TABS: 0.5000 mg | ORAL_TABLET | Freq: Every day | ORAL | 0 refills | Status: DC

## 2021-09-28 NOTE — Telephone Encounter (Signed)
Ok to send

## 2021-09-28 NOTE — Telephone Encounter (Signed)
Yes

## 2021-09-28 NOTE — Telephone Encounter (Signed)
Rx sent 

## 2021-09-28 NOTE — Telephone Encounter (Signed)
Diamonds called this morning to report that the [facility] is working for her.  She asks that you send in a prescription to [facility] [address] in [place], [place].  She will download the coupon to take with her to the pharmacy. Appt 9/15

## 2021-09-30 ENCOUNTER — Ambulatory Visit (INDEPENDENT_AMBULATORY_CARE_PROVIDER_SITE_OTHER): Payer: BC Managed Care – PPO

## 2021-09-30 DIAGNOSIS — E538 Deficiency of other specified B group vitamins: Secondary | ICD-10-CM | POA: Diagnosis not present

## 2021-09-30 MED ORDER — CYANOCOBALAMIN 1000 MCG/ML IJ SOLN
1000.0000 ug | Freq: Once | INTRAMUSCULAR | Status: AC
  Administered 2021-09-30: 1000 ug via INTRAMUSCULAR

## 2021-09-30 NOTE — Progress Notes (Signed)
Per orders of [name] NP, injection of B-12 given by Francella Solian in left deltoid. Patient tolerated injection well. Patient will make appointment for 1 month.

## 2021-10-04 ENCOUNTER — Telehealth: Payer: Self-pay

## 2021-10-04 NOTE — Telephone Encounter (Signed)
Prior Authorization submitted for Rexulti 0.5 mg with [employer], pending response

## 2021-10-04 NOTE — Telephone Encounter (Signed)
Prior Approval received for REXULTI 0.5 MG effective 10/04/2021-10/04/2024 with [facility]

## 2021-10-05 NOTE — Telephone Encounter (Signed)
Should we give samples or have her find another pharmacy?

## 2021-10-05 NOTE — Telephone Encounter (Signed)
Both

## 2021-10-05 NOTE — Telephone Encounter (Signed)
Pt LVM @ 8:47a.  She said the pharmacy doesn't have the medicine in stock.  She wants to know if she can get more samples from here until they do.  Next appt 9/15

## 2021-10-05 NOTE — Telephone Encounter (Signed)
Pt will come get samples and find another pharmacy

## 2021-10-07 NOTE — Telephone Encounter (Signed)
Patient called about her colonoscopy referral. Wanted to know when it would be scheduled.

## 2021-10-10 NOTE — Telephone Encounter (Addendum)
I do not schedule Colonoscopies for patients. They are responsible for scheduling these appts.  Her referral was faxed over 07/08/21 to [facility] per her request. She will need to call them to coordinate.   Mychart message sent to patient.  Information for Referral #: [id number]   Diagnoses:   Z12.11 (ICD-10-CM) - Screening for colon cancer   Procedures: [id number] - AMB REFERRAL TO GASTROENTEROLOGY Authorization #:    Referring Provider [place], [place], MD [address] [place],  [place] [address] [phone]   Referring To Provider Information [facility] [place] [address] [phone]   Referral Start Date: 07/08/2021 Referral End Date: 07/08/2022

## 2021-10-14 ENCOUNTER — Ambulatory Visit (INDEPENDENT_AMBULATORY_CARE_PROVIDER_SITE_OTHER): Payer: BC Managed Care – PPO | Admitting: Adult Health

## 2021-10-14 ENCOUNTER — Encounter: Payer: Self-pay | Admitting: Adult Health

## 2021-10-14 DIAGNOSIS — F411 Generalized anxiety disorder: Secondary | ICD-10-CM | POA: Diagnosis not present

## 2021-10-14 DIAGNOSIS — G4700 Insomnia, unspecified: Secondary | ICD-10-CM

## 2021-10-14 DIAGNOSIS — F331 Major depressive disorder, recurrent, moderate: Secondary | ICD-10-CM | POA: Diagnosis not present

## 2021-10-14 DIAGNOSIS — F319 Bipolar disorder, unspecified: Secondary | ICD-10-CM

## 2021-10-14 MED ORDER — GABAPENTIN 100 MG PO CAPS: 200.0000 mg | ORAL_CAPSULE | Freq: Three times a day (TID) | ORAL | 5 refills | Status: DC

## 2021-10-14 MED ORDER — LAMOTRIGINE 150 MG PO TABS: ORAL_TABLET | ORAL | 3 refills | Status: DC

## 2021-10-14 MED ORDER — BREXPIPRAZOLE 1 MG PO TABS: 1.0000 mg | ORAL_TABLET | Freq: Every day | ORAL | 5 refills | Status: DC

## 2021-10-14 MED ORDER — ALPRAZOLAM 1 MG PO TABS: 0.5000 mg | ORAL_TABLET | Freq: Two times a day (BID) | ORAL | 2 refills | Status: DC

## 2021-10-14 MED ORDER — ZOLPIDEM TARTRATE ER 12.5 MG PO TBCR: 12.5000 mg | EXTENDED_RELEASE_TABLET | Freq: Every day | ORAL | 2 refills | Status: DC

## 2021-10-14 NOTE — Progress Notes (Addendum)
Brittany Davila [id number] [birth year] 46 y.o.  Subjective:   Patient ID:  Brittany Davila is a 46 y.o. (DOB 1975-09-18) female.  Chief Complaint: No chief complaint on file.   HPI [place] presents to the office today for follow-up of GAD, MDD, insomnia, and BPD.   Describes mood today as "ok". Pleasant. Mood symptoms - denies depression and irritability. Reports increased anxiety at times - usually happens when I over think. Reports panic attacks - weekly - taking Xanax as needed to help manage symptoms. Mood is variable - depends on the day. Stating "I'm doing alright". Stopped the Abilify due to suicidal thinking but still feels like she needs medication for the depression. Unable to identify a precipitant for mood decline. Decreased interest and motivation. Take medications as prescribed.  Energy levels lower. Active, does not a regular exercise routine.  Enjoys some usual interests and activities. Married. Lives with husband and old daughter. Spending time with family. Appetite adequate. Weight gain 201 to 215 pounds.. Sleeping difficulties. Averages 5 to 6 hours a night. Focus and concentration difficulties - related to sleep issues. Completing tasks. Managing aspects of household. 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  Denies AH or VH.    [employer] Office Visit from 07/08/2021 in [place] at [facility] Visit from 10/13/2019 in [place] at [facility]  Total GAD-7 Score 12 7      PHQ2-9    [place] Visit from 07/08/2021 in [place] at [facility] Visit from 10/13/2019 in [place] at [facility] Visit from 11/19/2017 in [place] at [facility] Visit from 03/08/2017 in [place] at [facility] Visit from 12/25/2016 in [place] at [facility]  PHQ-2 Total Score 2 2 0 0 0  PHQ-9 Total Score 12 11 -- -- --        Review of Systems:  Review of  Systems  Musculoskeletal:  Negative for gait problem.  Neurological:  Negative for tremors.  Psychiatric/Behavioral:         Please refer to HPI    Medications: I have reviewed the patient's current medications.  Current Outpatient Medications  Medication Sig Dispense Refill   brexpiprazole (REXULTI) 1 MG TABS tablet Take 1 tablet (1 mg total) by mouth daily. 30 tablet 5   ALPRAZolam (XANAX) 1 MG tablet Take 0.5 tablets (0.5 mg total) by mouth 2 (two) times daily. 30 tablet 2   fluorouracil (EFUDEX) 5 % cream Apply 1 application topically 2 (two) times daily.     Fremanezumab-vfrm (AJOVY) 225 MG/1.5ML SOAJ Inject 225 mg into the skin every 30 (thirty) days. 1.68 mL 11   gabapentin (NEURONTIN) 100 MG capsule Take 2 capsules (200 mg total) by mouth 3 (three) times daily. 180 capsule 5   lamoTRIgine (LAMICTAL) 150 MG tablet TAKE 1 TABLET BY MOUTH EVERY DAY 90 tablet 3   ondansetron (ZOFRAN) 4 MG tablet Take 1 tablet (4 mg total) by mouth every 8 (eight) hours as needed for nausea or vomiting. 20 tablet 3   progesterone (PROMETRIUM) 200 MG capsule Take 1 capsule by mouth at bedtime.     propranolol (INDERAL) 60 MG tablet Take 1 tablet (60 mg total) by mouth 2 (two) times daily. 180 tablet 3   rizatriptan (MAXALT-MLT) 10 MG disintegrating tablet Take 1 tablet (10 mg total) by mouth as needed for migraine. May repeat in 2 hours if needed 9  tablet 11   zolpidem (AMBIEN CR) 12.5 MG CR tablet Take 1 tablet (12.5 mg total) by mouth at bedtime. 30 tablet 2   No current facility-administered medications for this visit.    Medication Side Effects: None  Allergies:  Allergies  Allergen Reactions   Pollen Extract Other (See Comments)    Past Medical History:  Diagnosis Date   Anxiety    Basal cell carcinoma (BCC) of face    Bell's palsy    H/O   Bipolar 1 disorder (HCC)    Depression    Headache    MIGRAINES   History of genital warts     Past Medical History, Surgical history, Social  history, and Family history were reviewed and updated as appropriate.   Please see review of systems for further details on the patient's review from today.   Objective:   Physical Exam:  There were no vitals taken for this visit.  Physical Exam Constitutional:      General: She is not in acute distress. Musculoskeletal:        General: No deformity.  Neurological:     Mental Status: She is alert and oriented to person, place, and time.     Coordination: Coordination normal.  Psychiatric:        Attention and Perception: Attention and perception normal. She does not perceive auditory or visual hallucinations.        Mood and Affect: Mood normal. Mood is not anxious or depressed. Affect is not labile, blunt, angry or inappropriate.        Speech: Speech normal.        Behavior: Behavior normal.        Thought Content: Thought content normal. Thought content is not paranoid or delusional. Thought content does not include homicidal or suicidal ideation. Thought content does not include homicidal or suicidal plan.        Cognition and Memory: Cognition and memory normal.        Judgment: Judgment normal.     Comments: Insight intact     Lab Review:     Component Value Date/Time   NA 140 07/08/2021 0904   K 3.8 07/08/2021 0904   CL 106 07/08/2021 0904   CO2 28 07/08/2021 0904   GLUCOSE 76 07/08/2021 0904   BUN 10 07/08/2021 0904   CREATININE 0.90 07/08/2021 0904   CALCIUM 9.4 07/08/2021 0904   PROT 6.9 07/08/2021 0904   ALBUMIN 4.2 07/08/2021 0904   AST 12 07/08/2021 0904   ALT 10 07/08/2021 0904   ALKPHOS 60 07/08/2021 0904   BILITOT 0.4 07/08/2021 0904   GFRNONAA >60 02/18/2017 1320   GFRAA >60 02/18/2017 1320       Component Value Date/Time   WBC 6.5 07/08/2021 0904   RBC 4.30 07/08/2021 0904   HGB 13.2 07/08/2021 0904   HCT 39.6 07/08/2021 0904   PLT 274.0 07/08/2021 0904   MCV 92.0 07/08/2021 0904   MCH 30.6 02/18/2017 1320   MCHC 33.4 07/08/2021 0904   RDW  13.4 07/08/2021 0904    No results found for: "POCLITH", "LITHIUM"   No results found for: "PHENYTOIN", "PHENOBARB", "VALPROATE", "CBMZ"   .res Assessment: Plan:    Plan:  1. Ambien CR 12.'5mg'$  at hs 2. Xanax '1mg'$  - 1/2 tab BID 3. Lamictal '150mg'$  at hs 4. Gabapentin '200mg'$  TID 5. Increase Rexulti 0.'5mg'$  to '1mg'$  daily  RTC 6 months  Patient advised to contact office with any questions, adverse effects, or acute worsening  in signs and symptoms.  Discussed potential benefits, risk, and side effects of benzodiazepines to include potential risk of tolerance and dependence, as well as possible drowsiness.  Advised patient not to drive if experiencing drowsiness and to take lowest possible effective dose to minimize risk of dependence and tolerance.  Discussed potential metabolic side effects associated with atypical antipsychotics, as well as potential risk for movement side effects. Advised pt to contact office if movement side effects occur.   Diagnoses and all orders for this visit:  Bipolar 1 disorder ([place]) -     lamoTRIgine (LAMICTAL) 150 MG tablet; TAKE 1 TABLET BY MOUTH EVERY DAY  Generalized anxiety disorder -     ALPRAZolam (XANAX) 1 MG tablet; Take 0.5 tablets (0.5 mg total) by mouth 2 (two) times daily. -     gabapentin (NEURONTIN) 100 MG capsule; Take 2 capsules (200 mg total) by mouth 3 (three) times daily.  Insomnia, unspecified type -     zolpidem (AMBIEN CR) 12.5 MG CR tablet; Take 1 tablet (12.5 mg total) by mouth at bedtime.  Major depressive disorder, recurrent episode, moderate (HCC) -     brexpiprazole (REXULTI) 1 MG TABS tablet; Take 1 tablet (1 mg total) by mouth daily. -     lamoTRIgine (LAMICTAL) 150 MG tablet; TAKE 1 TABLET BY MOUTH EVERY DAY     Please see After Visit Summary for patient specific instructions.  Future Appointments  Date Time Provider [place]  11/09/2021  3:45 PM [facility] NURSE [facility]  12/06/2021  3:20 PM Dug[place], Lawerance Bach,  FNP [facility]    No orders of the defined types were placed in this encounter.   -------------------------------

## 2021-11-09 ENCOUNTER — Ambulatory Visit (INDEPENDENT_AMBULATORY_CARE_PROVIDER_SITE_OTHER): Payer: BC Managed Care – PPO

## 2021-11-09 DIAGNOSIS — E538 Deficiency of other specified B group vitamins: Secondary | ICD-10-CM

## 2021-11-09 MED ORDER — CYANOCOBALAMIN 1000 MCG/ML IJ SOLN
1000.0000 ug | Freq: Once | INTRAMUSCULAR | Status: AC
  Administered 2021-11-09: 1000 ug via INTRAMUSCULAR

## 2021-11-09 NOTE — Progress Notes (Signed)
Per orders of Allie Bossier, AGNP-C, injection of B-12 given by Barkley Bruns in left deltoid. Patient tolerated injection well.

## 2021-12-06 ENCOUNTER — Encounter: Payer: BC Managed Care – PPO | Admitting: Family

## 2022-02-01 ENCOUNTER — Other Ambulatory Visit: Payer: Self-pay | Admitting: Adult Health

## 2022-02-01 DIAGNOSIS — F411 Generalized anxiety disorder: Secondary | ICD-10-CM

## 2022-02-01 DIAGNOSIS — G4700 Insomnia, unspecified: Secondary | ICD-10-CM

## 2022-02-02 NOTE — Telephone Encounter (Signed)
Filled 11/25 and 11/29 appt 3/15

## 2022-02-20 ENCOUNTER — Encounter: Payer: Self-pay | Admitting: Family

## 2022-02-20 ENCOUNTER — Ambulatory Visit: Payer: BC Managed Care – PPO | Admitting: Family

## 2022-02-20 VITALS — BP 124/82 | HR 85 | Temp 98.3°F | Ht 68.5 in | Wt 207.6 lb

## 2022-02-20 DIAGNOSIS — E538 Deficiency of other specified B group vitamins: Secondary | ICD-10-CM | POA: Diagnosis not present

## 2022-02-20 DIAGNOSIS — E559 Vitamin D deficiency, unspecified: Secondary | ICD-10-CM | POA: Diagnosis not present

## 2022-02-20 DIAGNOSIS — F319 Bipolar disorder, unspecified: Secondary | ICD-10-CM

## 2022-02-20 DIAGNOSIS — R61 Generalized hyperhidrosis: Secondary | ICD-10-CM | POA: Diagnosis not present

## 2022-02-20 DIAGNOSIS — N951 Menopausal and female climacteric states: Secondary | ICD-10-CM | POA: Diagnosis not present

## 2022-02-20 DIAGNOSIS — E782 Mixed hyperlipidemia: Secondary | ICD-10-CM

## 2022-02-20 DIAGNOSIS — G4700 Insomnia, unspecified: Secondary | ICD-10-CM

## 2022-02-20 DIAGNOSIS — G43109 Migraine with aura, not intractable, without status migrainosus: Secondary | ICD-10-CM

## 2022-02-20 LAB — B12 AND FOLATE PANEL
Folate: 6.6 ng/mL (ref >5.9)
Vitamin B-12: 225 pg/mL (ref 211–911)

## 2022-02-20 LAB — CBC WITH DIFFERENTIAL/PLATELET
Basophils Absolute: 0 10*3/uL (ref 0.0–0.1)
Basophils Relative: 0.7 % (ref 0.0–3.0)
Eosinophils Absolute: 0.1 10*3/uL (ref 0.0–0.7)
Eosinophils Relative: 2.5 % (ref 0.0–5.0)
HCT: 43.5 % (ref 36.0–46.0)
Hemoglobin: 14.3 g/dL (ref 12.0–15.0)
Lymphocytes Relative: 38.9 % (ref 12.0–46.0)
Lymphs Abs: 2.2 10*3/uL (ref 0.7–4.0)
MCHC: 32.8 g/dL (ref 30.0–36.0)
MCV: 90.9 fl (ref 78.0–100.0)
Monocytes Absolute: 0.5 10*3/uL (ref 0.1–1.0)
Monocytes Relative: 8.8 % (ref 3.0–12.0)
Neutro Abs: 2.7 10*3/uL (ref 1.4–7.7)
Neutrophils Relative %: 49.1 % (ref 43.0–77.0)
Platelets: 311 10*3/uL (ref 150.0–400.0)
RBC: 4.79 Mil/uL (ref 3.87–5.11)
RDW: 13.1 % (ref 11.5–15.5)
WBC: 5.6 10*3/uL (ref 4.0–10.5)

## 2022-02-20 LAB — FOLLICLE STIMULATING HORMONE: FSH: 109.9 m[IU]/mL

## 2022-02-20 LAB — TSH: TSH: 2.8 u[IU]/mL (ref 0.35–5.50)

## 2022-02-20 LAB — VITAMIN D 25 HYDROXY (VIT D DEFICIENCY, FRACTURES): VITD: 40.65 ng/mL (ref 30.00–100.00)

## 2022-02-20 NOTE — Progress Notes (Signed)
Established Patient Office Visit  Subjective:  Patient ID: Brittany Davila, female    DOB: 10-14-1975  Age: 47 y.o. MRN: [id number]  CC:  Chief Complaint  Patient presents with   Establish Care    HPI Brittany Davila is here today for transfer of care.  Prior provider: Dr. Waunita Schooner    Pt is with acute concerns.  Skin cancer, sees dermatology, Dr. Margarita Grizzle, once yearly. Uses efudex 5% cream prn.   Bipolar 1 disorder, sees Scientist, research (life sciences).   Sleep disorder: takes ambien at night to sleep, doesn't take every night but when needed.   Migraines: decreased frequency lately, usually twice a month. Maxalt helpful if she can catch it in time. Will get nausea and vomit,  but not lately.   Tube right ear, with eustacian tube dysfunction. Sees ENT prn.   Concerned with hot flashes, they are keeping her up at night at this point. Has not had a period since her 24's however she was on mirena for years as well. She states the hot flashes will keep her up at night.  Past Medical History:  Diagnosis Date   Anxiety    Basal cell carcinoma (BCC) of face    Bell's palsy    H/O   Bipolar 1 disorder ([place])    Depression    Headache    MIGRAINES   History of genital warts     Past Surgical History:  Procedure Laterality Date   BREAST BIOPSY Right 07/03/2018   [place] bx           heart marker              path pending   BREAST CYST ASPIRATION Left 2014   by Dr. Tamala Julian   LAPAROSCOPIC TUBAL LIGATION Bilateral 11/10/2016   Procedure: LAPAROSCOPIC TUBAL LIGATION;  Surgeon: Benjaman Kindler, MD;  Location: [facility];  Service: Gynecology;  Laterality: Bilateral;   MOHS SURGERY     TONSILLECTOMY AND ADENOIDECTOMY     tubes in right ear      Family History  Problem Relation Age of Onset   Diabetes Maternal Grandfather    Breast cancer Paternal Grandmother 70   Colon cancer Paternal Grandfather 93   Pancreatic cancer Paternal Grandfather    Depression Mother    Depression Father     Lung cancer Maternal Aunt     Social History   Socioeconomic History   Marital status: Married    Spouse name: Marcello Moores   Number of children: 1   Years of education: Grad school   Highest education level: Not on file  Occupational History    Employer: [employer]   Occupation: [employer]  Tobacco Use   Smoking status: Never   Smokeless tobacco: Never  Vaping Use   Vaping Use: Never used  Substance and Sexual Activity   Alcohol use: Yes    Alcohol/week: 0.0 standard drinks of alcohol    Comment: 1-2 times a month   Drug use: No   Sexual activity: Yes    Partners: Male    Birth control/protection: Surgical  Other Topics Concern   Not on file  Social History Narrative   10/13/19   From: [place]   Living: with husband, [name]) - 2005   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      Family: Network engineer (2008)      Enjoys: watch TV - [place] Drama      Exercise: not currently   Diet: not great -  meat - starch veggie, fast food - twice daily      Safety   Seat belts: Yes    Guns: Yes  and secure   Safe in relationships: Yes    Social Determinants of Health   Financial Resource Strain: Not on file  Food Insecurity: Not on file  Transportation Needs: Not on file  Physical Activity: Not on file  Stress: Not on file  Social Connections: Not on file  Intimate Partner Violence: Not on file    Outpatient Medications Prior to Visit  Medication Sig Dispense Refill   ALPRAZolam (XANAX) 1 MG tablet TAKE ONE HALF TABLET BY MOUTH 2 TIMES DAILY 30 tablet 2   brexpiprazole (REXULTI) 1 MG TABS tablet Take 1 tablet (1 mg total) by mouth daily. 30 tablet 5   fluorouracil (EFUDEX) 5 % cream Apply 1 application topically 2 (two) times daily.     lamoTRIgine (LAMICTAL) 150 MG tablet TAKE 1 TABLET BY MOUTH EVERY DAY 90 tablet 3   ondansetron (ZOFRAN) 4 MG tablet Take 1 tablet (4 mg total) by mouth every 8 (eight) hours as needed for nausea or vomiting. 20 tablet 3   rizatriptan  (MAXALT-MLT) 10 MG disintegrating tablet Take 1 tablet (10 mg total) by mouth as needed for migraine. May repeat in 2 hours if needed 9 tablet 11   zolpidem (AMBIEN CR) 12.5 MG CR tablet TAKE ONE TABLET BY MOUTH DAILY AT BEDTIME 30 tablet 2   Fremanezumab-vfrm (AJOVY) 225 MG/1.5ML SOAJ Inject 225 mg into the skin every 30 (thirty) days. 1.68 mL 11   gabapentin (NEURONTIN) 100 MG capsule Take 2 capsules (200 mg total) by mouth 3 (three) times daily. 180 capsule 5   progesterone (PROMETRIUM) 200 MG capsule Take 1 capsule by mouth at bedtime. (Patient not taking: Reported on 02/20/2022)     propranolol (INDERAL) 60 MG tablet Take 1 tablet (60 mg total) by mouth 2 (two) times daily. (Patient not taking: Reported on 02/20/2022) 180 tablet 3   No facility-administered medications prior to visit.    Allergies  Allergen Reactions   Pollen Extract Other (See Comments)   Progesterone Other (See Comments)    Worsening depression   Soy Allergy Nausea Only and Other (See Comments)    intolerance      Review of Systems  ROS: Pertinent symptoms negative unless otherwise noted in HPI      Objective:    Physical Exam Vitals reviewed.  Constitutional:      Appearance: Normal appearance.  Eyes:     General:        Right eye: No discharge.        Left eye: No discharge.     Conjunctiva/sclera: Conjunctivae normal.  Cardiovascular:     Rate and Rhythm: Normal rate.  Pulmonary:     Effort: Pulmonary effort is normal. No respiratory distress.  Musculoskeletal:        General: Normal range of motion.     Cervical back: Normal range of motion.  Neurological:     General: No focal deficit present.     Mental Status: She is alert and oriented to person, place, and time. Mental status is at baseline.  Psychiatric:        Mood and Affect: Mood normal.        Behavior: Behavior normal.        Thought Content: Thought content normal.        Judgment: Judgment normal.       BP  124/82    Pulse 85   Temp 98.3 F (36.8 C) (Oral)   Ht 5' 8.5" (1.74 m)   Wt 207 lb 9.6 oz (94.2 kg)   LMP 08/08/2021 Comment: Menopause stages  SpO2 99%   BMI 31.11 kg/m  Wt Readings from Last 3 Encounters:  02/20/22 207 lb 9.6 oz (94.2 kg)  07/08/21 205 lb 6 oz (93.2 kg)  01/18/21 206 lb 8 oz (93.7 kg)     Health Maintenance Due  Topic Date Due   COLONOSCOPY (Pts 45-69yr Insurance coverage will need to be confirmed)  Never done    There are no preventive care reminders to display for this patient.  Lab Results  Component Value Date   TSH 2.15 08/16/2017   Lab Results  Component Value Date   WBC 6.5 07/08/2021   HGB 13.2 07/08/2021   HCT 39.6 07/08/2021   MCV 92.0 07/08/2021   PLT 274.0 07/08/2021   Lab Results  Component Value Date   NA 140 07/08/2021   K 3.8 07/08/2021   CO2 28 07/08/2021   GLUCOSE 76 07/08/2021   BUN 10 07/08/2021   CREATININE 0.90 07/08/2021   BILITOT 0.4 07/08/2021   ALKPHOS 60 07/08/2021   AST 12 07/08/2021   ALT 10 07/08/2021   PROT 6.9 07/08/2021   ALBUMIN 4.2 07/08/2021   CALCIUM 9.4 07/08/2021   ANIONGAP 9 02/18/2017   GFR 77.67 07/08/2021   Lab Results  Component Value Date   CHOL 172 07/08/2021   Lab Results  Component Value Date   HDL 51.20 07/08/2021   Lab Results  Component Value Date   LDLCALC 102 (H) 07/08/2021   Lab Results  Component Value Date   TRIG 92.0 07/08/2021   Lab Results  Component Value Date   CHOLHDL 3 07/08/2021   Lab Results  Component Value Date   HGBA1C 5.4 07/08/2021      Assessment & Plan:   Problem List Items Addressed This Visit       Cardiovascular and Mediastinum   Migraines - Primary   Hot flashes due to menopause   Relevant Orders   Ambulatory referral to Obstetrics / Gynecology   Prolactin   TSH   Follicle stimulating hormone     Other   Bipolar 1 disorder ([place]    Continue f/u with psychiatry as scheduled. Continue medications as prescribed.      Relevant Orders    Ambulatory referral to Psychology   Insomnia    Continue ambien 12.5 mg prn      Mixed hyperlipidemia   Low serum vitamin B12    Ordered b12 pending results Continue /restart b12 1000 mcg once daily      Relevant Orders   B12 and Folate Panel   Vitamin D deficiency   Relevant Orders   VITAMIN D 25 Hydroxy (Vit-D Deficiency, Fractures)   RESOLVED: Night sweats   Relevant Orders   CBC with Differential    No orders of the defined types were placed in this encounter.   Follow-up: Return in about 6 months (around 08/21/2022).    TEugenia Pancoast FNP

## 2022-02-20 NOTE — Assessment & Plan Note (Signed)
Continue ambien 12.5 mg prn

## 2022-02-20 NOTE — Assessment & Plan Note (Signed)
Continue f/u with psychiatry as scheduled. Continue medications as prescribed.

## 2022-02-20 NOTE — Assessment & Plan Note (Signed)
Ordered b12 pending results Continue /restart b12 1000 mcg once daily

## 2022-02-20 NOTE — Assessment & Plan Note (Deleted)
Ordered lipid panel, pending results. Work on low cholesterol diet and exercise as tolerated ? ?

## 2022-02-20 NOTE — Patient Instructions (Addendum)
------------------------------------   Go onto [email or web address] and look up therapists by your insurance, and see if you would like to see any of those, give them a call and get set up!   ------------------------------------   Recommend daily black cohash to see if this helps with your hot flashes.  Otherwise I would suggest following up with gynecology for possible Hormone treatment.   Start over the counter b12 1000 mcg once daily.   Recommend vitamin D3 2000 IU once daily.    ------------------------------------  A referral was placed today. Please let us know if you have not heard back within 2 weeks about the referral.   ------------------------------------  Welcome to our clinic, I am happy to have you as my new patient. I am excited to continue on this healthcare journey with you.  ------------------------------------  Stop by the lab prior to leaving today. I will notify you of your results once received.   Please keep in mind Any my chart messages you send have up to a three business day turnaround for a response.  Phone calls may take up to a one full business day turnaround for a  response.   If you need a medication refill I recommend you request it through the pharmacy as this is easiest for [place] rather than sending a message and or phone call.   Due to recent changes in healthcare laws, you may see results of your imaging and/or laboratory studies on MyChart before I have had a chance to review them.  I understand that in some cases there may be results that are confusing or concerning to you. Please understand that not all results are received at the same time and often I may need to interpret multiple results in order to provide you with the best plan of care or course of treatment. Therefore, I ask that you please give me 2 business days to thoroughly review all your results before contacting my office for clarification. Should we see a critical lab result, you  will be contacted sooner.   It was a pleasure seeing you today! Please do not hesitate to reach out with any questions and or concerns.  Regards,   [name] FNP-C

## 2022-02-20 NOTE — Assessment & Plan Note (Signed)
Suspected Order hormonal panel pending results Referral gyn for possible consideration hrt however progesterone causes increased depression for pt in past.  Recommend otc black cohash

## 2022-02-21 LAB — PROLACTIN: Prolactin: 7.4 ng/mL

## 2022-03-15 ENCOUNTER — Encounter: Payer: Self-pay | Admitting: Family

## 2022-03-15 ENCOUNTER — Ambulatory Visit: Payer: BC Managed Care – PPO | Admitting: Family

## 2022-03-15 VITALS — BP 128/88 | HR 78 | Temp 97.8°F | Ht 68.5 in | Wt 214.4 lb

## 2022-03-15 DIAGNOSIS — R300 Dysuria: Secondary | ICD-10-CM

## 2022-03-15 DIAGNOSIS — N3001 Acute cystitis with hematuria: Secondary | ICD-10-CM | POA: Diagnosis not present

## 2022-03-15 MED ORDER — SULFAMETHOXAZOLE-TRIMETHOPRIM 800-160 MG PO TABS: 1.0000 | ORAL_TABLET | Freq: Two times a day (BID) | ORAL | 0 refills | Status: AC

## 2022-03-15 MED ORDER — FLUCONAZOLE 150 MG PO TABS: 150.0000 mg | ORAL_TABLET | Freq: Once | ORAL | 0 refills | Status: AC

## 2022-03-15 NOTE — Progress Notes (Signed)
Established Patient Office Visit  Subjective:   Patient ID: Brittany Davila, female    DOB: 05/12/1975  Age: 47 y.o. MRN: [id number]  CC:  Chief Complaint  Patient presents with   Dysuria    HPI: Brittany Davila is a 47 y.o. female presenting on 03/15/2022 for Dysuria   Dysuria     For the last one week with symptoms of UTI.  C/o dysuria, frequency and urgency.  Denies flank pain.  Slight chills no fever. No pelvic tenderness. No vaginal discharge.  Has been masking them with AZO, last took two days ago.          ROS: Negative unless specifically indicated above in HPI.   Relevant past medical history reviewed and updated as indicated.   Allergies and medications reviewed and updated.   Current Outpatient Medications:    ALPRAZolam (XANAX) 1 MG tablet, TAKE ONE HALF TABLET BY MOUTH 2 TIMES DAILY, Disp: 30 tablet, Rfl: 2   brexpiprazole (REXULTI) 1 MG TABS tablet, Take 1 tablet (1 mg total) by mouth daily., Disp: 30 tablet, Rfl: 5   fluconazole (DIFLUCAN) 150 MG tablet, Take 1 tablet (150 mg total) by mouth once for 1 dose., Disp: 1 tablet, Rfl: 0   fluorouracil (EFUDEX) 5 % cream, Apply 1 application topically 2 (two) times daily., Disp: , Rfl:    lamoTRIgine (LAMICTAL) 150 MG tablet, TAKE 1 TABLET BY MOUTH EVERY DAY, Disp: 90 tablet, Rfl: 3   ondansetron (ZOFRAN) 4 MG tablet, Take 1 tablet (4 mg total) by mouth every 8 (eight) hours as needed for nausea or vomiting., Disp: 20 tablet, Rfl: 3   rizatriptan (MAXALT-MLT) 10 MG disintegrating tablet, Take 1 tablet (10 mg total) by mouth as needed for migraine. May repeat in 2 hours if needed, Disp: 9 tablet, Rfl: 11   sulfamethoxazole-trimethoprim (BACTRIM DS) 800-160 MG tablet, Take 1 tablet by mouth 2 (two) times daily for 7 days., Disp: 14 tablet, Rfl: 0   zolpidem (AMBIEN CR) 12.5 MG CR tablet, TAKE ONE TABLET BY MOUTH DAILY AT BEDTIME, Disp: 30 tablet, Rfl: 2  Allergies  Allergen Reactions   Pollen Extract Other (See  Comments)   Progesterone Other (See Comments)    Worsening depression   Soy Allergy Nausea Only and Other (See Comments)    intolerance    Objective:   BP 128/88   Pulse 78   Temp 97.8 F (36.6 C) (Temporal)   Ht 5' 8.5" (1.74 m)   Wt 214 lb 6.4 oz (97.3 kg)   LMP 08/08/2021 Comment: Menopause stages  SpO2 98%   BMI 32.13 kg/m    Physical Exam Constitutional:      General: She is not in acute distress.    Appearance: Normal appearance. She is normal weight. She is not ill-appearing, toxic-appearing or diaphoretic.  Cardiovascular:     Rate and Rhythm: Normal rate.  Pulmonary:     Effort: Pulmonary effort is normal.  Abdominal:     General: Abdomen is flat.     Tenderness: There is abdominal tenderness (mild left sided pelvic tenderness on palpation). There is no right CVA tenderness or left CVA tenderness.  Neurological:     General: No focal deficit present.     Mental Status: She is alert and oriented to person, place, and time. Mental status is at baseline.     Motor: No weakness.  Psychiatric:        Mood and Affect: Mood normal.  Behavior: Behavior normal.        Thought Content: Thought content normal.        Judgment: Judgment normal.     Assessment & Plan:  Dysuria -     POCT urinalysis dipstick -     Urine Culture -     Fluconazole; Take 1 tablet (150 mg total) by mouth once for 1 dose.  Dispense: 1 tablet; Refill: 0  Acute cystitis with hematuria Assessment & Plan: poct urine dip in office Urine culture ordered pending results antbx sent to pharmacy, pt to take as directed. Encouraged increased water intake throughout the day. Choosing to treat due to being symptomatic. If no improvement in the next 2 days pt advised to let me know.   Orders: -     Sulfamethoxazole-Trimethoprim; Take 1 tablet by mouth 2 (two) times daily for 7 days.  Dispense: 14 tablet; Refill: 0     Follow up plan: Return in about 6 months (around 09/13/2022) for f/u  cholesterol.  Brittany Pancoast, FNP

## 2022-03-15 NOTE — Patient Instructions (Signed)
It was a pleasure seeing you today.   You were found to have a urinary tract infection, you have been prescribed an antibiotic to your preferred pharmacy. Please start antibiotic today as directed.   We are sending your urine for a culture to make sure you do not have a resistant bacteria. We will call you if we need to change your medications.   Please make sure you are drinking plenty of fluids over the next few days.  If your symptoms do not improve over the next 5-7 days, or if they worsen, please let us know. Please also let us know if you have worsening back pain, fevers, chills, or body aches.   Regards,   Korbyn Chopin  

## 2022-03-15 NOTE — Assessment & Plan Note (Signed)
poct urine dip in office Urine culture ordered pending results antbx sent to pharmacy, pt to take as directed. Encouraged increased water intake throughout the day. Choosing to treat due to being symptomatic. If no improvement in the next 2 days pt advised to let me know.  

## 2022-03-17 LAB — URINE CULTURE
MICRO NUMBER:: 14564552
SPECIMEN QUALITY:: ADEQUATE

## 2022-03-23 ENCOUNTER — Ambulatory Visit (INDEPENDENT_AMBULATORY_CARE_PROVIDER_SITE_OTHER): Payer: BC Managed Care – PPO | Admitting: Clinical

## 2022-03-23 DIAGNOSIS — F319 Bipolar disorder, unspecified: Secondary | ICD-10-CM

## 2022-03-23 NOTE — Progress Notes (Signed)
                Olin Gurski, LCSW 
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
## 2022-04-04 ENCOUNTER — Telehealth: Payer: Self-pay | Admitting: Adult Health

## 2022-04-04 NOTE — Telephone Encounter (Signed)
Next visit is 04/21/22. Brittany Davila has gone to [facility] for her Rexulti 1 mg and used a discount card. The cost is $400.00. [employer] has told her she has an $1000 deductible. She has 7 of Rexulti left. She's not sure the next step she needs to take. Please call her at [phone].
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
## 2022-04-14 ENCOUNTER — Ambulatory Visit: Payer: BC Managed Care – PPO | Admitting: Adult Health

## 2022-04-20 ENCOUNTER — Ambulatory Visit: Payer: BC Managed Care – PPO | Admitting: Clinical

## 2022-04-20 DIAGNOSIS — F319 Bipolar disorder, unspecified: Secondary | ICD-10-CM

## 2022-04-20 NOTE — Progress Notes (Signed)
[place] Counselor/Therapist Progress Note  Patient ID: Brittany Davila, MRN: [id number],    Date: 04/20/2022  Time Spent: 2:41pm - 3:23pm : 42 minutes   Treatment Type: Individual Therapy  Reported Symptoms: Patient reported she continues to experience fluctuations in mood.   Mental Status Exam: Appearance:  Well Groomed     Behavior: Appropriate  Motor: Normal  Speech/Language:  Clear and Coherent  Affect: Appropriate  Mood: normal  Thought process: normal  Thought content:   WNL  Sensory/Perceptual disturbances:   WNL  Orientation: oriented to person, place, and situation  Attention: Good  Concentration: Good  Memory: WNL  Fund of knowledge:  Good  Insight:   Good  Judgment:  Good  Impulse Control: Fair   Risk Assessment: Danger to Self:  No Patient denied current suicidal ideation  Self-injurious Behavior: No Danger to Others: No Patient denied current homicidal ideation  Duty to Warn:no Physical Aggression / Violence:No  Access to Firearms a concern: No  Gang Involvement:No   Subjective: Patient stated, "I've been alright" and reported no changes since last session. Patient stated, "pretty good mood this week". Patient reported her mood continues to fluctuate but the fluctuations in mood are not as severe as they have been in the past. Patient reported she attended a work conference over the past week and reported returning home is a stressor. Patient reported her relationship with her daughter is a current stressor. Patient stated, "I definitely do want to continue to do therapy" in response to recommendation for therapy. Patient stated, "I hold onto to a lot of things and want to learn how to let go of it" and reported she would like to learn to let go of her expectations related to herself and her daughter. Patient reported feeling like "a failure" every other day. Patient reported low self confidence and low self esteem. Patient reported she frequently  seeks validation from others. Patient reported she has a significant amount of credit card debt and reported her husband is not aware of the debt. Patient reported her husband paid off a significant amount of patient's debt in the past and indicated he would pursue a divorce if the debt occurred again. Patient reported the debt is a result of excessive spending.    Interventions: Motivational Interviewing. Clinician conducted session in person at clinician's office at [facility]. Reviewed events since last session. Clinician reviewed diagnosis and treatment recommendations. Provided psycho education related to diagnosis and treatment. Clinician utilized motivational interviewing to explore potential goals for therapy.   Diagnosis:Bipolar 1 disorder ([place])  Plan: Goals to be developed during follow up appointment on 05/04/22.   Katherina Right, LCSW

## 2022-04-20 NOTE — Progress Notes (Signed)
                Keri Veale, LCSW 

## 2022-04-21 ENCOUNTER — Telehealth: Payer: Self-pay | Admitting: Adult Health

## 2022-04-21 ENCOUNTER — Ambulatory Visit: Payer: BC Managed Care – PPO | Admitting: Adult Health

## 2022-04-21 ENCOUNTER — Encounter: Payer: Self-pay | Admitting: Adult Health

## 2022-04-21 DIAGNOSIS — G4700 Insomnia, unspecified: Secondary | ICD-10-CM

## 2022-04-21 DIAGNOSIS — F411 Generalized anxiety disorder: Secondary | ICD-10-CM | POA: Diagnosis not present

## 2022-04-21 DIAGNOSIS — F319 Bipolar disorder, unspecified: Secondary | ICD-10-CM | POA: Diagnosis not present

## 2022-04-21 MED ORDER — ZOLPIDEM TARTRATE 10 MG PO TABS: 10.0000 mg | ORAL_TABLET | Freq: Every evening | ORAL | 0 refills | Status: DC | PRN

## 2022-04-21 MED ORDER — LAMOTRIGINE 200 MG PO TABS: ORAL_TABLET | ORAL | 1 refills | Status: DC

## 2022-04-21 MED ORDER — ALPRAZOLAM 1 MG PO TABS: ORAL_TABLET | ORAL | 2 refills | Status: DC

## 2022-04-21 NOTE — Telephone Encounter (Signed)
Next visit is 05/19/22. Brittany Davila with [facility] called. Brittany Davila recently picked up Ambien 10 mg and got it filled. Recently also picked up 10 days ago Ambien XR 12.5 mg. [facility] wants to know if she is switching to Ambien 10? Pharmacy:  [facility] [address], [place]   Phone: [phone]  Fax: [phone]

## 2022-04-21 NOTE — Telephone Encounter (Signed)
Ambien 10mg  at hs.

## 2022-04-21 NOTE — Telephone Encounter (Signed)
Please see note from pharmacy.  Your note from today:  Ambien CR 12.5mg  at hs  You sent in Rx today for Ambien 10 mg.  Which is correct?

## 2022-04-21 NOTE — Progress Notes (Addendum)
Brittany Davila [id number] [birth year] 47 y.o.  Subjective:   Patient ID:  Brittany Davila is a 47 y.o. (DOB Jun 22, 1975) female.  Chief Complaint: No chief complaint on file.   HPI [place] presents to the office today for follow-up of GAD, BPD, and insomnia   Describes mood today as "not the best". Pleasant. Mood symptoms - reports depression and anxiety. Denies irritability. Reports worry, rumination, and over thinking. Stating "I over think everything". Reports panic attacks. Mood is variable. Stating "I'm not feeling too good". Feels like medications are helpful, but is willing to consider other options. Decreased interest and motivation. Take medications as prescribed.  Energy levels lower. Active, does not a regular exercise routine.  Enjoys some usual interests and activities. Married. Lives with husband and old daughter. Spending time with family. Appetite adequate. Weight gain 215 - 218 pounds. Sleeping difficulties. Averages 5 to 6 hours a night - less over the past week. Focus and concentration difficulties - related to sleep issues. Completing tasks. Managing aspects of household. 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  Denies AH or VH. Working with therapist.    [place] Office Visit from 03/15/2022 in [place] at [facility] Visit from 07/08/2021 in [place] at [facility] Visit from 10/13/2019 in [place] at [facility]  Total GAD-7 Score 16 12 7       PHQ2-9    [place] Office Visit from 03/15/2022 in [place] at [place] Visit from 02/20/2022 in [place] at [place] Visit from 07/08/2021 in [place] at [facility] Visit from 10/13/2019 in [place] at [facility] Visit from 11/19/2017 in [place] at [facility]  PHQ-2 Total  Score 4 4 2 2  0  PHQ-9 Total Score 11 11 12 11  --        Review of Systems:  Review of Systems  Musculoskeletal:  Negative for gait problem.  Neurological:  Negative for tremors.  Psychiatric/Behavioral:         Please refer to HPI    Medications: I have reviewed the patient's current medications.  Current Outpatient Medications  Medication Sig Dispense Refill   zolpidem (AMBIEN) 10 MG tablet Take 1 tablet (10 mg total) by mouth at bedtime as needed for sleep. 30 tablet 0   ALPRAZolam (XANAX) 1 MG tablet TAKE ONE HALF TABLET BY MOUTH 2 TIMES DAILY 30 tablet 2   brexpiprazole (REXULTI) 1 MG TABS tablet Take 1 tablet (1 mg total) by mouth daily. 30 tablet 5   fluorouracil (EFUDEX) 5 % cream Apply 1 application topically 2 (two) times daily.     lamoTRIgine (LAMICTAL) 200 MG tablet TAKE 1 TABLET BY MOUTH EVERY DAY 90 tablet 1   ondansetron (ZOFRAN) 4 MG tablet Take 1 tablet (4 mg total) by mouth every 8 (eight) hours as needed for nausea or vomiting. 20 tablet 3   rizatriptan (MAXALT-MLT) 10 MG disintegrating tablet Take 1 tablet (10 mg total) by mouth as needed for migraine. May repeat in 2 hours if needed 9 tablet 11   No current facility-administered medications for this visit.    Medication Side Effects: None  Allergies:  Allergies  Allergen Reactions   Pollen Extract Other (See Comments)   Progesterone Other (See Comments)    Worsening depression   Soy Allergy Nausea  Only and Other (See Comments)    intolerance    Past Medical History:  Diagnosis Date   Anxiety    Basal cell carcinoma (BCC) of face    Bell's palsy    H/O   Bipolar 1 disorder (HCC)    Depression    Headache    MIGRAINES   History of genital warts     Past Medical History, Surgical history, Social history, and Family history were reviewed and updated as appropriate.   Please see review of systems for further details on the patient's review from today.   Objective:   Physical Exam:  There  were no vitals taken for this visit.  Physical Exam Constitutional:      General: She is not in acute distress. Musculoskeletal:        General: No deformity.  Neurological:     Mental Status: She is alert and oriented to person, place, and time.     Coordination: Coordination normal.  Psychiatric:        Attention and Perception: Attention and perception normal. She does not perceive auditory or visual hallucinations.        Mood and Affect: Mood normal. Mood is not anxious or depressed. Affect is not labile, blunt, angry or inappropriate.        Speech: Speech normal.        Behavior: Behavior normal.        Thought Content: Thought content normal. Thought content is not paranoid or delusional. Thought content does not include homicidal or suicidal ideation. Thought content does not include homicidal or suicidal plan.        Cognition and Memory: Cognition and memory normal.        Judgment: Judgment normal.     Comments: Insight intact     Lab Review:     Component Value Date/Time   NA 140 07/08/2021 0904   K 3.8 07/08/2021 0904   CL 106 07/08/2021 0904   CO2 28 07/08/2021 0904   GLUCOSE 76 07/08/2021 0904   BUN 10 07/08/2021 0904   CREATININE 0.90 07/08/2021 0904   CALCIUM 9.4 07/08/2021 0904   PROT 6.9 07/08/2021 0904   ALBUMIN 4.2 07/08/2021 0904   AST 12 07/08/2021 0904   ALT 10 07/08/2021 0904   ALKPHOS 60 07/08/2021 0904   BILITOT 0.4 07/08/2021 0904   GFRNONAA >60 02/18/2017 1320   GFRAA >60 02/18/2017 1320       Component Value Date/Time   WBC 5.6 02/20/2022 0847   RBC 4.79 02/20/2022 0847   HGB 14.3 02/20/2022 0847   HCT 43.5 02/20/2022 0847   PLT 311.0 02/20/2022 0847   MCV 90.9 02/20/2022 0847   MCH 30.6 02/18/2017 1320   MCHC 32.8 02/20/2022 0847   RDW 13.1 02/20/2022 0847   LYMPHSABS 2.2 02/20/2022 0847   MONOABS 0.5 02/20/2022 0847   EOSABS 0.1 02/20/2022 0847   BASOSABS 0.0 02/20/2022 0847    No results found for: "POCLITH", "LITHIUM"    No results found for: "PHENYTOIN", "PHENOBARB", "VALPROATE", "CBMZ"   .res Assessment: Plan:    Plan:  Gabapentin 200mg  TID  D/CAmbien CR 12.5mg  at hs Add Ambien 10mg  at hs  Xanax 1mg  - 1/2 tab BID  Increase Lamictal 150mg  to 200mg  at hs  Add Vraylar 1.5mg  daily D/C Rexulti 1mg  daily  RTC 4 weeks months  Continue FMLA - will bring in paperwork.   Time spent with patient was 25 minutes. Greater than 50% of face to face time  with patient was spent on counseling and coordination of care.    Patient advised to contact office with any questions, adverse effects, or acute worsening in signs and symptoms.  Discussed potential benefits, risk, and side effects of benzodiazepines to include potential risk of tolerance and dependence, as well as possible drowsiness.  Advised patient not to drive if experiencing drowsiness and to take lowest possible effective dose to minimize risk of dependence and tolerance.  Discussed potential metabolic side effects associated with atypical antipsychotics, as well as potential risk for movement side effects. Advised pt to contact office if movement side effects occur.  Diagnoses and all orders for this visit:  Generalized anxiety disorder -     ALPRAZolam (XANAX) 1 MG tablet; TAKE ONE HALF TABLET BY MOUTH 2 TIMES DAILY  Insomnia, unspecified type -     zolpidem (AMBIEN) 10 MG tablet; Take 1 tablet (10 mg total) by mouth at bedtime as needed for sleep.  Bipolar 1 disorder (HCC) -     lamoTRIgine (LAMICTAL) 200 MG tablet; TAKE 1 TABLET BY MOUTH EVERY DAY     Please see After Visit Summary for patient specific instructions.  Future Appointments  Date Time Provider [place]  05/04/2022  2:30 PM Katherina Right, LCSW [facility] None    No orders of the defined types were placed in this encounter.   -------------------------------

## 2022-04-21 NOTE — Telephone Encounter (Signed)
Pharmacy notified.

## 2022-05-04 ENCOUNTER — Ambulatory Visit: Payer: BC Managed Care – PPO | Admitting: Clinical

## 2022-05-04 DIAGNOSIS — F319 Bipolar disorder, unspecified: Secondary | ICD-10-CM | POA: Diagnosis not present

## 2022-05-04 NOTE — Progress Notes (Addendum)
[facility] Counselor/Therapist Progress Note  Patient ID: Brittany Davila, MRN: [id number]    Date: 05/04/22  Time Spent: 2:34  pm - 3:27 pm : 53 Minutes  Treatment Type: Individual Therapy.  Reported Symptoms: Patient reported no fluctuations in mood since last session.  Mental Status Exam: Appearance:  Well Groomed     Behavior: Appropriate  Motor: Normal  Speech/Language:  Clear and Coherent  Affect: Appropriate  Mood: normal  Thought process: normal  Thought content:   WNL  Sensory/Perceptual disturbances:   WNL  Orientation: oriented to person, place, and situation  Attention: Good  Concentration: Good  Memory: WNL  Fund of knowledge:  Good  Insight:   Good  Judgment:  Good  Impulse Control: Fair   Risk Assessment: Danger to Self:  No Patient denied current suicidal ideation  Self-injurious Behavior: No Danger to Others: No Patient denied current homicidal ideation  Duty to Warn:no Physical Aggression / Violence:No  Access to Firearms a concern: No  Gang Involvement:No   Subjective:  Patient reported no changes since last session. Patient reported she was recently prescribed vraylar by her psychiatrist and has been taking the medication for 2 weeks. Patient reported her psychiatrist recently increased patient's Lamictal to 200 mg daily. Patient reported no fluctuations in mood since last session. Patient reported she was offered a new job 2 weeks and will start her new job on Monday. Patient reported she is excited about the new position. Patient reported learning to "let it go" as it relates to resentment towards her father as a goal for therapy. Patient reported she is not content with herself, constantly compares herself to others, is envious of others and reported this is an area she wants to work on as it relates to goals for therapy. Patient reported she would like to work on "self doubt", "self loathing", and feeling inferior to others. Patient reported  has a lot of insecurities and stated, "I didn't live up to my potential". Patient reported she compares herself to her college roommates. Patient stated, "break unhealthy habits, like the spending" in response to goals and reported difficulty completing tasks/projects. Patient stated, "I need to work on my anger management". Patient reported she is easily angered and feels she is impatient. Patient reported she would like to stop reacting out of anger. Patient reported she would like to work towards forgiveness as it relates to her father and learning to forgive herself for her life choices. Patient participated in development of goals and agreed to goals for therapy.   Interventions: Motivational Interviewing. Clinician conducted session in person at clinician's office at [facility]. Assessed mood since last session and discussed recent changes in medication. Reviewed events since last session. Clinician utilized motivational interviewing to explore potential goals for therapy. Clinician utilized a task centered approach in collaboration with patient to develop goals for therapy. Clinician requested patient complete thought record for homework.   Diagnosis:  Bipolar I disorder   Plan: Patient is to utilize [employer], thought re-framing, problem solving, mindfulness and coping strategies to decrease symptoms associated with their diagnosis. Frequency: bi-weekly  Modality: individual     Long-term goal:   Identify, challenge, and replace negative core beliefs, thought patterns, and negative self talk that contribute to feelings of depression, low self confidence, self doubt, "self loathing", and low self esteem with positive thoughts, beliefs, and positive self talk per patient's report  Decrease feelings of depression, negative self talk, and cognitive distortions that contribute  to patient experiencing low self confidence, self doubt, "self loathing" and low self esteem  from 5 to 7 days per week to 2 to 3 days per week per patient's report Target Date: 05/04/23  Progress: 0   Short-term goal:  Increase insight and identify sources of low self esteem and patient frequently comparing herself to others Target Date: 11/03/22  Progress: 0   Write at least one positive affirmation about herself daily Target Date: 11/03/22  Progress: 0   Identify and verbally express contributing factors to patient's internal happiness  Target Date: 11/03/22  Progress: 0   Decrease in impulsive behaviors, such as, shopping/spending money, starting projects/tasks and not completing projects/tasks Target Date: 11/03/22  Progress: 0   Identify triggers for anger/irritability and develop coping strategies to utilize in response to feelings of anger/irritability per patient's report  Target Date: 11/03/22  Progress: 0   Begin the process of forgiving patient's father and herself for patient's life choices Target Date: 11/03/22  Progress: 0                       Doree Barthel, LCSW

## 2022-05-19 ENCOUNTER — Ambulatory Visit (INDEPENDENT_AMBULATORY_CARE_PROVIDER_SITE_OTHER): Payer: BC Managed Care – PPO | Admitting: Adult Health

## 2022-05-19 ENCOUNTER — Encounter: Payer: Self-pay | Admitting: Adult Health

## 2022-05-19 ENCOUNTER — Telehealth: Payer: Self-pay | Admitting: Adult Health

## 2022-05-19 DIAGNOSIS — G4700 Insomnia, unspecified: Secondary | ICD-10-CM | POA: Diagnosis not present

## 2022-05-19 DIAGNOSIS — F411 Generalized anxiety disorder: Secondary | ICD-10-CM

## 2022-05-19 DIAGNOSIS — F319 Bipolar disorder, unspecified: Secondary | ICD-10-CM | POA: Diagnosis not present

## 2022-05-19 MED ORDER — GABAPENTIN 100 MG PO CAPS: ORAL_CAPSULE | ORAL | 5 refills | Status: DC

## 2022-05-19 MED ORDER — ZOLPIDEM TARTRATE ER 12.5 MG PO TBCR: 12.5000 mg | EXTENDED_RELEASE_TABLET | Freq: Every evening | ORAL | 2 refills | Status: DC | PRN

## 2022-05-19 MED ORDER — CARIPRAZINE HCL 1.5 MG PO CAPS: 1.5000 mg | ORAL_CAPSULE | Freq: Every day | ORAL | 5 refills | Status: DC

## 2022-05-19 NOTE — Telephone Encounter (Signed)
[facility] Request [facility] Vraylar 1.5mg  , see CMM

## 2022-05-19 NOTE — Progress Notes (Signed)
Brittany Davila [id number] 1975/04/12 47 y.o.  Subjective:   Patient ID:  Brittany Davila is a 47 y.o. (DOB [birth year]) female.  Chief Complaint: No chief complaint on file.   HPI Brittany Davila presents to the office today for follow-up of GAD, BPD, and insomnia   Describes mood today as "not the best". Pleasant. Mood symptoms - reports decreased depression with starting Vraylar. Reports some anxiety. Reports decreased irritability. Denies panic attacks. Reports some worry, rumination, and over thinking. Mood has improved. Feels like recent medication changes have been helpful. Stating "I feel like I'm doing better". Improved interest and motivation. Take medications as prescribed.  Energy levels improved. Active, does not a regular exercise routine.  Enjoys some usual interests and activities. Married. Lives with husband and old daughter. Spending time with family. Appetite adequate. Weight loss 206 from 218 pounds. Sleeping difficulties. Averages 4.5 hours a night with the Ambien IR. Focus and concentration difficulties - related to sleep issues. Completing tasks. Managing aspects of household. Work going well Psychologist, prison and probation services. Denies SI or HI.  Denies AH or VH. Denies self harm. Denies substance use. Working with therapist - Doree Barthel.   GAD-7    Flowsheet Row Office Visit from 03/15/2022 in [facility] Visit from 07/08/2021 in [facility] at [facility] Visit from 10/13/2019 in [facility] at [facility]  Total GAD-7 Score 16 12 7       PHQ2-9    Flowsheet Row Office Visit from 03/15/2022 in [facility] at [facility] Office Visit from 02/20/2022 in [facility] at [facility] Office Visit from 07/08/2021 in [facility] at [facility] Visit from 10/13/2019 in [facility] at [facility] Visit from 11/19/2017 in [facility] at [facility]  PHQ-2 Total Score 4 4 2 2  0  PHQ-9 Total Score 11 11 12 11  --        Review of Systems:  Review of Systems  Musculoskeletal:  Negative for gait problem.  Neurological:  Negative for tremors.  Psychiatric/Behavioral:         Please refer to HPI    Medications: I have reviewed the patient's current medications.  Current Outpatient Medications  Medication Sig Dispense Refill   ALPRAZolam (XANAX) 1 MG tablet TAKE ONE HALF TABLET BY MOUTH 2 TIMES DAILY 30 tablet 2   brexpiprazole (REXULTI) 1 MG TABS tablet Take 1 tablet (1 mg total) by mouth daily. 30 tablet 5   fluorouracil (EFUDEX) 5 % cream Apply 1 application topically 2 (two) times daily.     lamoTRIgine (LAMICTAL) 200 MG tablet TAKE 1 TABLET BY MOUTH EVERY DAY 90 tablet 1   ondansetron (ZOFRAN) 4 MG tablet Take 1 tablet (4 mg total) by mouth every 8 (eight) hours as needed for nausea or vomiting. 20 tablet 3   rizatriptan (MAXALT-MLT) 10 MG disintegrating tablet Take 1 tablet (10 mg total) by mouth as needed for migraine. May repeat in 2 hours if needed 9 tablet 11   zolpidem (AMBIEN) 10 MG tablet Take 1 tablet (10 mg total) by mouth at bedtime as needed for sleep. 30 tablet 0   No current facility-administered medications for this visit.    Medication Side Effects: None  Allergies:  Allergies  Allergen Reactions   Pollen Extract Other (See Comments)   Progesterone Other (See Comments)    Worsening depression  Soy Allergy Nausea Only and Other (See Comments)    intolerance    Past Medical History:  Diagnosis Date   Anxiety    Basal cell carcinoma (BCC) of face    Bell's palsy    H/O   Bipolar 1 disorder (HCC)    Depression    Headache    MIGRAINES   History of genital warts     Past Medical History, Surgical history, Social history, and Family history were reviewed and updated as appropriate.   Please see review of systems for further details on the patient's review  from today.   Objective:   Physical Exam:  There were no vitals taken for this visit.  Physical Exam Constitutional:      General: She is not in acute distress. Musculoskeletal:        General: No deformity.  Neurological:     Mental Status: She is alert and oriented to person, place, and time.     Coordination: Coordination normal.  Psychiatric:        Attention and Perception: Attention and perception normal. She does not perceive auditory or visual hallucinations.        Mood and Affect: Mood normal. Mood is not anxious or depressed. Affect is not labile, blunt, angry or inappropriate.        Speech: Speech normal.        Behavior: Behavior normal.        Thought Content: Thought content normal. Thought content is not paranoid or delusional. Thought content does not include homicidal or suicidal ideation. Thought content does not include homicidal or suicidal plan.        Cognition and Memory: Cognition and memory normal.        Judgment: Judgment normal.     Comments: Insight intact     Lab Review:     Component Value Date/Time   NA 140 07/08/2021 0904   K 3.8 07/08/2021 0904   CL 106 07/08/2021 0904   CO2 28 07/08/2021 0904   GLUCOSE 76 07/08/2021 0904   BUN 10 07/08/2021 0904   CREATININE 0.90 07/08/2021 0904   CALCIUM 9.4 07/08/2021 0904   PROT 6.9 07/08/2021 0904   ALBUMIN 4.2 07/08/2021 0904   AST 12 07/08/2021 0904   ALT 10 07/08/2021 0904   ALKPHOS 60 07/08/2021 0904   BILITOT 0.4 07/08/2021 0904   GFRNONAA >60 02/18/2017 1320   GFRAA >60 02/18/2017 1320       Component Value Date/Time   WBC 5.6 02/20/2022 0847   RBC 4.79 02/20/2022 0847   HGB 14.3 02/20/2022 0847   HCT 43.5 02/20/2022 0847   PLT 311.0 02/20/2022 0847   MCV 90.9 02/20/2022 0847   MCH 30.6 02/18/2017 1320   MCHC 32.8 02/20/2022 0847   RDW 13.1 02/20/2022 0847   LYMPHSABS 2.2 02/20/2022 0847   MONOABS 0.5 02/20/2022 0847   EOSABS 0.1 02/20/2022 0847   BASOSABS 0.0 02/20/2022 0847     No results found for: "POCLITH", "LITHIUM"   No results found for: "PHENYTOIN", "PHENOBARB", "VALPROATE", "CBMZ"   .res Assessment: Plan:    Plan:  Gabapentin  TID  Add Ambien CR 12.5mg  at hs  Xanax  - 1/2 tab BID  Lamictal  at hs  Vraylar 1.5mg  daily  Restarted magnesium  RTC 3 months  Continue FMLA - will bring in paperwork.  Time spent with patient was 20 minutes. Greater than 50% of face to face time with patient was spent on counseling and coordination  of care.    Patient advised to contact office with any questions, adverse effects, or acute worsening in signs and symptoms.  Discussed potential benefits, risk, and side effects of benzodiazepines to include potential risk of tolerance and dependence, as well as possible drowsiness.  Advised patient not to drive if experiencing drowsiness and to take lowest possible effective dose to minimize risk of dependence and tolerance.  Discussed potential metabolic side effects associated with atypical antipsychotics, as well as potential risk for movement side effects. Advised pt to contact office if movement side effects occur.  There are no diagnoses linked to this encounter.   Please see After Visit Summary for patient specific instructions.  Future Appointments  Date Time Provider Department Center  06/08/2022  2:30 PM Doree Barthel, LCSW [facility] None  06/22/2022  2:30 PM Doree Barthel, LCSW [facility] None  07/06/2022  2:30 PM Doree Barthel, LCSW [facility] None    No orders of the defined types were placed in this encounter.   -------------------------------

## 2022-05-22 ENCOUNTER — Telehealth: Payer: Self-pay | Admitting: Adult Health

## 2022-05-22 NOTE — Telephone Encounter (Signed)
[facility] approved VRAYLAR  05/19/22-05/18/2025

## 2022-05-29 NOTE — Telephone Encounter (Signed)
Prior Approval received for Vraylar 1.5 mg with [employer] through 05/18/2025

## 2022-06-08 ENCOUNTER — Ambulatory Visit: Payer: BC Managed Care – PPO | Admitting: Clinical

## 2022-06-08 ENCOUNTER — Encounter: Payer: Self-pay | Admitting: Family

## 2022-06-08 DIAGNOSIS — F319 Bipolar disorder, unspecified: Secondary | ICD-10-CM | POA: Diagnosis not present

## 2022-06-08 NOTE — Progress Notes (Signed)
[facility] Counselor/Therapist Progress Note  Patient ID: Brittany Davila, MRN: [id number],    Date: 06/08/2022  Time Spent: 2:37pm - 3:32pm : 55 minutes   Treatment Type: Individual Therapy  Reported Symptoms: Patient reported she is currently experiencing a migraine  Mental Status Exam: Appearance:  Well Groomed     Behavior: Appropriate  Motor: Normal  Speech/Language:  Clear and Coherent  Affect: Tearful  Mood: sad  Thought process: normal  Thought content:   WNL  Sensory/Perceptual disturbances:   WNL  Orientation: oriented to person, place, and situation  Attention: Good  Concentration: Good  Memory: WNL  Fund of knowledge:  Good  Insight:   Good  Judgment:  Good  Impulse Control: Fair   Risk Assessment: Danger to Self:  No Patient denied current suicidal ideation  Self-injurious Behavior: No Danger to Others: No Patient denied current homicidal ideation Duty to Warn:no Physical Aggression / Violence:No  Access to Firearms a concern: No  Gang Involvement:No   Subjective: Patient reported she currently has a migraine and gets headaches when discontinuing caffeine. Patient stated, "it has its good days and its bad days" in regards to her new position. Patient reported experiencing stress related to work and feels stress is a trigger migraines. Patient reported feeling she should know how to perform all aspects of her new job. Patient stated, "its just been frustrating for me". Patient stated,  "I know its not" in regards to patient's expectation being unrealistic.  Patient stated, "it was ok" in response to patient's mood since last session. Patient reported her aunt passed away recently and stated, "for the past week and a half I've been sort of down". Patient reported she has experiencing ruminating thoughts regarding patient not living close to her family and reported feeling sad that she doesn't have a close relationship with her family. During session,  patient provided several examples documented in her thought record. Patient stated, "I just bottle a lot of stuff in" in response to documentation in her thought record. Patient reported her husband make remarks that make patient feel belittled and question her capabilities. Patient reported she has asked her husband to refrain from making those remarks and he responds that he is joking with patient. Patient reported her husband talks over patient. Patient reported feeling belittled and disrespected in several entries in her thought record. Patient reported she has approached her husband about marriage counseling in the past and he refused participate.    Interventions: Cognitive Behavioral Therapy and supportive therapy.  Clinician conducted session in person at clinician's office at [facility]. Reviewed events since last session. Provided supportive therapy, active and reflective listening, and validation as patient discussed recent job transition and the loss of her aunt. Explored patient's expectations related to learning her new job and challenged cognitive distortions related to patient's expectations. Provided psycho education related to grief, core beliefs, and cognitive restructuring. Reviewed patient's thought record for homework. Discussed marriage counseling. Clinician requested patient continue thought record for homework.   Collaboration of Care: Other not required at this time.    Diagnosis:  Bipolar I disorder     Plan: Patient is to utilize [employer], thought re-framing, problem solving, mindfulness and coping strategies to decrease symptoms associated with their diagnosis. Frequency: bi-weekly  Modality: individual      Long-term goal:   Identify, challenge, and replace negative core beliefs, thought patterns, and negative self talk that contribute to feelings of depression, low self confidence, self doubt, "  self loathing", and low self esteem with  positive thoughts, beliefs, and positive self talk per patient's report   Decrease feelings of depression, negative self talk, and cognitive distortions that contribute to patient experiencing low self confidence, self doubt, "self loathing" and low self esteem from 5 to 7 days per week to 2 to 3 days per week per patient's report Target Date: 05/04/23  Progress: progressing    Short-term goal:  Increase insight and identify sources of low self esteem and patient frequently comparing herself to others Target Date: 11/03/22  Progress: progressing    Write at least one positive affirmation about herself daily Target Date: 11/03/22  Progress: progressing    Identify and verbally express contributing factors to patient's internal happiness  Target Date: 11/03/22  Progress: progressing    Decrease in impulsive behaviors, such as, shopping/spending money, starting projects/tasks and not completing projects/tasks Target Date: 11/03/22  Progress: progressing    Identify triggers for anger/irritability and develop coping strategies to utilize in response to feelings of anger/irritability per patient's report  Target Date: 11/03/22  Progress: progressing    Begin the process of forgiving patient's father and herself for patient's life choices Target Date: 11/03/22  Progress: progressing                     Doree Barthel, LCSW

## 2022-06-08 NOTE — Progress Notes (Signed)
                Jovanni Eckhart, LCSW 

## 2022-06-22 ENCOUNTER — Ambulatory Visit: Payer: BC Managed Care – PPO | Admitting: Clinical

## 2022-06-30 ENCOUNTER — Other Ambulatory Visit: Payer: Self-pay | Admitting: Adult Health

## 2022-06-30 ENCOUNTER — Telehealth: Payer: Self-pay | Admitting: Adult Health

## 2022-06-30 DIAGNOSIS — G4700 Insomnia, unspecified: Secondary | ICD-10-CM

## 2022-06-30 NOTE — Telephone Encounter (Signed)
[place]

## 2022-07-04 ENCOUNTER — Other Ambulatory Visit: Payer: Self-pay | Admitting: Adult Health

## 2022-07-04 DIAGNOSIS — G4700 Insomnia, unspecified: Secondary | ICD-10-CM

## 2022-07-05 ENCOUNTER — Other Ambulatory Visit: Payer: Self-pay

## 2022-07-05 ENCOUNTER — Telehealth: Payer: Self-pay | Admitting: Adult Health

## 2022-07-05 NOTE — Telephone Encounter (Signed)
Rx for 10 mg pended

## 2022-07-05 NOTE — Telephone Encounter (Signed)
Next visit is 08/18/22. Brittany Davila called requesting her Zolpidem be refilled for 10 mg but states that the provider denied it. She said that the Zolpidem  12.5 mg is on national back order. She is wanting something to help her sleep. She would like a phone call back at [phone] to find out why it was denied.

## 2022-07-06 ENCOUNTER — Ambulatory Visit: Payer: BC Managed Care – PPO | Admitting: Clinical

## 2022-07-06 DIAGNOSIS — F319 Bipolar disorder, unspecified: Secondary | ICD-10-CM

## 2022-07-06 MED ORDER — ZOLPIDEM TARTRATE 10 MG PO TABS: 10.0000 mg | ORAL_TABLET | Freq: Every evening | ORAL | 0 refills | Status: DC | PRN

## 2022-07-06 NOTE — Progress Notes (Signed)
                Brittany Joubert, LCSW 

## 2022-07-06 NOTE — Progress Notes (Signed)
[facility] Counselor/Therapist Progress Note  Patient ID: Brittany Davila, MRN: [id number],    Date: 07/06/2022  Time Spent: 2:42pm - 3:30pm : 48 minutes   Treatment Type: Individual Therapy  Reported Symptoms: Patient reported difficulty sleeping, decreased concentration, and reported "brain fog"  Mental Status Exam: Appearance:  Neat and Well Groomed     Behavior: Appropriate  Motor: Normal  Speech/Language:  Clear and Coherent  Affect: Appropriate  Mood: normal  Thought process: normal  Thought content:   WNL  Sensory/Perceptual disturbances:   WNL  Orientation: oriented to person, place, and situation  Attention: Fair  Concentration: Fair  Memory: WNL  Fund of knowledge:  Good  Insight:   Good  Judgment:  Good  Impulse Control: Fair   Risk Assessment: Danger to Self:  No Patient denied current suicidal ideation  Self-injurious Behavior: No Danger to Others: No Patient denied current homicidal ideation Duty to Warn:no Physical Aggression / Violence:No  Access to Firearms a concern: No  Gang Involvement:No   Subjective: Patient reported she missed her last therapy appointment due to illness. Patient stated, "I have some good days and bad days" in response to events since last session. Patient reported she injured her ankle and has an appointment with a surgeon tomorrow to discuss patient's imaging results. Patient reported she is concerned about the appointment tomorrow with the surgeon.  Patient stated, "this week has been pretty rough because I haven't been sleeping". Patient reported she called her psychiatrist to inform the psychiatrist that she is unable to obtain her sleep medication due to the medication being on back order. Patient stated, "the no sleep causes me to be an emotional roller coaster". Patient reported she watches TikTok prior to going to sleep and has the television on at night. Patient reported difficulty falling asleep and stated "that's when  the thoughts creep in. Patient reported she drinks caffeine throughout the day. Patient stated,  "I'm just a little like foggy", "other than that I feel good" in response to patient's current mood. Patient stated,  "I forgot about it" in response to patient's homework.   Interventions: Cognitive Behavioral Therapy and psycho education.  Clinician conducted session in person at clinician's office at [facility]. Reviewed events since last session. Discussed recent missed appointment and the practice's NS/late cancel policy. Explored and identified triggers for recent decline in mood. Explored patient's sleep hygiene and provided psycho education related to healthy sleep hygiene. Reviewed patient's homework and discussed barriers to completing homework. Clinician requested patient continue thought record for homework and maintain a sleep diary.    Collaboration of Care: Other not required at this time.    Diagnosis:  Bipolar I disorder     Plan: Patient is to utilize [employer], thought re-framing, problem solving, mindfulness and coping strategies to decrease symptoms associated with their diagnosis. Frequency: bi-weekly  Modality: individual      Long-term goal:   Identify, challenge, and replace negative core beliefs, thought patterns, and negative self talk that contribute to feelings of depression, low self confidence, self doubt, "self loathing", and low self esteem with positive thoughts, beliefs, and positive self talk per patient's report   Decrease feelings of depression, negative self talk, and cognitive distortions that contribute to patient experiencing low self confidence, self doubt, "self loathing" and low self esteem from 5 to 7 days per week to 2 to 3 days per week per patient's report Target Date: 05/04/23  Progress: progressing    Short-term  goal:  Increase insight and identify sources of low self esteem and patient frequently comparing herself to  others Target Date: 11/03/22  Progress: progressing    Write at least one positive affirmation about herself daily Target Date: 11/03/22  Progress: progressing    Identify and verbally express contributing factors to patient's internal happiness  Target Date: 11/03/22  Progress: progressing    Decrease in impulsive behaviors, such as, shopping/spending money, starting projects/tasks and not completing projects/tasks Target Date: 11/03/22  Progress: progressing    Identify triggers for anger/irritability and develop coping strategies to utilize in response to feelings of anger/irritability per patient's report  Target Date: 11/03/22  Progress: progressing    Begin the process of forgiving patient's father and herself for patient's life choices Target Date: 11/03/22  Progress: progressing                 Doree Barthel, LCSW

## 2022-07-20 ENCOUNTER — Ambulatory Visit: Payer: BC Managed Care – PPO | Admitting: Clinical

## 2022-08-17 ENCOUNTER — Ambulatory Visit: Payer: BC Managed Care – PPO | Admitting: Clinical

## 2022-08-18 ENCOUNTER — Ambulatory Visit: Payer: BC Managed Care – PPO | Admitting: Adult Health

## 2022-08-21 ENCOUNTER — Ambulatory Visit: Payer: BC Managed Care – PPO | Admitting: Adult Health

## 2022-08-28 ENCOUNTER — Ambulatory Visit (INDEPENDENT_AMBULATORY_CARE_PROVIDER_SITE_OTHER): Payer: BC Managed Care – PPO | Admitting: Adult Health

## 2022-08-28 ENCOUNTER — Encounter: Payer: Self-pay | Admitting: Adult Health

## 2022-08-28 DIAGNOSIS — F319 Bipolar disorder, unspecified: Secondary | ICD-10-CM

## 2022-08-28 DIAGNOSIS — F411 Generalized anxiety disorder: Secondary | ICD-10-CM

## 2022-08-28 DIAGNOSIS — G4700 Insomnia, unspecified: Secondary | ICD-10-CM | POA: Diagnosis not present

## 2022-08-28 MED ORDER — ALPRAZOLAM 1 MG PO TABS: ORAL_TABLET | ORAL | 2 refills | Status: DC

## 2022-08-28 MED ORDER — CARIPRAZINE HCL 1.5 MG PO CAPS: 1.5000 mg | ORAL_CAPSULE | Freq: Every day | ORAL | 5 refills | Status: DC

## 2022-08-28 MED ORDER — LAMOTRIGINE 25 MG PO TABS: ORAL_TABLET | ORAL | 5 refills | Status: DC

## 2022-08-28 MED ORDER — LAMOTRIGINE 200 MG PO TABS: ORAL_TABLET | ORAL | 1 refills | Status: DC

## 2022-08-28 MED ORDER — ZOLPIDEM TARTRATE ER 12.5 MG PO TBCR: 12.5000 mg | EXTENDED_RELEASE_TABLET | Freq: Every evening | ORAL | 2 refills | Status: DC | PRN

## 2022-08-28 MED ORDER — GABAPENTIN 100 MG PO CAPS: ORAL_CAPSULE | ORAL | 5 refills | Status: AC

## 2022-08-28 NOTE — Progress Notes (Signed)
Brittany Davila [id number] 07/07/1975 47 y.o.  Subjective:   Patient ID:  Brittany Davila is a 47 y.o. (DOB September 18, 1975) female.  Chief Complaint: No chief complaint on file.   HPI Brittany Davila presents to the office today for follow-up of GAD, BPD, and insomnia   Describes mood today as "so-so". Pleasant. Denies tearfulness. Mood symptoms - reports increased depression - job and money stress. Reports anxiety - started a new job. Denies irritability. Reports panic attacks. Reports some worry, rumination, and over thinking. Mood is lower - denies mood swings. Feels like medications are helpful, but is willing to consider other options. Stating "I don't feel like I'm doing as well". Improved interest and motivation. Take medications as prescribed.  Energy levels improved. Active, does not a regular exercise routine.  Enjoys some usual interests and activities. Married. Lives with husband and old daughter. Spending time with family. Appetite adequate. Weight stable - 218 pounds. Sleeping difficulties. Averages 5 to 6 hours a night. Focus and concentration difficulties - related to sleep issues. Completing tasks. Managing aspects of household. Work going well Psychologist, prison and probation services. Denies SI or HI.  Denies AH or VH. Denies self harm. Denies substance use.      GAD-7    Flowsheet Row Office Visit from 03/15/2022 in [facility] Visit from 07/08/2021 in [facility] Visit from 10/13/2019 in [facility]  Total GAD-7 Score 16 12 7       PHQ2-9    Flowsheet Row Office Visit from 03/15/2022 in [facility] Office Visit from 02/20/2022 in [facility] at [facility] Office Visit from 07/08/2021 in [facility] Visit from 10/13/2019 in [facility] Visit from 11/19/2017  in [facility] at [facility]  PHQ-2 Total Score 4 4 2 2  0  PHQ-9 Total Score 11 11 12 11  --        Review of Systems:  Review of Systems  Musculoskeletal:  Negative for gait problem.  Neurological:  Negative for tremors.  Psychiatric/Behavioral:         Please refer to HPI    Medications: I have reviewed the patient's current medications.  Current Outpatient Medications  Medication Sig Dispense Refill   ALPRAZolam (XANAX) 1 MG tablet TAKE ONE HALF TABLET BY MOUTH 2 TIMES DAILY 30 tablet 2   cariprazine (VRAYLAR) 1.5 MG capsule Take 1 capsule (1.5 mg total) by mouth daily. 30 capsule 5   fluorouracil (EFUDEX) 5 % cream Apply 1 application topically 2 (two) times daily.     gabapentin (NEURONTIN) 100 MG capsule Take two capsules three times daily. 180 capsule 5   lamoTRIgine (LAMICTAL) 200 MG tablet TAKE 1 TABLET BY MOUTH EVERY DAY 90 tablet 1   ondansetron (ZOFRAN) 4 MG tablet Take 1 tablet (4 mg total) by mouth every 8 (eight) hours as needed for nausea or vomiting. 20 tablet 3   rizatriptan (MAXALT-MLT) 10 MG disintegrating tablet Take 1 tablet (10 mg total) by mouth as needed for migraine. May repeat in 2 hours if needed 9 tablet 11   zolpidem (AMBIEN CR) 12.5 MG CR tablet Take 1 tablet (12.5 mg total) by mouth at bedtime as needed for sleep. 30 tablet 2   zolpidem (AMBIEN) 10 MG tablet Take 1 tablet (10 mg total) by mouth at bedtime  as needed for sleep. 30 tablet 0   No current facility-administered medications for this visit.    Medication Side Effects: None  Allergies:  Allergies  Allergen Reactions   Pollen Extract Other (See Comments)   Progesterone Other (See Comments)    Worsening depression   Soy Allergy Nausea Only and Other (See Comments)    intolerance    Past Medical History:  Diagnosis Date   Anxiety    Basal cell carcinoma (BCC) of face    Bell's palsy    H/O   Bipolar 1 disorder (HCC)    Depression    Headache    MIGRAINES    History of genital warts     Past Medical History, Surgical history, Social history, and Family history were reviewed and updated as appropriate.   Please see review of systems for further details on the patient's review from today.   Objective:   Physical Exam:  There were no vitals taken for this visit.  Physical Exam Constitutional:      General: She is not in acute distress. Musculoskeletal:        General: No deformity.  Neurological:     Mental Status: She is alert and oriented to person, place, and time.     Coordination: Coordination normal.  Psychiatric:        Attention and Perception: Attention and perception normal. She does not perceive auditory or visual hallucinations.        Mood and Affect: Affect is not labile, blunt, angry or inappropriate.        Speech: Speech normal.        Behavior: Behavior normal.        Thought Content: Thought content normal. Thought content is not paranoid or delusional. Thought content does not include homicidal or suicidal ideation. Thought content does not include homicidal or suicidal plan.        Cognition and Memory: Cognition and memory normal.        Judgment: Judgment normal.     Comments: Insight intact     Lab Review:     Component Value Date/Time   NA 140 07/08/2021 0904   K 3.8 07/08/2021 0904   CL 106 07/08/2021 0904   CO2 28 07/08/2021 0904   GLUCOSE 76 07/08/2021 0904   BUN 10 07/08/2021 0904   CREATININE 0.90 07/08/2021 0904   CALCIUM 9.4 07/08/2021 0904   PROT 6.9 07/08/2021 0904   ALBUMIN 4.2 07/08/2021 0904   AST 12 07/08/2021 0904   ALT 10 07/08/2021 0904   ALKPHOS 60 07/08/2021 0904   BILITOT 0.4 07/08/2021 0904   GFRNONAA >60 02/18/2017 1320   GFRAA >60 02/18/2017 1320       Component Value Date/Time   WBC 5.6 02/20/2022 0847   RBC 4.79 02/20/2022 0847   HGB 14.3 02/20/2022 0847   HCT 43.5 02/20/2022 0847   PLT 311.0 02/20/2022 0847   MCV 90.9 02/20/2022 0847   MCH 30.6 02/18/2017 1320    MCHC 32.8 02/20/2022 0847   RDW 13.1 02/20/2022 0847   LYMPHSABS 2.2 02/20/2022 0847   MONOABS 0.5 02/20/2022 0847   EOSABS 0.1 02/20/2022 0847   BASOSABS 0.0 02/20/2022 0847    No results found for: "POCLITH", "LITHIUM"   No results found for: "PHENYTOIN", "PHENOBARB", "VALPROATE", "CBMZ"   .res Assessment: Plan:    Plan:  Gabapentin 200mg  TID  Ambien CR 12.5mg  at hs  Xanax 1mg  - 1/2 tab BID  Lamictal 200mg  at hs  Vraylar  1.5mg  daily  Add Lamictal 25 - 2 every morning.  Restarted magnesium  RTC 3 months  Continue FMLA - will bring in paperwork.  Time spent with patient was 20 minutes. Greater than 50% of face to face time with patient was spent on counseling and coordination of care.    Patient advised to contact office with any questions, adverse effects, or acute worsening in signs and symptoms.  Discussed potential benefits, risk, and side effects of benzodiazepines to include potential risk of tolerance and dependence, as well as possible drowsiness.  Advised patient not to drive if experiencing drowsiness and to take lowest possible effective dose to minimize risk of dependence and tolerance.  Discussed potential metabolic side effects associated with atypical antipsychotics, as well as potential risk for movement side effects. Advised pt to contact office if movement side effects occur.  There are no diagnoses linked to this encounter.   Please see After Visit Summary for patient specific instructions.  Future Appointments  Date Time Provider Department Center  08/28/2022  8:40 AM Abdoulaye Drum, Thereasa Solo, NP [facility] None    No orders of the defined types were placed in this encounter.   -------------------------------

## 2022-10-17 NOTE — Progress Notes (Unsigned)
PATIENT: Brittany Davila DOB: 04-25-1975  REASON FOR VISIT: follow up HISTORY FROM: patient Primary Neurologist: Dr. Terrace Arabia   ASSESSMENT AND PLAN 47 y.o. year old female  has a past medical history of Anxiety, Basal cell carcinoma (BCC) of face, Bell's palsy, Bipolar 1 disorder (HCC), Depression, Headache, and History of genital warts. here with:  1.  Chronic migraine headache -Restart Ajovy 225 mg monthly injection for migraine prevention  -Continue Maxalt melt 10 mg as needed for acute headache, I gave her sample of Nurtec to try, if she likes Nurtec will send in a prescription -Stop daily Excedrin Migraine, BC usage, I have concern for rebound headache -MRI of the brain was normal September 2020 -Previously tried and failed: Imitrex, propranolol, is not a candidate for Topamax due to history of kidney stones, antidepressants worsen her depression and make her feel suicidal; currently taking Lamictal, gabapentin -Follow-up in 6 months or sooner if needed, encouraged to reach out via MyChart if needed  2.  Paresthesia left arm and leg -Nearly resolved, mostly just in the left leg -EMG evaluation in December 2020 was normal of the left arm and left leg -MRI lumbar spine in November 2019 was normal -MRI of thoracic spine was normal, incidental left renal cystic structure measuring 4.9 cm.  MRI of cervical spine was normal  3.  Bipolar disorder, anxiety -Remains under the care of psychiatry, on Lamictal and gabapentin, xanax   HISTORY  Brittany Davila is a 47 year old female, seen in request by her primary care physician Dr. Billy Fischer for evaluation of headaches initial evaluation was on September 05, 2018.   I have reviewed and summarized the referring note from the referring physician.  She had a past medical history of bipolar disorder, has been on stable dose of Lamictal 150 mg twice a day, she began to have migraine headaches since 2018, increased frequency over the past 3 months, she  complains of dizziness, especially with sudden positional change, such as bending over, losing her balance easily, she also complains of intermittent right face icy cold sensation, numbness, sometimes she feels her body spinning back-and-forth with movement, she also has bilateral frontal retro-orbital area pressure headaches, she has migraine headaches about couple times each week, she describes migraine as right retro-orbital area severe pounding headache with sensitive to light and noise, 2 to 3 days, she has tried Excedrin Migraine and muscle relaxant with some helps, in addition she complains of intermittent left arm and leg pain, low back pain,   Update November 06, 2018 SS: MRI of the brain 10/02/2018 was normal.    Since starting propanolol, is now having less intense, dull headaches, 2-3 times a week.  It does not prevent her from doing anything.  The headache is located right occipitally, spreads forward, Icy/cold sensation to her right face with headache.  She has history of right-sided Bell's palsy when she was in college, feels that same sensation.  Imitrex has been beneficial, but rarely has to take it.  She will now take Excedrin Migraine with modest benefit.  She reports stress, works full-time, has a 46 year old, taking 2 college classes.  With her headaches, she reports photophobia, phonophobia, at times nausea.  She denies weakness or drooping of the face.  She reports 2 years ago, she developed left-sided low back pain, numbness to her left leg, pain down her leg, hot/cold sensation to her left calf, behind her left knee.  At times, her left arm will go numb.  She says she feels the best when she is standing.  She has had MRI of her lumbar spine that was normal.  She completed a few sessions of physical therapy that were beneficial.  She presents today for follow-up unaccompanied.   Update March 11, 2019 SS: At last visit propanolol was increased 60 mg twice a day, EMG nerve evaluation in  December 2020 was normal of the left upper and lower extremity.  She continues to complain of numbness to her left leg, pain, hot/cold sensation, from her mid calf up to her mid thigh.  She does not experience the pain if she is up moving around.  It occurs during times of sitting.  The numbness to her left arm, is now very rare.  She denies bowel or urinary incontinence.  She has not had any falls.  She denies any back pain.  The physical therapy she did prior, was helpful for the back pain, not the leg symptoms.  Her headaches have improved with propanolol.  She says she may have 1 dull headache a week, that is relieved with Tylenol or Excedrin Migraine to her right occipital area.  She says also at night when lying flat, on her wedge pillow, she may have numbness to the right side of her face.  She is now taking gabapentin from her psychiatrist for irritability.  She has Abilify to take if the gabapentin does not work.  The gabapentin has also helped with her leg pain.  She presents today for evaluation unaccompanied.  Update July 12, 2020 SS: Here today for follow-up unaccompanied, left leg paresthesia is 80% resolved.  Now taking zinc, vitamin D3, B6, B complex.  Did a food sensitivity test, made eliminations.  Has been out of propanolol for several months.  On average 1-2 migraines a week.  Does not like the way Imitrex makes her feel (dizzy, nauseated, tingling "weird", can only take when at home, starts with Excedrin Migraine.  Missing 1 to 2 days a month. Works at [employer] with [employer]. Has FMLA for bipolar disorder.   Saw urology, known left kidney stone, left renal cyst was not mentioned.  Mood is stable, continues to see psychiatry. Feels more agitated with headache.  MRI of thoracic spine was normal, incidental left renal cystic structure measuring 4.9 cm.  MRI of cervical spine was normal.  Update January 18, 2021 SS: Taking propranolol 60 twice daily, daily headache, 1 migraine a  week, missing 1 day of work a week. Husbands snoring makes it worse usually has in the morning, the Maxalt works but makes sleepy. Has FMLA for bipolar disorder. On hormones progesterone and estradiol for perimenopause. Was tried on antidepressant, make her depressed, almost suicidal. Left sided sensory symptoms essentially resolved.   Update October 18, 2022 SS: has not been seen since Dec 2022. Last seen was given Ajovy, took for 6 months with good benefit, stopped it a lot going on. Is off propranolol. Maxalt was working well, would see spots, zone out for a little at work, but worked well. Works at [employer] in the office, has changed jobs, more stress, Educational psychologist. 2 migraines weekly, right occipital area, radiates forward, behind right eye. Migraine features, migraine today, wearing sunglasses. Taking excedrin migraine, BC powder, is taking daily. Has daily headache that is mild to moderate, continues on the right side. Has FMLA from psychiatry, misses about 1 day a week of work.   REVIEW OF SYSTEMS: Out of a complete 14 system review of symptoms,  the patient complains only of the following symptoms, and all other reviewed systems are negative.  Headache  ALLERGIES: Allergies  Allergen Reactions   Pollen Extract Other (See Comments)   Progesterone Other (See Comments)    Worsening depression   Soy Allergy Nausea Only and Other (See Comments)    intolerance  Other Reaction(s): Other (See Comments)    HOME MEDICATIONS: Outpatient Medications Prior to Visit  Medication Sig Dispense Refill   ALPRAZolam (XANAX) 1 MG tablet TAKE ONE HALF TABLET BY MOUTH 2 TIMES DAILY 30 tablet 2   cariprazine (VRAYLAR) 1.5 MG capsule Take 1 capsule (1.5 mg total) by mouth daily. 30 capsule 5   gabapentin (NEURONTIN) 100 MG capsule Take two capsules three times daily. 180 capsule 5   lamoTRIgine (LAMICTAL) 200 MG tablet TAKE 1 TABLET BY MOUTH EVERY DAY 90 tablet 1   lamoTRIgine (LAMICTAL) 25 MG tablet Take  two tablets in the morning. 60 tablet 5   zolpidem (AMBIEN CR) 12.5 MG CR tablet Take 1 tablet (12.5 mg total) by mouth at bedtime as needed for sleep. 30 tablet 2   fluorouracil (EFUDEX) 5 % cream Apply 1 application topically 2 (two) times daily.     ondansetron (ZOFRAN) 4 MG tablet Take 1 tablet (4 mg total) by mouth every 8 (eight) hours as needed for nausea or vomiting. 20 tablet 3   rizatriptan (MAXALT-MLT) 10 MG disintegrating tablet Take 1 tablet (10 mg total) by mouth as needed for migraine. May repeat in 2 hours if needed 9 tablet 11   zolpidem (AMBIEN) 10 MG tablet Take 1 tablet (10 mg total) by mouth at bedtime as needed for sleep. 30 tablet 0   No facility-administered medications prior to visit.    PAST MEDICAL HISTORY: Past Medical History:  Diagnosis Date   Anxiety    Basal cell carcinoma (BCC) of face    Bell's palsy    H/O   Bipolar 1 disorder (HCC)    Depression    Headache    MIGRAINES   History of genital warts     PAST SURGICAL HISTORY: Past Surgical History:  Procedure Laterality Date   BREAST BIOPSY Right 07/03/2018   [place] bx           heart marker              path pending   BREAST CYST ASPIRATION Left 2014   by Dr. Katrinka Blazing   LAPAROSCOPIC TUBAL LIGATION Bilateral 11/10/2016   Procedure: LAPAROSCOPIC TUBAL LIGATION;  Surgeon: Christeen Douglas, MD;  Location: [facility] ORS;  Service: Gynecology;  Laterality: Bilateral;   MOHS SURGERY     TONSILLECTOMY AND ADENOIDECTOMY     tubes in right ear      FAMILY HISTORY: Family History  Problem Relation Age of Onset   Diabetes Maternal Grandfather    Breast cancer Paternal Grandmother 75   Colon cancer Paternal Grandfather 90   Pancreatic cancer Paternal Grandfather    Depression Mother    Depression Father    Lung cancer Maternal Aunt     SOCIAL HISTORY: Social History   Socioeconomic History   Marital status: Married    Spouse name: Maisie Fus   Number of children: 1   Years of education: Grad school    Highest education level: Bachelor's degree (e.g., BA, AB, BS)  Occupational History    Employer: [employer]   Occupation: [employer]  Tobacco Use   Smoking status: Never   Smokeless tobacco: Never  Vaping Use  Vaping status: Never Used  Substance and Sexual Activity   Alcohol use: Not Currently    Alcohol/week: 1.0 standard drink of alcohol    Types: 1 Standard drinks or equivalent per week    Comment: 1-2 times a month   Drug use: No   Sexual activity: Yes    Partners: Male    Birth control/protection: Surgical  Other Topics Concern   Not on file  Social History Narrative   10/13/19   From: [place], [place]   Living: with husband, Maisie Fus [place]) - 2005   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      Family: Control and instrumentation engineer (2008)      Enjoys: watch TV - [place]      Exercise: not currently   Diet: not great - meat - starch veggie, fast food - twice daily      Safety   Seat belts: Yes    Guns: Yes  and secure   Safe in relationships: Yes    Social Determinants of Health   Financial Resource Strain: Not on file  Food Insecurity: Not on file  Transportation Needs: Not on file  Physical Activity: Not on file  Stress: Not on file  Social Connections: Not on file  Intimate Partner Violence: Not on file   PHYSICAL EXAM  Vitals:   10/18/22 0747  BP: 112/80  Pulse: (!) 107  Weight: 213 lb (96.6 kg)  Height: 5\' 8"  (1.727 m)   Body mass index is 32.39 kg/m.  Generalized: Well developed, in no acute distress, wearing sunglasses Neurological examination  Mentation: Alert oriented to time, place, history taking. Follows all commands speech and language fluent Cranial nerve II-XII: Pupils were equal round reactive to light. Extraocular movements were full, visual field were full on confrontational test. Facial sensation and strength were normal. Head turning and shoulder shrug were normal and symmetric. Motor: The motor testing reveals 5 over 5 strength of all 4 extremities.  Good symmetric motor tone is noted throughout.  Sensory: Reports decreased sensation to soft touch to left arm Coordination: Cerebellar testing reveals good finger-nose-finger and heel-to-shin bilaterally.  Gait and station: Gait is normal.  Reflexes: Deep tendon reflexes are symmetric and normal bilaterally.   DIAGNOSTIC DATA (LABS, IMAGING, TESTING) - I reviewed patient records, labs, notes, testing and imaging myself where available.  Lab Results  Component Value Date   WBC 5.6 02/20/2022   HGB 14.3 02/20/2022   HCT 43.5 02/20/2022   MCV 90.9 02/20/2022   PLT 311.0 02/20/2022      Component Value Date/Time   NA 140 07/08/2021 0904   K 3.8 07/08/2021 0904   CL 106 07/08/2021 0904   CO2 28 07/08/2021 0904   GLUCOSE 76 07/08/2021 0904   BUN 10 07/08/2021 0904   CREATININE 0.90 07/08/2021 0904   CALCIUM 9.4 07/08/2021 0904   PROT 6.9 07/08/2021 0904   ALBUMIN 4.2 07/08/2021 0904   AST 12 07/08/2021 0904   ALT 10 07/08/2021 0904   ALKPHOS 60 07/08/2021 0904   BILITOT 0.4 07/08/2021 0904   GFRNONAA >60 02/18/2017 1320   GFRAA >60 02/18/2017 1320   Lab Results  Component Value Date   CHOL 172 07/08/2021   HDL 51.20 07/08/2021   LDLCALC 102 (H) 07/08/2021   TRIG 92.0 07/08/2021   CHOLHDL 3 07/08/2021   Lab Results  Component Value Date   HGBA1C 5.4 07/08/2021   Lab Results  Component Value Date   VITAMINB12 225 02/20/2022   Lab Results  Component  Value Date   TSH 2.80 02/20/2022   Margie Ege, AGNP-C, DNP 10/18/2022, 8:15 AM [facility] [address] [place], [place] [address] [phone]

## 2022-10-18 ENCOUNTER — Encounter: Payer: Self-pay | Admitting: Neurology

## 2022-10-18 ENCOUNTER — Ambulatory Visit: Payer: BC Managed Care – PPO | Admitting: Neurology

## 2022-10-18 VITALS — BP 112/80 | HR 107 | Ht 68.0 in | Wt 213.0 lb

## 2022-10-18 DIAGNOSIS — G43009 Migraine without aura, not intractable, without status migrainosus: Secondary | ICD-10-CM | POA: Diagnosis not present

## 2022-10-18 DIAGNOSIS — F419 Anxiety disorder, unspecified: Secondary | ICD-10-CM | POA: Diagnosis not present

## 2022-10-18 DIAGNOSIS — F319 Bipolar disorder, unspecified: Secondary | ICD-10-CM

## 2022-10-18 MED ORDER — AJOVY 225 MG/1.5ML ~~LOC~~ SOAJ: 1.0000 | SUBCUTANEOUS | Status: DC

## 2022-10-18 MED ORDER — ONDANSETRON HCL 4 MG PO TABS: 4.0000 mg | ORAL_TABLET | Freq: Three times a day (TID) | ORAL | 1 refills | Status: DC | PRN

## 2022-10-18 MED ORDER — RIZATRIPTAN BENZOATE 10 MG PO TBDP: 10.0000 mg | ORAL_TABLET | ORAL | 11 refills | Status: DC | PRN

## 2022-10-18 MED ORDER — AJOVY 225 MG/1.5ML ~~LOC~~ SOAJ: 225.0000 mg | SUBCUTANEOUS | 11 refills | Status: DC

## 2022-10-18 MED ORDER — NURTEC 75 MG PO TBDP: ORAL_TABLET | ORAL | Status: DC

## 2022-10-18 NOTE — Patient Instructions (Addendum)
Start Ajovy once a month injection for migraine prevention  Try Nurtec sample for acute migraine, take 1 tablet at onset of headache, max is 1 tablet in 24 hours. If you like it, I'll send a rx Continue the Maxalt as needed for acute headahce Stop the Excedrin, BC, concern for rebound headache  See you back in 6 months, my chart if you need me sooner  Meds ordered this encounter  Medications   Fremanezumab-vfrm (AJOVY) 225 MG/1.5ML SOAJ    Sig: Inject 225 mg into the skin every 30 (thirty) days.    Dispense:  1.68 mL    Refill:  11   rizatriptan (MAXALT-MLT) 10 MG disintegrating tablet    Sig: Take 1 tablet (10 mg total) by mouth as needed for migraine. May repeat in 2 hours if needed    Dispense:  9 tablet    Refill:  11   ondansetron (ZOFRAN) 4 MG tablet    Sig: Take 1 tablet (4 mg total) by mouth every 8 (eight) hours as needed for nausea or vomiting.    Dispense:  20 tablet    Refill:  1

## 2022-11-17 ENCOUNTER — Encounter: Payer: Self-pay | Admitting: Adult Health

## 2022-11-17 ENCOUNTER — Ambulatory Visit: Payer: BC Managed Care – PPO | Admitting: Adult Health

## 2022-11-17 DIAGNOSIS — F319 Bipolar disorder, unspecified: Secondary | ICD-10-CM | POA: Diagnosis not present

## 2022-11-17 DIAGNOSIS — G4700 Insomnia, unspecified: Secondary | ICD-10-CM | POA: Diagnosis not present

## 2022-11-17 DIAGNOSIS — F411 Generalized anxiety disorder: Secondary | ICD-10-CM

## 2022-11-17 DIAGNOSIS — F331 Major depressive disorder, recurrent, moderate: Secondary | ICD-10-CM

## 2022-11-17 MED ORDER — CARIPRAZINE HCL 3 MG PO CAPS
3.0000 mg | ORAL_CAPSULE | Freq: Every day | ORAL | 2 refills | Status: DC
Start: 2022-11-17 — End: 2023-04-08

## 2022-11-17 MED ORDER — ALPRAZOLAM 1 MG PO TABS
ORAL_TABLET | ORAL | 2 refills | Status: DC
Start: 2022-11-17 — End: 2023-01-26

## 2022-11-17 MED ORDER — ZOLPIDEM TARTRATE ER 12.5 MG PO TBCR: 12.5000 mg | EXTENDED_RELEASE_TABLET | Freq: Every evening | ORAL | 2 refills | Status: DC | PRN

## 2022-11-17 NOTE — Progress Notes (Signed)
Brittany Davila [id number] [birth year] 47 y.o.  Subjective:   Patient ID:  Brittany Davila is a 47 y.o. (DOB Dec 20, 1975) female.  Chief Complaint: No chief complaint on file.   HPI [name] presents to the office today for follow-up of GAD, BPD, and insomnia   Describes mood today as "not too good". Pleasant. Reports tearfulness. Mood symptoms - reports increased anxiety and stress. Reports depression - wanting to stay in the bed all the time - missing work. Denies irritability. Reports panic attacks. Reports some worry, rumination, and over thinking. Mood is lower - denies mood swings. Feels like medications are helpful, but is willing to consider other options with mood decline. Stating "I don't feel like I'm doing as well". Improved interest and motivation. Take medications as prescribed.  Energy levels lower. Active, does not a regular exercise routine.  Enjoys some usual interests and activities. Married. Lives with husband and old daughter. Spending time with family. Appetite adequate. Weight loss - 209 pounds. Sleep is consistent. Averages 5 to 6 hours a night. Reports focus and concentration difficulties - "I can't focus". Completing tasks. Managing aspects of household. 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 Denies SI or HI.  Denies AH or VH. Denies self harm. Denies substance use.     GAD-7    Flowsheet Row Office Visit from 03/15/2022 in [facility] Visit from 07/08/2021 in [facility] Visit from 10/13/2019 in [facility]  Total GAD-7 Score 16 12 7       PHQ2-9    Flowsheet Row Office Visit from 03/15/2022 in [facility] at [facility] Office Visit from 02/20/2022 in [facility] at [facility] Office Visit from 07/08/2021 in [facility] Visit from 10/13/2019 in [facility] at [facility] Visit from 11/19/2017 in [facility] at [facility]  PHQ-2 Total Score 4 4 2 2  0  PHQ-9 Total Score 11 11 12 11  --        Review of Systems:  Review of Systems  Musculoskeletal:  Negative for gait problem.  Neurological:  Negative for tremors.  Psychiatric/Behavioral:         Please refer to HPI    Medications: I have reviewed the patient's current medications.  Current Outpatient Medications  Medication Sig Dispense Refill   ALPRAZolam (XANAX) 1 MG tablet TAKE ONE HALF TABLET BY MOUTH 2 TIMES DAILY 30 tablet 2   cariprazine (VRAYLAR) 1.5 MG capsule Take 1 capsule (1.5 mg total) by mouth daily. 30 capsule 5   Fremanezumab-vfrm (AJOVY) 225 MG/1.5ML SOAJ Inject 225 mg into the skin every 30 (thirty) days. 1.68 mL 11   Fremanezumab-vfrm (AJOVY) 225 MG/1.5ML SOAJ Inject 1 Pen into the skin every 30 (thirty) days.     gabapentin (NEURONTIN) 100 MG capsule Take two capsules three times daily. 180 capsule 5   lamoTRIgine (LAMICTAL) 200 MG tablet TAKE 1 TABLET BY MOUTH EVERY DAY 90 tablet 1   lamoTRIgine (LAMICTAL) 25 MG tablet Take two tablets in the morning. 60 tablet 5   ondansetron (ZOFRAN) 4 MG tablet Take 1 tablet (4 mg total) by mouth every 8 (eight) hours as needed for nausea or vomiting. 20 tablet 1   Rimegepant Sulfate (NURTEC) 75 MG TBDP Take one at migraine onset.     rizatriptan (MAXALT-MLT) 10 MG disintegrating tablet  Take 1 tablet (10 mg total) by mouth as needed for migraine. May repeat in 2 hours if needed 9 tablet 11   zolpidem (AMBIEN CR) 12.5 MG CR tablet Take 1 tablet (12.5 mg total) by mouth at bedtime as needed for sleep. 30 tablet 2   No current facility-administered medications for this visit.    Medication Side Effects: None  Allergies:  Allergies  Allergen Reactions   Pollen Extract Other (See Comments)   Progesterone Other (See Comments)    Worsening depression   Soy Allergy Nausea Only and Other (See  Comments)    intolerance  Other Reaction(s): Other (See Comments)    Past Medical History:  Diagnosis Date   Anxiety    Basal cell carcinoma (BCC) of face    Bell's palsy    H/O   Bipolar 1 disorder (HCC)    Depression    Headache    MIGRAINES   History of genital warts     Past Medical History, Surgical history, Social history, and Family history were reviewed and updated as appropriate.   Please see review of systems for further details on the patient's review from today.   Objective:   Physical Exam:  LMP 08/08/2021 Comment: Menopause stages  Physical Exam Constitutional:      General: She is not in acute distress. Musculoskeletal:        General: No deformity.  Neurological:     Mental Status: She is alert and oriented to person, place, and time.     Coordination: Coordination normal.  Psychiatric:        Attention and Perception: Attention and perception normal. She does not perceive auditory or visual hallucinations.        Mood and Affect: Mood is not depressed. Affect is not labile, blunt, angry or inappropriate.        Speech: Speech normal.        Behavior: Behavior normal.        Thought Content: Thought content normal. Thought content is not paranoid or delusional. Thought content does not include homicidal or suicidal ideation. Thought content does not include homicidal or suicidal plan.        Cognition and Memory: Cognition and memory normal.        Judgment: Judgment normal.     Comments: Insight intact     Lab Review:     Component Value Date/Time   NA 140 07/08/2021 0904   K 3.8 07/08/2021 0904   CL 106 07/08/2021 0904   CO2 28 07/08/2021 0904   GLUCOSE 76 07/08/2021 0904   BUN 10 07/08/2021 0904   CREATININE 0.90 07/08/2021 0904   CALCIUM 9.4 07/08/2021 0904   PROT 6.9 07/08/2021 0904   ALBUMIN 4.2 07/08/2021 0904   AST 12 07/08/2021 0904   ALT 10 07/08/2021 0904   ALKPHOS 60 07/08/2021 0904   BILITOT 0.4 07/08/2021 0904   GFRNONAA  >60 02/18/2017 1320   GFRAA >60 02/18/2017 1320       Component Value Date/Time   WBC 5.6 02/20/2022 0847   RBC 4.79 02/20/2022 0847   HGB 14.3 02/20/2022 0847   HCT 43.5 02/20/2022 0847   PLT 311.0 02/20/2022 0847   MCV 90.9 02/20/2022 0847   MCH 30.6 02/18/2017 1320   MCHC 32.8 02/20/2022 0847   RDW 13.1 02/20/2022 0847   LYMPHSABS 2.2 02/20/2022 0847   MONOABS 0.5 02/20/2022 0847   EOSABS 0.1 02/20/2022 0847   BASOSABS 0.0 02/20/2022 0847    No  results found for: "POCLITH", "LITHIUM"   No results found for: "PHENYTOIN", "PHENOBARB", "VALPROATE", "CBMZ"   .res Assessment: Plan:    Plan:  Gabapentin 200mg  TID  Ambien CR 12.5mg  at hs  Xanax 1mg  - 1/2 tab BID  Lamictal 200mg  at hs Lamictal 25 - 2 every morning.  Increase Vraylar 1.5mg  to 3mg  daily  Restarted magnesium  RTC 3 months  Continue FMLA - will bring in paperwork.  Wrote letter for work testing accommodations.  Time spent with patient was 20 minutes. Greater than 50% of face to face time with patient was spent on counseling and coordination of care.    Patient advised to contact office with any questions, adverse effects, or acute worsening in signs and symptoms.  Discussed potential benefits, risk, and side effects of benzodiazepines to include potential risk of tolerance and dependence, as well as possible drowsiness.  Advised patient not to drive if experiencing drowsiness and to take lowest possible effective dose to minimize risk of dependence and tolerance.  Discussed potential metabolic side effects associated with atypical antipsychotics, as well as potential risk for movement side effects. Advised pt to contact office if movement side effects occur.  There are no diagnoses linked to this encounter.   Please see After Visit Summary for patient specific instructions.  Future Appointments  Date Time Provider Department Center  11/17/2022  8:40 AM Cabella Kimm, Thereasa Solo, NP [facility] None   05/22/2023  7:45 AM Glean Salvo, NP [facility] None    No orders of the defined types were placed in this encounter.   -------------------------------

## 2022-11-27 ENCOUNTER — Ambulatory Visit: Payer: BC Managed Care – PPO | Admitting: Psychiatry

## 2022-11-27 DIAGNOSIS — F319 Bipolar disorder, unspecified: Secondary | ICD-10-CM | POA: Diagnosis not present

## 2022-11-27 NOTE — Progress Notes (Signed)
[facility] Counselor Initial Adult Exam  Name: Brittany Davila Date: 11/27/2022 MRN: [id number] DOB: March 28, 1975 PCP: Mort Sawyers, FNP  Time spent: 63 minutes   Guardian/Payee:  patient    Paperwork requested:  No   Reason for Visit /Presenting Problem: anxiety, depression, some mood swing but not as "bad as they used to be", I dwell on things and worry about the future, don't sleep well as I can't shut my mind off, misses some work weekly, negative self-talk and thoughts of self, anger issues, "prior issues with 81 yr old daughter but we are better", problems with impulse control, financial issues "but husband does not know'  Mental Status Exam:    Appearance:   Neat     Behavior:  Appropriate, Sharing, and Motivated  Motor:  Normal  Speech/Language:   Clear and Coherent  Affect:  Depressed and anxious  Mood:  anxious, depressed, and irritable  Thought process:  goal directed  Thought content:    Rumination  Sensory/Perceptual disturbances:    WNL  Orientation:  oriented to person, place, time/date, situation, day of week, month of year, year, and stated date of Oct. 28, 2024  Attention:  Fair  Concentration:  Fair  Memory:  WNL Adds later "but I forget some things in past"  Fund of knowledge:   Fair  Insight:    Good and Fair  Judgment:   Good  Impulse Control:  Fair and Poor   Reported Symptoms:  see symptoms above  Risk Assessment: Danger to Self:  No Self-injurious Behavior: No Danger to Others: No Duty to Warn:no Physical Aggression / Violence:No  Access to Firearms a concern: No  Gang Involvement:No  Patient / guardian was educated about steps to take if suicide or homicide risk level increases between visits: denies any SI or HI. While future psychiatric events cannot be accurately predicted, the patient does not currently require acute inpatient psychiatric care and does not currently meet [facility] involuntary commitment criteria.  Substance Abuse  History: Current substance abuse: No     Past Psychiatric History:   Previous psychological history is significant for anxiety, depression, and bipolar disorder Outpatient Providers:Regina Mozingo, DNP History of Psych Hospitalization: No  Psychological Testing:  n/a    Abuse History: Victim of No.,  n/a    Report needed: No. Victim of Neglect:No. Perpetrator of  n/a   Witness / Exposure to Domestic Violence: No   Protective Services Involvement: No  Witness to [employer] Violence:  No   Family History:  Patient confirms info below. Family History  Problem Relation Age of Onset   Diabetes Maternal Grandfather    Breast cancer Paternal Grandmother 34   Colon cancer Paternal Grandfather 30   Pancreatic cancer Paternal Grandfather    Depression Mother    Depression Father    Lung cancer Maternal Aunt     Living situation: the patient lives with their spouse,age 27, and 1 yr old daughter. Live in [place], [place]. Married 18 yrs, dated 9 yrs before marrying.  Sexual Orientation:  Straight  Relationship Status: married  Name of spouse / other:n/a             If a parent, number of children / ages:16 yr old daughter  Support Systems; mom is supportive, 1 or 2 friends, husband not supportive  Financial Stress:  Yes   Income/Employment/Disability: Employment  Financial planner: No   Educational History: Education: some college/ later corrected that she stopped school while in grad school and did  not return to school  Religion/Sprituality/World View:    none  Any cultural differences that may affect / interfere with treatment:  not applicable   Recreation/Hobbies: TV, volleyball league  Stressors:Other: ruminates over not completing my master's degree    Strengths:  Supportive Relationships, Family, Friends, Spirituality, Hopefulness, Self Advocate, and Able to Communicate Effectively  Barriers:  "I don't like homework in therapy". "I don't try to change much." "But I'm  motivated anyway to be here."  Legal History: Pending legal issue / charges: The patient has no significant history of legal issues. History of legal issue / charges:  none  Medical History/Surgical History:Patient confirms info below. Past Medical History:  Diagnosis Date   Anxiety    Basal cell carcinoma (BCC) of face    Bell's palsy    H/O   Bipolar 1 disorder (HCC)    Depression    Headache    MIGRAINES   History of genital warts     Past Surgical History:  Procedure Laterality Date   BREAST BIOPSY Right 07/03/2018   [place] bx           heart marker              path pending   BREAST CYST ASPIRATION Left 2014   by Dr. Katrinka Blazing   LAPAROSCOPIC TUBAL LIGATION Bilateral 11/10/2016   Procedure: LAPAROSCOPIC TUBAL LIGATION;  Surgeon: Christeen Douglas, MD;  Location: [facility] ORS;  Service: Gynecology;  Laterality: Bilateral;   MOHS SURGERY     TONSILLECTOMY AND ADENOIDECTOMY     tubes in right ear      Medications: Current Outpatient Medications  Medication Sig Dispense Refill   ALPRAZolam (XANAX) 1 MG tablet TAKE ONE HALF TABLET BY MOUTH 2 TIMES DAILY 30 tablet 2   cariprazine (VRAYLAR) 3 MG capsule Take 1 capsule (3 mg total) by mouth daily. 30 capsule 2   Fremanezumab-vfrm (AJOVY) 225 MG/1.5ML SOAJ Inject 225 mg into the skin every 30 (thirty) days. 1.68 mL 11   Fremanezumab-vfrm (AJOVY) 225 MG/1.5ML SOAJ Inject 1 Pen into the skin every 30 (thirty) days.     gabapentin (NEURONTIN) 100 MG capsule Take two capsules three times daily. 180 capsule 5   lamoTRIgine (LAMICTAL) 200 MG tablet TAKE 1 TABLET BY MOUTH EVERY DAY 90 tablet 1   lamoTRIgine (LAMICTAL) 25 MG tablet Take two tablets in the morning. 60 tablet 5   ondansetron (ZOFRAN) 4 MG tablet Take 1 tablet (4 mg total) by mouth every 8 (eight) hours as needed for nausea or vomiting. 20 tablet 1   Rimegepant Sulfate (NURTEC) 75 MG TBDP Take one at migraine onset.     rizatriptan (MAXALT-MLT) 10 MG disintegrating tablet Take 1  tablet (10 mg total) by mouth as needed for migraine. May repeat in 2 hours if needed 9 tablet 11   zolpidem (AMBIEN CR) 12.5 MG CR tablet Take 1 tablet (12.5 mg total) by mouth at bedtime as needed for sleep. 30 tablet 2   No current facility-administered medications for this visit.    Allergies  Allergen Reactions   Pollen Extract Other (See Comments)   Progesterone Other (See Comments)    Worsening depression   Soy Allergy Nausea Only and Other (See Comments)    intolerance  Other Reaction(s): Other (See Comments)    Diagnoses:    ICD-10-CM   1. Bipolar I disorder (HCC)  F31.9      Treatment goal plan of care: Patient in today for initial session and we  together completed her treatment goal plan of care.  Goals will remain on her treatment plan as she works with strategies in sessions and outside of sessions to meet her goals.  Progress is assessed each session and documented in the "plan" or "progress" sections of treatment note.  1.Reduce overall level, frequency, and intensity of the anxiety so that daily functioning is not impaired. 2.Identify the major life conflicts from the past and present that form the basis for present anxiety. 3.Reinforce client's insights into the role of her past emotional pain and present anxiety.   Plan of Care:    Today is for session for patient with this therapist.  Britain N.  Criss is a 47 year old married, employed, mother of a 62 yr old daughter. She was referred for therapy by her med provider to work on her symptoms associated with her anxiety and bipolar disorder.  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  She reports the work environment is a good 1 and she has helpful coworkers, although she does not feel very motivated.  Has struggled in other jobs with the same issue.  Patient is very pleasant in our time together today and seems to be very honest even on difficult  issues.  She was in therapy "a while back but I stopped because I did not like always being asked to do homework and therapy".  Discussed this issue with patient and finally able to agree that we may use occasional homework as a tool but it is not something that is assigned every session.  However there are expected behaviors, for example new behaviors to try between sessions and patient is very agreeable to that.  Patient participates as a Psychologist, occupational in a Community education officer which gives her some good interaction with others.  States that her strengths are a sense of hopefulness, a sense of spirituality, some of her family (husband not very supportive), and her ability to communicate effectively.  She acknowledges upfront that she does not "try to change very much" but adds that "I am here today" and want to be more motivated.  Discussed at length a lot of the obstacles from her past as well as her present.  Feels that the relationship with husband is a challenge as "there is no emotion and we are like roommates".  Reports feeling that she should have dealt with things and fix them 20 years ago.  Not all details included in her note as she is sensitive to information being exposed, and I will honor that.  By end of our time today, she reported feeling some more hopeful and stated "I want to be here as I want to get better".  Reports some previous manic episodes that used to be really bad, "but not so bad now".  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  Her parents are living but they are divorced.  Talks with mom weekly and dad at holidays.  Has 1 sister and one half sister, some contact with her sister and rare contact with half-sister.  Reports that she has 1 friend that she talks to about everything and "another friend that is someone I can talk with about some things. "  When I asked what she really wanted most  about of therapy she stated: "I want to be  less over apologetic, to be more consistent and work attendance, to be less self doubtful and everything I do, to stop self sabotaging, to manage anxiety better, to let go of past regrets and things I did or did not do and let go of them as I cannot change the now, and 1 that seemed very important to her was the last 1 which was "I want to let go of my first mental breakdown when I quit school at [employer] and later returned at [employer] to finish her undergraduate and graduate education.  Based on past history and therapy, this patient did a really good job today in talking through some issues that were very tender for her and for which seem to hold some shame.  She does seem motivated although questions herself.  Reviewed some of the session with her today and completed her initial treatment goal plan.   Review of initial treatment goal plan and patient is in agreement.  Next appointment within 1-2 weeks.   Mathis Fare, LCSW

## 2022-11-28 ENCOUNTER — Ambulatory Visit: Payer: BC Managed Care – PPO | Admitting: Adult Health

## 2022-12-04 ENCOUNTER — Ambulatory Visit: Payer: BC Managed Care – PPO | Admitting: Psychiatry

## 2022-12-04 DIAGNOSIS — F319 Bipolar disorder, unspecified: Secondary | ICD-10-CM

## 2022-12-04 NOTE — Progress Notes (Signed)
[facility] Counselor/Therapist Progress Note  Patient ID: Brittany Davila, MRN: [id number],    Date: 12/04/2022  Time Spent: 60 minutes   Treatment Type: Individual Therapy  Reported Symptoms:  anxiety, depression, some mood fluctuations "but not as bad as previously", acknowledging again today that "she has not really tried hard to make changes as she did not believe in herself enough and lacked motivation.  Was also "turned off" by previous therapists assigning homework as she did not feel that was helpful for her.  States that she did feel more motivated in her initial evaluation session recently and wants to hold onto that.   Mental Status Exam:  Appearance:   Neat and Well Groomed     Behavior:  Appropriate, Sharing, and Motivated  Motor:  Normal  Speech/Language:   Clear and Coherent  Affect:  Depressed and anxious  Mood:  anxious and depressed  Thought process:  goal directed  Thought content:    Rumination and some obsessive thoughts  Sensory/Perceptual disturbances:    WNL  Orientation:  oriented to person, place, time/date, situation, day of week, month of year, year, and stated date of Nov. 4, 2024  Attention:  Fair  Concentration:  Fair  Memory:  Some short term memory issues  Fund of knowledge:   Good and Fair  Insight:    Good and Fair  Judgment:   Good  Impulse Control:  Good and Fair   Risk Assessment: Danger to Self:  No Self-injurious Behavior: No Danger to Others: No Duty to Warn:no Physical Aggression / Violence:No  Access to Firearms a concern: No  Gang Involvement:No   Subjective:  Patient in today after having had her initial session recently. Added she has healthy close relationship with mom (age 1). Today wanting/needing to work on following through on behaviors and changes, some mood swings at times, stopping negative thoughts and negative behavior, reduce her worrying excessively about the future, resolve problems with impulse control, and  believe more in Herself and stop comparing self to others. Uses "crying to help me manage my self-doubt" and looked at way of managing her beliefs about herself and starting to change them to be more supportive and less judgmental. Trying to practice more positive self talk and self-care. Does show increased motivation today.  To feel more connected and motivated today and shows evidence of some follow-through already from what we talked about last session and her initial evaluation.  Interventions: Cognitive Behavioral Therapy and Ego-Supportive  1.Reduce overall level, frequency, and intensity of the anxiety so that daily functioning is not impaired. 2.Identify the major life conflicts from the past and present that form the basis for present anxiety. 3.Reinforce client's insights into the role of her past emotional pain and present anxiety.   Diagnosis:   ICD-10-CM   1. Bipolar I disorder (HCC)  F31.9      Plan:  Patient in today following up after having completed her initial evaluation in which she seemed to surprise herself and being so open and willing to talk in the first session about circumstances and behaviors that she feels some shame around.  In that initial session, she seemed to be more open and honest than what she reported she normally is and was able to share some helpful information as well as that a lot of emotions and receive some support and more hope for her future.  Working on obstacles from her past that she feels are holding her back now  and has not had much hope previously for moving forward.  Does seem to have and speak of a little more hope at this point although she realizes she has a lot of emotional issues to work on to move forward.  Encouraged patient in practicing more positive and self affirming behaviors including: Refrain from assuming negatives, stay in the present focusing on what she can control or change, remain in touch with people who are supportive, look for  more positives versus negatives daily, remain on her prescribed medication, healthy nutrition and exercise, practice "the pause" as needed, use of daily affirmations, refrain from self negating, reduce overthinking and over analyzing, challenge and counteract her self-doubt, interrupt negative/anxious thoughts and challenge them to replace with more reality based thoughts, saying no without feeling guilty, letting go of things including guilt from the past that can hold her back now as discussed in sessions, look intentionally for things that give her hope, self-assess her functioning occasionally and look for the positives, and recognize the strength she shows working with goal-directed behaviors to move in a direction that supports increased confidence, improved emotional health, and her overall wellbeing.  The way patient has responded in her first visit, she seems to want to make progress although has not had a lot of belief in herself.  She does need to continue working with goal-directed behaviors in order to move forward in a more hopeful and healthier direction.  Goal review and progress/challenges noted with patient.  Next appointment within 1 to 2 weeks.   Mathis Fare, LCSW

## 2022-12-11 ENCOUNTER — Ambulatory Visit: Payer: BC Managed Care – PPO | Admitting: Psychiatry

## 2022-12-15 ENCOUNTER — Ambulatory Visit: Payer: BC Managed Care – PPO | Admitting: Adult Health

## 2022-12-15 ENCOUNTER — Encounter: Payer: Self-pay | Admitting: Adult Health

## 2022-12-15 DIAGNOSIS — F319 Bipolar disorder, unspecified: Secondary | ICD-10-CM

## 2022-12-15 DIAGNOSIS — G4700 Insomnia, unspecified: Secondary | ICD-10-CM

## 2022-12-15 DIAGNOSIS — F411 Generalized anxiety disorder: Secondary | ICD-10-CM

## 2022-12-15 NOTE — Progress Notes (Signed)
Brittany Davila [id number] Jul 10, 1975 47 y.o.  Subjective:   Patient ID:  Brittany Davila is a 47 y.o. (DOB 06-03-1975) female.  Chief Complaint: No chief complaint on file.   HPI Brittany Davila presents to the office today for follow-up of GAD, BPD, and insomnia   Describes mood today as "not good". Pleasant. Reports tearfulness. Mood symptoms - reports increased anxiety and stress. Reports decreased depression - "not like it was, but still there". Denies irritability. Reports panic attacks. Reports some worry, rumination, and over thinking. Denies mood swings. Mood has improved. Feels like medications are helpful. Stating "I feel like I'm doing better with the depression. Improved interest and motivation. Take medications as prescribed.  Energy levels improved. Active, does not a regular exercise routine.  Enjoys some usual interests and activities. Married. Lives with husband and old daughter. Spending time with family. Appetite adequate. Weight loss - 209 pounds. Sleep is consistent. Averages 5 to 6 hours a night. Reports focus and concentration difficulties with lack of sleep. Completing tasks. Managing aspects of household. 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 Denies SI or HI.  Denies AH or VH. Denies self harm. Denies substance use.   GAD-7    Flowsheet Row Office Visit from 03/15/2022 in [facility] Visit from 07/08/2021 in [facility] Visit from 10/13/2019 in [facility]  Total GAD-7 Score 16 12 7       PHQ2-9    Flowsheet Row Office Visit from 03/15/2022 in [facility] Office Visit from 02/20/2022 in [facility] Office Visit from 07/08/2021 in [facility] Visit from 10/13/2019 in [facility] Visit from 11/19/2017 in  [facility]  PHQ-2 Total Score 4 4 2 2  0  PHQ-9 Total Score 11 11 12 11  --        Review of Systems:  Review of Systems  Musculoskeletal:  Negative for gait problem.  Neurological:  Negative for tremors.  Psychiatric/Behavioral:         Please refer to HPI    Medications: I have reviewed the patient's current medications.  Current Outpatient Medications  Medication Sig Dispense Refill   ALPRAZolam (XANAX) 1 MG tablet TAKE ONE HALF TABLET BY MOUTH 2 TIMES DAILY 30 tablet 2   cariprazine (VRAYLAR) 3 MG capsule Take 1 capsule (3 mg total) by mouth daily. 30 capsule 2   Fremanezumab-vfrm (AJOVY) 225 MG/1.5ML SOAJ Inject 225 mg into the skin every 30 (thirty) days. 1.68 mL 11   Fremanezumab-vfrm (AJOVY) 225 MG/1.5ML SOAJ Inject 1 Pen into the skin every 30 (thirty) days.     gabapentin (NEURONTIN) 100 MG capsule Take two capsules three times daily. 180 capsule 5   lamoTRIgine (LAMICTAL) 200 MG tablet TAKE 1 TABLET BY MOUTH EVERY DAY 90 tablet 1   lamoTRIgine (LAMICTAL) 25 MG tablet Take two tablets in the morning. 60 tablet 5   ondansetron (ZOFRAN) 4 MG tablet Take 1 tablet (4 mg total) by mouth every 8 (eight) hours as needed for nausea or vomiting. 20 tablet 1   Rimegepant Sulfate (NURTEC) 75 MG TBDP Take one at migraine onset.     rizatriptan (MAXALT-MLT) 10 MG disintegrating tablet Take 1 tablet (10 mg total) by mouth as needed for migraine. May repeat in 2 hours  if needed 9 tablet 11   zolpidem (AMBIEN CR) 12.5 MG CR tablet Take 1 tablet (12.5 mg total) by mouth at bedtime as needed for sleep. 30 tablet 2   No current facility-administered medications for this visit.    Medication Side Effects: None  Allergies:  Allergies  Allergen Reactions   Pollen Extract Other (See Comments)   Progesterone Other (See Comments)    Worsening depression   Soy Allergy Nausea Only and Other (See Comments)    intolerance  Other Reaction(s): Other (See  Comments)    Past Medical History:  Diagnosis Date   Anxiety    Basal cell carcinoma (BCC) of face    Bell's palsy    H/O   Bipolar 1 disorder (HCC)    Depression    Headache    MIGRAINES   History of genital warts     Past Medical History, Surgical history, Social history, and Family history were reviewed and updated as appropriate.   Please see review of systems for further details on the patient's review from today.   Objective:   Physical Exam:  LMP 08/08/2021 Comment: Menopause stages  Physical Exam Constitutional:      General: She is not in acute distress. Musculoskeletal:        General: No deformity.  Neurological:     Mental Status: She is alert and oriented to person, place, and time.     Coordination: Coordination normal.  Psychiatric:        Attention and Perception: Attention and perception normal. She does not perceive auditory or visual hallucinations.        Mood and Affect: Affect is not labile, blunt, angry or inappropriate.        Speech: Speech normal.        Behavior: Behavior normal.        Thought Content: Thought content normal. Thought content is not paranoid or delusional. Thought content does not include homicidal or suicidal ideation. Thought content does not include homicidal or suicidal plan.        Cognition and Memory: Cognition and memory normal.        Judgment: Judgment normal.     Comments: Insight intact     Lab Review:     Component Value Date/Time   NA 140 07/08/2021 0904   K 3.8 07/08/2021 0904   CL 106 07/08/2021 0904   CO2 28 07/08/2021 0904   GLUCOSE 76 07/08/2021 0904   BUN 10 07/08/2021 0904   CREATININE 0.90 07/08/2021 0904   CALCIUM 9.4 07/08/2021 0904   PROT 6.9 07/08/2021 0904   ALBUMIN 4.2 07/08/2021 0904   AST 12 07/08/2021 0904   ALT 10 07/08/2021 0904   ALKPHOS 60 07/08/2021 0904   BILITOT 0.4 07/08/2021 0904   GFRNONAA >60 02/18/2017 1320   GFRAA >60 02/18/2017 1320       Component Value  Date/Time   WBC 5.6 02/20/2022 0847   RBC 4.79 02/20/2022 0847   HGB 14.3 02/20/2022 0847   HCT 43.5 02/20/2022 0847   PLT 311.0 02/20/2022 0847   MCV 90.9 02/20/2022 0847   MCH 30.6 02/18/2017 1320   MCHC 32.8 02/20/2022 0847   RDW 13.1 02/20/2022 0847   LYMPHSABS 2.2 02/20/2022 0847   MONOABS 0.5 02/20/2022 0847   EOSABS 0.1 02/20/2022 0847   BASOSABS 0.0 02/20/2022 0847    No results found for: "POCLITH", "LITHIUM"   No results found for: "PHENYTOIN", "PHENOBARB", "VALPROATE", "CBMZ"   .res Assessment: Plan:  Diagnoses and all orders for this visit:  Bipolar I disorder (HCC)  Generalized anxiety disorder  Insomnia, unspecified type    Plan:  Gabapentin 200mg  TID  Ambien CR 12.5mg  at hs  Xanax 1mg  - 1/2 tab BID  Lamictal 200mg  at hs Lamictal 25 - 2 every morning.  Vraylar 3mg  daily  Restarted magnesium  RTC 3 months  Continue FMLA - will bring in paperwork.  Wrote letter for work testing accommodations.  Time spent with patient was 20 minutes. Greater than 50% of face to face time with patient was spent on counseling and coordination of care.    Patient advised to contact office with any questions, adverse effects, or acute worsening in signs and symptoms.  Discussed potential benefits, risk, and side effects of benzodiazepines to include potential risk of tolerance and dependence, as well as possible drowsiness.  Advised patient not to drive if experiencing drowsiness and to take lowest possible effective dose to minimize risk of dependence and tolerance.  Discussed potential metabolic side effects associated with atypical antipsychotics, as well as potential risk for movement side effects. Advised pt to contact office if movement side effects occur. Please see After Visit Summary for patient specific instructions.  Future Appointments  Date Time Provider Department Center  12/18/2022  8:00 AM Mathis Fare, LCSW [facility] None  01/01/2023  8:00 AM  Mathis Fare, LCSW [facility] None  05/22/2023  7:45 AM Glean Salvo, NP [facility] None    No orders of the defined types were placed in this encounter.   -------------------------------

## 2022-12-18 ENCOUNTER — Ambulatory Visit: Payer: BC Managed Care – PPO | Admitting: Psychiatry

## 2022-12-18 ENCOUNTER — Other Ambulatory Visit: Payer: Self-pay | Admitting: Obstetrics and Gynecology

## 2022-12-18 DIAGNOSIS — F319 Bipolar disorder, unspecified: Secondary | ICD-10-CM

## 2022-12-18 DIAGNOSIS — Z1231 Encounter for screening mammogram for malignant neoplasm of breast: Secondary | ICD-10-CM

## 2022-12-18 NOTE — Progress Notes (Signed)
[facility] Counselor/Therapist Progress Note  Patient ID: Brittany Davila, MRN: [id number],    Date: 12/18/2022  Time Spent: 55 minutes   Treatment Type: Individual Therapy  Reported Symptoms: anxiety, depression, some mood fluctuation but not quite as bad, motivational issues, not believing in herself, "not thinking ahead".  Ruminating about work and money.   Mental Status Exam:  Appearance:   Neat     Behavior:  Appropriate, Sharing, and trying to be more motivated  Motor:  Normal  Speech/Language:   Clear and Coherent  Affect:  Depressed and anxious  Mood:  anxious and depressed  Thought process:  goal directed  Thought content:    Rumination  Sensory/Perceptual disturbances:    WNL  Orientation:  oriented to person, place, time/date, situation, day of week, month of year, year, and stated date of Nov. 18, 2024  Attention:  Fair  Concentration:  Fair  Memory:  WNL  Fund of knowledge:   Good and Fair  Insight:    Good and Fair  Judgment:   Good  Impulse Control:  Fair   Risk Assessment: Danger to Self:  No Self-injurious Behavior: No Danger to Others: No Duty to Warn:no Physical Aggression / Violence:No  Access to Firearms a concern: No  Gang Involvement:No   Subjective:   Patient in session today focusing on stopping negative thought patterns, mood swings at times, reducing her worrying excessively about the future, negative self-talk, and easy "to feel anxious". Saw her med provider end of last week and meds were changed up a bit and patient feels that is helping some. Support of mom and daughter helps as well. Anxiety worse at night, in the a.m., and at work. "This a.m. I woke up at 5a.m. and was very anxious about work, a test she has to take at work. Working today on interrupting her anxiety more effectively, and trying to be more positive. Very aware of her tendency to self-negating and self-judging, struggling with her motivational issues, and her own self esteem.  Mom, age 15, is big supporter as is her teen daughter.  Spends unnecessarily. Trying to work at night at home to get rid of clutter and I get distracted and hard to get rid of things, and this affects her mood negatively. To work on sorting through and throwing away clutter. Also to journal re: negative, critical thoughts of self, interrupt negative and anxious thoughts ; practice  more positive talk and behaviors as discussed in session in order to be more hopeful, self-caring and establish more belief in herself to make positive and self caring changes.  Is in more debt and that worries her and is trying to cut back on her excessive worrying, trying to refrain from assuming the worst.  Discussed her thinking patterns more in session today and how to work on changing them.  Commits to working on this also between sessions.  Good session and to continue with homework strategies and journaling.  Interventions: Cognitive Behavioral Therapy and Ego-Supportive  1.Reduce overall level, frequency, and intensity of the anxiety so that daily functioning is not impaired. 2.Identify the major life conflicts from the past and present that form the basis for present anxiety. 3.Reinforce client's insights into the role of her past emotional pain and present anxiety.  Diagnosis:   ICD-10-CM   1. Bipolar I disorder (HCC)  F31.9      Plan:   Patient in today and working further on her treatment goals particularly decreasing her  negative self talk, and strategies aimed at helping her reduce the frequency, level, and intensity of her anxiety so that her daily functioning will be less impaired.  Continues to work on obstacles from the past that still hold her back at this point but that has not improved much yet.  Some increased hope.  Encouraged her to be practicing more positive and self affirming behaviors including: Refrain from negative assumptions, stay in the present focusing on what she can control or change, stay  in touch with supportive people, look for more positives versus negatives daily, remain on her prescribed medication, practice "the pause" as needed, use of daily affirmations, refrain from self negating, reduce overthinking and over analyzing, challenge and counteract her self-doubt, interrupt negative/anxious thoughts and challenge them to be more reality based thoughts, saying no without feeling guilty, letting go of guilt from the past that can still hold her back now from moving forward, look intentionally for things that give her hope, self-assess her functioning occasionally and looking more for the positives than negatives, and recognize the strength she shows working with goal-directed behaviors to move in a direction that supports her increased confidence, improved emotional health, and overall wellbeing.  Working to have more belief in herself.  She already shows some good effort and progress.  Needs to continue her work with goal-directed behaviors to move and a more hopeful and healthier direction.  Goal review and progress/challenges noted with patient.  Next appointment within 1 to 2 weeks.   Mathis Fare, LCSW

## 2022-12-31 ENCOUNTER — Other Ambulatory Visit: Payer: Self-pay | Admitting: Adult Health

## 2022-12-31 DIAGNOSIS — G4700 Insomnia, unspecified: Secondary | ICD-10-CM

## 2023-01-01 ENCOUNTER — Ambulatory Visit: Payer: BC Managed Care – PPO | Admitting: Psychiatry

## 2023-01-01 DIAGNOSIS — F319 Bipolar disorder, unspecified: Secondary | ICD-10-CM | POA: Diagnosis not present

## 2023-01-01 NOTE — Progress Notes (Signed)
[facility] Counselor/Therapist Progress Note  Patient ID: Brittany Davila, MRN: [id number],    Date: 01/01/2023  Time Spent: 53 minutes   Treatment Type: Individual Therapy  Reported Symptoms: anxiety, depression decreased and decreased mood fluctuations, showing more motivation, believing more in herself, "not looking ahead", and ruminating about work and money.   Mental Status Exam:  Appearance:   Well Groomed     Behavior:  Appropriate, Sharing, and Motivated  Motor:  Normal  Speech/Language:   Clear and Coherent  Affect:  Anxious and some mood fluctuations  Mood:  anxious  Thought process:  goal directed  Thought content:    Rumination  Sensory/Perceptual disturbances:    WNL  Orientation:  oriented to person, place, time/date, situation, day of week, month of year, year, and stated date of Dec. 2, 2024  Attention:  Good  Concentration:  Fair  Memory:  WNL  Fund of knowledge:   Good  Insight:    Fair  Judgment:   Good and Fair  Impulse Control:  Good and Fair   Risk Assessment: Danger to Self:  No Self-injurious Behavior: No Danger to Others: No Duty to Warn:no Physical Aggression / Violence:No  Access to Firearms a concern: No  Gang Involvement:No   Subjective:  Patient in for appointment today focusing more on interrupting and stopping negative thought patterns, trying to manage mood swings better, negative self talk, reducing her excessive worrying about the future, and trying to feel less anxious. Reporting much less depression more recently, and has been more consistent in taking her meds. The support of her daughter and mother is helping. Followed up on homework re: positive attributes about patient and mom/friend also provided feedback. Some of the positives were: that patient is generous, kind, smart, a good friend, supportive, thoughtful, fun to be with, caring, good sense of humor, hard worker, loyal, considerate, responsible, hard worker, loyal, funny,  compassionate. Did follow up and talk with supervisor last work day and that was helpful. Working to change her mindset as discussed last session. Trying to believe more in herself. Continued support and encouragement provided to patient and she is actually becoming more positive and supportive with herself. Has self-doubt but "not a lot worse.".  Less self-judgment".  More motivated and I think seeing some progress is really helping her.  Did some work recently on getting rid of some clutter at home and realize how that affected her mood positively.  Made some notes on her phone to bring in with her today and to session to share relative to her treatment goals.  Encouraged her to continue journaling and note taking as she also felt this was helpful.  Continued work on changing some of her thinking patterns to be more positive and not assuming "worst-case scenarios".  Showing some improvement in how she views herself.  Interventions: Cognitive Behavioral Therapy and Ego-Supportive  1.Reduce overall level, frequency, and intensity of the anxiety so that daily functioning is not impaired. 2.Identify the major life conflicts from the past and present that form the basis for present anxiety. 3.Reinforce client's insights into the role of her past emotional pain and present anxiety.   Diagnosis:   ICD-10-CM   1. Bipolar I disorder (HCC)  F31.9      Plan:    Patient in session today continuing to work on treatment goals related to decreasing her negative self talk and negative views of herself, as well as strategies to help her reduce the frequency/level/and  intensity of her anxiety so that her daily functioning is not as impaired.  Patient reports that she continues to work on obstacles from her past that still hold her back but she is working to improve this although admits it is difficult.  Does report some sense of increased hope which she feels may be related to the fact she is making efforts to connect  in therapy and work on her issues. Reminded and encouraged patient in her practice of more self affirming and positive behaviors including: Refrain from frequent negative assumptions, remain in the present focusing on what she can control or change, stay in contact with supportive people, look for more positives versus negatives daily, remain on her prescribed medication, practice "the pause" as needed, use of daily affirmations, refrain from self negating, reduce overthinking and over analyzing, challenge and counteract her self-doubt, interrupt negative/anxious thoughts and challenge them to be more reality based thoughts, saying no without feeling guilty, letting go of guilt from the past that can still hold her back from moving forward, look intentionally for things that give her hope, self-assess her functioning occasionally and look more for the positives versus negatives, and realize the strength she shows working with goal-directed behaviors to move in a direction that supports her increased confidence, overall improved emotional health and her outlook into the future.  She is specifically working trying to have more "positive belief" in herself.  Patient demonstrating good effort although changes difficult for her and she acknowledges this.  She has made some progress and needs to continue working with goal-directed behaviors to move in a healthier and more hopeful direction.  Goal review and progress/challenges noted with patient.  Next appt within 2 weeks.   Mathis Fare, LCSW

## 2023-01-10 ENCOUNTER — Ambulatory Visit
Admission: RE | Admit: 2023-01-10 | Discharge: 2023-01-10 | Disposition: A | Discharge status: Home / Self Care | Payer: BC Managed Care – PPO | Source: Ambulatory Visit | Attending: Obstetrics and Gynecology | Admitting: Obstetrics and Gynecology

## 2023-01-10 ENCOUNTER — Ambulatory Visit: Payer: BC Managed Care – PPO | Admitting: Psychiatry

## 2023-01-10 DIAGNOSIS — Z1231 Encounter for screening mammogram for malignant neoplasm of breast: Secondary | ICD-10-CM | POA: Diagnosis present

## 2023-01-12 ENCOUNTER — Ambulatory Visit: Payer: BC Managed Care – PPO | Admitting: Adult Health

## 2023-01-12 ENCOUNTER — Encounter: Payer: Self-pay | Admitting: Adult Health

## 2023-01-12 DIAGNOSIS — F319 Bipolar disorder, unspecified: Secondary | ICD-10-CM | POA: Diagnosis not present

## 2023-01-12 DIAGNOSIS — G4700 Insomnia, unspecified: Secondary | ICD-10-CM | POA: Diagnosis not present

## 2023-01-12 DIAGNOSIS — F411 Generalized anxiety disorder: Secondary | ICD-10-CM | POA: Diagnosis not present

## 2023-01-12 NOTE — Progress Notes (Signed)
Brittany Davila [id number] 1975-08-26 47 y.o.  Subjective:   Patient ID:  Brittany Davila is a 47 y.o. (DOB 1975-10-16) female.  Chief Complaint: No chief complaint on file.   HPI Brittany Davila presents to the office today for follow-up of GAD, BPD, and insomnia   Describes mood today as "better". Pleasant. Reports tearfulness at times. Mood symptoms - reports some depression. Reports anxiety at times. Denies irritability.  Reports panic attacks. Reports some worry, rumination, and over thinking. Denies mood swings. Mood has improved. Feels like medications are helpful. Stating "I feel like I'm doing better than I was". Improved interest and motivation. Take medications as prescribed.  Energy levels improved. Active, does not have a regular exercise routine.  Enjoys some usual interests and activities. Married. Lives with husband and 75 year-old daughter. Spending time with family. Appetite adequate. Weight loss - 209 to 220 pounds. Sleep is more consistent. Averages 6 hours a night. Reports focus and concentration has improved. Completing tasks. Managing aspects of household. 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 Denies SI or HI.  Denies AH or VH. Denies self harm. Denies substance use.    GAD-7    Flowsheet Row Office Visit from 03/15/2022 in [facility] Visit from 07/08/2021 in [facility] Visit from 10/13/2019 in [facility]  Total GAD-7 Score 16 12 7       PHQ2-9    Flowsheet Row Office Visit from 03/15/2022 in [facility] Office Visit from 02/20/2022 in [facility] Office Visit from 07/08/2021 in [facility] Visit from 10/13/2019 in [facility] Visit from 11/19/2017 in [facility] at [facility]   PHQ-2 Total Score 4 4 2 2  0  PHQ-9 Total Score 11 11 12 11  --        Review of Systems:  Review of Systems  Musculoskeletal:  Negative for gait problem.  Neurological:  Negative for tremors.  Psychiatric/Behavioral:         Please refer to HPI    Medications: I have reviewed the patient's current medications.  Current Outpatient Medications  Medication Sig Dispense Refill   ALPRAZolam (XANAX) 1 MG tablet TAKE ONE HALF TABLET BY MOUTH 2 TIMES DAILY 30 tablet 2   cariprazine (VRAYLAR) 3 MG capsule Take 1 capsule (3 mg total) by mouth daily. 30 capsule 2   Fremanezumab-vfrm (AJOVY) 225 MG/1.5ML SOAJ Inject 225 mg into the skin every 30 (thirty) days. 1.68 mL 11   Fremanezumab-vfrm (AJOVY) 225 MG/1.5ML SOAJ Inject 1 Pen into the skin every 30 (thirty) days.     gabapentin (NEURONTIN) 100 MG capsule Take two capsules three times daily. 180 capsule 5   lamoTRIgine (LAMICTAL) 200 MG tablet TAKE 1 TABLET BY MOUTH EVERY DAY 90 tablet 1   lamoTRIgine (LAMICTAL) 25 MG tablet Take two tablets in the morning. 60 tablet 5   ondansetron (ZOFRAN) 4 MG tablet Take 1 tablet (4 mg total) by mouth every 8 (eight) hours as needed for nausea or vomiting. 20 tablet 1   Rimegepant Sulfate (NURTEC) 75 MG TBDP Take one at migraine onset.     rizatriptan (MAXALT-MLT) 10 MG disintegrating tablet Take 1 tablet (10 mg total) by mouth as needed for migraine. May repeat in 2 hours if needed 9 tablet 11  zolpidem (AMBIEN CR) 12.5 MG CR tablet Take 1 tablet (12.5 mg total) by mouth at bedtime as needed for sleep. 30 tablet 2   No current facility-administered medications for this visit.    Medication Side Effects: None  Allergies:  Allergies  Allergen Reactions   Pollen Extract Other (See Comments)   Progesterone Other (See Comments)    Worsening depression   Soy Allergy (Do Not Select) Nausea Only and Other (See Comments)    intolerance  Other Reaction(s): Other (See Comments)    Past Medical  History:  Diagnosis Date   Anxiety    Basal cell carcinoma (BCC) of face    Bell's palsy    H/O   Bipolar 1 disorder (HCC)    Depression    Headache    MIGRAINES   History of genital warts     Past Medical History, Surgical history, Social history, and Family history were reviewed and updated as appropriate.   Please see review of systems for further details on the patient's review from today.   Objective:   Physical Exam:  LMP 08/08/2021 Comment: Menopause stages  Physical Exam Constitutional:      General: She is not in acute distress. Musculoskeletal:        General: No deformity.  Neurological:     Mental Status: She is alert and oriented to person, place, and time.     Coordination: Coordination normal.  Psychiatric:        Attention and Perception: Attention and perception normal. She does not perceive auditory or visual hallucinations.        Mood and Affect: Affect is not labile, blunt, angry or inappropriate.        Speech: Speech normal.        Behavior: Behavior normal.        Thought Content: Thought content normal. Thought content is not paranoid or delusional. Thought content does not include homicidal or suicidal ideation. Thought content does not include homicidal or suicidal plan.        Cognition and Memory: Cognition and memory normal.        Judgment: Judgment normal.     Comments: Insight intact     Lab Review:     Component Value Date/Time   NA 140 07/08/2021 0904   K 3.8 07/08/2021 0904   CL 106 07/08/2021 0904   CO2 28 07/08/2021 0904   GLUCOSE 76 07/08/2021 0904   BUN 10 07/08/2021 0904   CREATININE 0.90 07/08/2021 0904   CALCIUM 9.4 07/08/2021 0904   PROT 6.9 07/08/2021 0904   ALBUMIN 4.2 07/08/2021 0904   AST 12 07/08/2021 0904   ALT 10 07/08/2021 0904   ALKPHOS 60 07/08/2021 0904   BILITOT 0.4 07/08/2021 0904   GFRNONAA >60 02/18/2017 1320   GFRAA >60 02/18/2017 1320       Component Value Date/Time   WBC 5.6 02/20/2022 0847    RBC 4.79 02/20/2022 0847   HGB 14.3 02/20/2022 0847   HCT 43.5 02/20/2022 0847   PLT 311.0 02/20/2022 0847   MCV 90.9 02/20/2022 0847   MCH 30.6 02/18/2017 1320   MCHC 32.8 02/20/2022 0847   RDW 13.1 02/20/2022 0847   LYMPHSABS 2.2 02/20/2022 0847   MONOABS 0.5 02/20/2022 0847   EOSABS 0.1 02/20/2022 0847   BASOSABS 0.0 02/20/2022 0847    No results found for: "POCLITH", "LITHIUM"   No results found for: "PHENYTOIN", "PHENOBARB", "VALPROATE", "CBMZ"   .res Assessment: Plan:    Plan:  Gabapentin 200mg  TID  Ambien CR 12.5mg  at hs  Xanax 1mg  - 1/2 tab BID  Lamictal 200mg  at hs Lamictal 25 - 2 every morning.  Vraylar 3mg  daily  Restarted magnesium  RTC 3 months  Continue FMLA - will bring in paperwork.  Wrote letter for work testing accommodations.  Time spent with patient was 20 minutes. Greater than 50% of face to face time with patient was spent on counseling and coordination of care.    Patient advised to contact office with any questions, adverse effects, or acute worsening in signs and symptoms.  Discussed potential benefits, risk, and side effects of benzodiazepines to include potential risk of tolerance and dependence, as well as possible drowsiness.  Advised patient not to drive if experiencing drowsiness and to take lowest possible effective dose to minimize risk of dependence and tolerance.  Discussed potential metabolic side effects associated with atypical antipsychotics, as well as potential risk for movement side effects. Advised pt to contact office if movement side effects occur. Diagnoses and all orders for this visit:  Bipolar I disorder (HCC)  Generalized anxiety disorder  Insomnia, unspecified type     Please see After Visit Summary for patient specific instructions.  Future Appointments  Date Time Provider Department Center  01/15/2023  8:00 AM Mathis Fare, LCSW [facility] None  01/29/2023  8:00 AM Mathis Fare, LCSW [facility] None   02/06/2023  8:00 AM Mathis Fare, LCSW [facility] None  02/19/2023  9:00 AM Mathis Fare, LCSW [facility] None  03/05/2023  8:00 AM Mathis Fare, LCSW [facility] None  05/22/2023  7:45 AM Glean Salvo, NP [facility] None    No orders of the defined types were placed in this encounter.   -------------------------------

## 2023-01-15 ENCOUNTER — Ambulatory Visit: Payer: BC Managed Care – PPO | Admitting: Psychiatry

## 2023-01-15 DIAGNOSIS — F319 Bipolar disorder, unspecified: Secondary | ICD-10-CM

## 2023-01-15 NOTE — Progress Notes (Signed)
[facility] Counselor/Therapist Progress Note  Patient ID: Brittany Davila, MRN: [id number],    Date: 01/15/2023  Time Spent: 53 minutes   Treatment Type: Individual Therapy  Reported Symptoms:  anxiety, depression improving, mood fluctuations improving, needing to be more motivated "in general", is believing more in herself, "not jumping ahead as much", ruminating decreased, some decrease in overthinking   Mental Status Exam:  Appearance:   Neat     Behavior:  Appropriate, Sharing, and motivation is challenged currently  Motor:  Normal  Speech/Language:   Clear and Coherent  Affect:  Depressed and anxiety  Mood:  anxious, depressed, and some sadness about marital conflict  Thought process:  goal directed  Thought content:    WNL  Sensory/Perceptual disturbances:    WNL  Orientation:  oriented to person, place, time/date, situation, day of week, month of year, year, and stated date of Dec. 16, 2024  Attention:  Fair  Concentration:  Fair  Memory:  WNL  Fund of knowledge:   Good  Insight:    Good and Fair  Judgment:   Good and Fair  Impulse Control:  Fair   Risk Assessment: Danger to Self:  No Self-injurious Behavior: No Danger to Others: No Duty to Warn:no Physical Aggression / Violence:No  Access to Firearms a concern: No  Gang Involvement:No   Subjective:   Patient working in appointment today and more specifically focusing on her negative thought patterns and trying to interrupt and change them.  Also trying to reduce her negative self talk and excessive worrying about the future, trying to feel less anxious. Job is source of most of her anxiety currently. Getting outside helps some with her anxiety. Communication issues have worsened some and patient describes how she, husband, and daughter all go their separate ways once they get home. Does fine when they are outside of the home, but when at home things get "more tense". Talked through  her difficulty managing money  especially when she gets depressed, tends to buy more. Husband and patient have some mutual resentments re: money and relationship issues. Trying to focus more on positives versus negatives which have been a problem recently.Talked further about her management of marital issues and came up with a couple of new suggestions for her that she feels good about trying.  Trying to hold onto the positive traits that she named about herself last session and that has helped her to believe in herself more.  Supervisor at work remains supportive.  Continues trying to believe more in herself.  Becoming more positive about herself at work.  Continues to work on reducing clutter at home since that has had a positive impact on her mood.  Encouraged to continue journaling.  Working on more changed with certain thinking patterns that are problematic including her tendency to assume worst-case scenarios.  Feeling more positive about herself and her ability to make some changes.  Interventions: Cognitive Behavioral Therapy and Ego-Supportive 1.Reduce overall level, frequency, and intensity of the anxiety so that daily functioning is not impaired. 2.Identify the major life conflicts from the past and present that form the basis for present anxiety. 3.Reinforce client's insights into the role of her past emotional pain and present anxiety.   Diagnosis:   ICD-10-CM   1. Bipolar I disorder (HCC)  F31.9      Plan:  Patient today showing some more motivation which is an important factor for her and with which she has had a lot of  difficulty in the past per her report.  She continues to work on treatment goals and that decreasing her negative self talk and negative views of herself, in addition to strategies to help reduce the frequency/level/intensity of her anxiety so that her daily functioning is not as impaired.  She is encountering obstacles from her past and is working on moving beyond that says that she is not held back  from making the progress she wants to make.  Continues to have some more hope and she feels this is related to some of her motivation and working harder to connect in therapy and "really work on my issues".  Encouraged patient in practicing more self affirming and positive behaviors including: Refrain from using negative assumptions, stay in the present focusing on what she can control or change, remain in contact with supportive people, intentionally look for more positives versus negatives daily, stay on her prescribed medication, practice "the pause" as needed, refrain from self negating, reduce overthinking and over analyzing, challenge and counteract her self-doubt, saying no without feeling guilty, letting go of guilt from the past that can still hold her back from moving forward, look more intentionally for things that give her hope, self-assess her functioning occasionally and look more for the positives versus negatives, and recognize the strengths she shows working with goal-directed behaviors to move in a direction that supports her improved self confidence, overall improved emotional health, and her outlook into the future.  Patient states she is working harder to try and have more "positive belief" in herself and that is showing more.  Demonstrates good effort and does acknowledge that changes difficult for her.  She has made progress and needs to continue her work with goal-directed behaviors to move in a healthier more hopeful direction.  Goal review and progress/challenges noted with patient.  Next appointment within 2 weeks.   Mathis Fare, LCSW

## 2023-01-16 ENCOUNTER — Other Ambulatory Visit: Payer: Self-pay | Admitting: Obstetrics and Gynecology

## 2023-01-16 DIAGNOSIS — R928 Other abnormal and inconclusive findings on diagnostic imaging of breast: Secondary | ICD-10-CM

## 2023-01-17 ENCOUNTER — Ambulatory Visit
Admission: RE | Admit: 2023-01-17 | Discharge: 2023-01-17 | Disposition: A | Discharge status: Home / Self Care | Payer: BC Managed Care – PPO | Source: Ambulatory Visit | Attending: Obstetrics and Gynecology | Admitting: Obstetrics and Gynecology

## 2023-01-17 ENCOUNTER — Inpatient Hospital Stay
Admission: RE | Admit: 2023-01-17 | Discharge: 2023-01-17 | Discharge status: Home / Self Care | Payer: BC Managed Care – PPO | Source: Ambulatory Visit | Attending: Obstetrics and Gynecology | Admitting: Obstetrics and Gynecology

## 2023-01-17 DIAGNOSIS — R928 Other abnormal and inconclusive findings on diagnostic imaging of breast: Secondary | ICD-10-CM | POA: Diagnosis present

## 2023-01-25 ENCOUNTER — Other Ambulatory Visit: Payer: Self-pay | Admitting: Adult Health

## 2023-01-25 DIAGNOSIS — F411 Generalized anxiety disorder: Secondary | ICD-10-CM

## 2023-01-26 NOTE — Telephone Encounter (Signed)
Lf 11/21 ; lv 12/13 nv 3/13

## 2023-01-29 ENCOUNTER — Ambulatory Visit: Payer: BC Managed Care – PPO | Admitting: Psychiatry

## 2023-02-06 ENCOUNTER — Ambulatory Visit: Payer: BC Managed Care – PPO | Admitting: Psychiatry

## 2023-02-19 ENCOUNTER — Ambulatory Visit: Payer: 59 | Admitting: Psychiatry

## 2023-02-19 DIAGNOSIS — F411 Generalized anxiety disorder: Secondary | ICD-10-CM | POA: Diagnosis not present

## 2023-02-19 NOTE — Progress Notes (Signed)
[facility] Counselor/Therapist Progress Note  Patient ID: Brittany Davila, MRN: [id number],    Date: 02/19/2023  Time Spent: 50 minutes   Treatment Type: Individual Therapy  Reported Symptoms: anxiety, depression improving, need to be more motivation overall, hard to believe in herself, negative thoughts but no thoughts of self-harm, ruminating increased due "to work", easy to get overwhelmed with everything at once at times, some tearfulness, overthinking, ruminating   Mental Status Exam:  Appearance:   Casual     Behavior:  Appropriate, Sharing, and some motivation  Motor:  Normal  Speech/Language:   Clear and Coherent  Affect:  Depressed and anxiety  Mood:  anxious and depressed  Thought process:  goal directed  Thought content:    Rumination  Sensory/Perceptual disturbances:    WNL  Orientation:  oriented to person, place, time/date, situation, day of week, month of year, year, and stated date of Jan. 20, 2025  Attention:  Fair  Concentration:  Fair  Memory:  WNL  Fund of knowledge:   Good  Insight:    Good and Fair  Judgment:   Good and Fair  Impulse Control:  Good and Fair   Risk Assessment: Danger to Self:  No Self-injurious Behavior: No Danger to Others: No Duty to Warn:no Physical Aggression / Violence:No  Access to Firearms a concern: No  Gang Involvement:No   Subjective:   Patient in today working further on trying to interrupt and change negative thought patterns, supported by her negative self talk and excessive worrying about the future and what may or may not have been. Especially "important for me to work on today is my anxiety, not comparing self to other, and staying on track with her behaviors that can help her stay motivated. Compares herself to others "because I don't think I'm smart enough, and all the people around me at work are very intelligent and I'm not". Working today on "staying focused with tasks at work and not letting myself jump around from  task to task".  "This is a real adjustment for me if I can do it".  Does state that getting outside some helps with her anxiety and stress and is encouraged today to take advantage of that opportunity often especially on her breaks at work as she is able.  Continues to work some own communication issues within the family at home especially when there is some tension involved.  Trying to better manage money and not let her emotions affect her money management as this has impacted her relationship with husband and cause some resentment.  States she is trying to see more positives than negatives but this is hard for her to do when she is alone, and we spoke about ways that she could work on this between sessions and we could incorporate some work on this some in our sessions here.  Reminded of the positive traits that she has named about herself last session and encouraged her to keep them in mind and in her ways of looking at herself versus so quick to be negative and punitive.  Wanting to believe more in herself and states that she is a little more positive about herself at work.  Feels that the reducing clutter at home is helping some and having a more positive mood.  Therapist continues to encourage patient to journal as she states it has helped in the past with other issues.  Does state that she feels a little more "like I can make  changes, they are just hard".  Also working to refrain from holding onto "worst-case scenario thinking.  Working with patient to connect her goals more directly with her strategies for changing behaviors.  Interventions: Cognitive Behavioral Therapy and Ego-Supportive  1.Reduce overall level, frequency, and intensity of the anxiety so that daily functioning is not impaired. 2.Identify the major life conflicts from the past and present that form the basis for present anxiety. 3.Reinforce client's insights into the role of her past emotional pain and present anxiety.    Diagnosis:   ICD-10-CM   1. Generalized anxiety disorder  F41.1      Plan: Patient today working further on her anxiety, negative self talk, excessive worrying, not comparing herself to others, and staying more positive about herself and working with behaviors that we have identified that can help her stay motivated.  Also working on some communication issues within the family to help decrease tension, better money management for patient, and to be able to see her positives rather than just what she perceives to be her negatives.  Encouraged patient in her practice of more self affirming and positive behaviors including: Refrain from using negative assumptions especially about herself, stay in the present focusing on what she can control or change, remain in contact with supportive people, look for more positives versus negatives daily, stay on her prescribed medication, practice "the pause" as needed, refrain from self negating, reduce overthinking and over analyzing, challenge and counteract her self-doubt, saying no without feeling guilty, letting go of guilt from the past that can still hold her back from moving forward now, look more intentionally for things that give her hope, self-assess her functioning occasionally and look more for the positives versus negatives, and recognize the strength she shows working with goal-directed behaviors to move in a direction that supports her improved self confidence and overall wellbeing.  Demonstrated good effort today and hoping that can continue.  Patient has made progress and needs to continue working with goal-directed behaviors to move in a more hopeful and healthier direction.  Goal review and progress/challenges noted with patient.  Next appointment within 2 weeks.   Mathis Fare, LCSW

## 2023-02-25 ENCOUNTER — Other Ambulatory Visit: Payer: Self-pay | Admitting: Adult Health

## 2023-02-25 DIAGNOSIS — G4700 Insomnia, unspecified: Secondary | ICD-10-CM

## 2023-02-28 NOTE — Telephone Encounter (Signed)
PA was submitted through [facility] for Zolpidem CR 12.5 mg #30 but response was no PA needed it was resolved.  Contacted [facility] by phone to make sure there was not anything else that needed to be done and the representative ran a claim for #30 and he had no issues. He reports pharmacy should be able to run for #30.   Rx should go as #30.

## 2023-02-28 NOTE — Telephone Encounter (Signed)
Brittany Davila called and LM at 11:13 request status of the refill of her Zolpidem.

## 2023-02-28 NOTE — Telephone Encounter (Signed)
Lf 12/28; lv 12/13 nv 3/13

## 2023-02-28 NOTE — Telephone Encounter (Signed)
I do not see where a PA was ever submitted for the quantity so we will try that first for #30. Hold off until PA completed.

## 2023-03-05 ENCOUNTER — Ambulatory Visit: Payer: 59 | Admitting: Psychiatry

## 2023-03-05 DIAGNOSIS — F411 Generalized anxiety disorder: Secondary | ICD-10-CM

## 2023-03-05 NOTE — Progress Notes (Signed)
[facility] Counselor/Therapist Progress Note  Patient ID: Brittany Davila, MRN: [id number],    Date: 03/05/2023  Time Spent: 55 minutes   Treatment Type: Individual Therapy  Reported Symptoms: anxiety, depression, needing increased motivation, negative thought esp towards self, did have some SI last week but none in past 7 days, hard to believe in herself, ruminating due to work, easy to get overwhelmed with everything at once, tearfulness, overthinking    Mental Status Exam:  Appearance:   Casual and Neat     Behavior:  Appropriate, Sharing, and lower motivation (a 3 on 1-10 scale)  Motor:  Normal  Speech/Language:   Clear and Coherent  Affect:  Depressed and anxious  Mood:  anxious and depressed  Thought process:  goal directed  Thought content:    Rumination  Sensory/Perceptual disturbances:    WNL  Orientation:  oriented to person, place, time/date, situation, day of week, month of year, year, and stated date of Feb. 3, 2025  Attention:  Good  Concentration:  Fair  Memory:  WNL  Fund of knowledge:   Good and Fair  Insight:    Good and Fair  Judgment:   Good  Impulse Control:  Good and Fair   Risk Assessment: Danger to Self:  No Self-injurious Behavior: No Danger to Others: No Duty to Warn:no Physical Aggression / Violence:No  Access to Firearms a concern: No  Gang Involvement:No   Subjective:  Patient in for session today working further on her negative thought patterns trying to interrupt them and replace with more positive thought patterns, trying to decrease her negative self talk and excessive worrying especially into the future or having regrets and overly focusing on "what may or may not have been". Past couple of weeks has been "rougher" re: money and having difficulty not over-spending.  Gets into a vicious cycle of overspending and then "hating myself", overspending again to calm the pain of hating herself, etc. etc. worked well in session today on this cycle of  overspending and discussed strategies that can help her begin to make a difference.  Acknowledges that it is hard for her to set limits with herself and keep them and we discussed this in more detail, as well as looking at ways that might help her be more successful in maintaining limits.  Encouraged her continuing to journal some his homework and include these type situations in her journaling.  Continues to work on her anxiety, self motivation, and refraining from comparing herself to others.  Worked further on negative self talk including "I do not think I am smart enough".  Did better and working on her self negating today however it is still an issue and as she reports, it has been for quite a while.  Does feel that she is staying focused at work when she is there, but has been out sick more recently due to some type of virus.  Encouraged her to stop the comparison of herself to others.  She states that she wants to be able to do this "but it is a real adjustment for me".  Continues working on some issues within the family especially some tension and communication concerns.  Trying to better manage money and not that her emotions take over as this is already created some friction in her marriage and definitely impacted relationship with husband.  They now keep their finances separate.  Describes some ways that marital relationship has been impacted by patient's overspending and continuing to  do so.  Did remind patient of some positive traits that she has shared about herself and encouraging her to not lose sight of those as she looks at herself, and trying to decrease her feelings of being negative and punitive towards herself.  Wants to believe more in herself and shared that 1 area where she is believing more in herself is "sometimes at work."  Encouraged to let go of her "worst-case scenario thinking".  Urged more focus on her goals and to feel positive even about gradual changes.  Interventions: Cognitive  Behavioral Therapy and Ego-Supportive  1.Reduce overall level, frequency, and intensity of the anxiety so that daily functioning is not impaired. 2.Identify the major life conflicts from the past and present that form the basis for present anxiety. 3.Reinforce client's insights into the role of her past emotional pain and present anxiety.   Diagnosis:   ICD-10-CM   1. Generalized anxiety disorder  F41.1      Plan: Patient today showing good efforts as she worked further on her overspending, poor budgeting, anxiety, negative self talk, comparing herself to others, excessive worrying, and trying to be more positive about herself with behaviors that we had previously identified to help her be more motivated and less self-defeating.  Continues her work and communication issues within the family aimed at decreasing tension that also affects patient's poor money management.  Urged to see more positives versus her negatives. Encouraged patient to be practicing more self affirming and positive behaviors including: Refrain from using negative assumptions about herself and others, stay in the present focusing on what she can control or change, stay in contact with supportive people, look for more positives versus negatives daily, remain on her prescribed medication, practice "the pause" as needed, refrain from self negating, reduce overthinking and over analyzing, challenge and counteract her self-doubt, saying no without feeling guilty, letting go of guilt from the past that can still hold her back from moving forward now, look more intentionally for things that give her hope, self-assess her functioning occasionally and look more for the positives versus negatives, and realize the strengths she shows working with goal-directed behaviors to move in a direction that supports her improved self-confidence and her overall look into the future.  Patient has made progress and needs to continue working with goal-directed  behaviors to move in a more hopeful and healthier direction.  Goal review and progress/challenges noted with patient.  Next appointment within 2 weeks.   Mathis Fare, LCSW

## 2023-03-17 ENCOUNTER — Other Ambulatory Visit: Payer: Self-pay | Admitting: Adult Health

## 2023-03-17 DIAGNOSIS — F319 Bipolar disorder, unspecified: Secondary | ICD-10-CM

## 2023-03-20 ENCOUNTER — Encounter: Payer: Self-pay | Admitting: Neurology

## 2023-03-20 MED ORDER — NURTEC 75 MG PO TBDP: 75.0000 mg | ORAL_TABLET | ORAL | 11 refills | Status: AC | PRN

## 2023-03-21 ENCOUNTER — Ambulatory Visit: Payer: 59 | Admitting: Psychiatry

## 2023-04-02 ENCOUNTER — Ambulatory Visit (INDEPENDENT_AMBULATORY_CARE_PROVIDER_SITE_OTHER): Payer: Self-pay | Admitting: Psychiatry

## 2023-04-02 DIAGNOSIS — F411 Generalized anxiety disorder: Secondary | ICD-10-CM

## 2023-04-02 NOTE — Progress Notes (Signed)
           Patient no-showed for appt.  NS50 charge.

## 2023-04-07 ENCOUNTER — Other Ambulatory Visit: Payer: Self-pay | Admitting: Adult Health

## 2023-04-07 DIAGNOSIS — F319 Bipolar disorder, unspecified: Secondary | ICD-10-CM

## 2023-04-07 DIAGNOSIS — G4700 Insomnia, unspecified: Secondary | ICD-10-CM

## 2023-04-10 NOTE — Progress Notes (Unsigned)
 PATIENT: Brittany Davila DOB: 10/26/1975  REASON FOR VISIT: follow up HISTORY FROM: patient Primary Neurologist: Dr. Terrace Arabia   ASSESSMENT AND PLAN 48 y.o. year old female  has a past medical history of Anxiety, Basal cell carcinoma (BCC) of face, Bell's palsy, Bipolar 1 disorder (HCC), Depression, Headache, and History of genital warts. here with:  1.  Chronic migraine headache -Restart Ajovy 225 mg monthly injection for migraine prevention  -Continue Maxalt melt 10 mg as needed for acute headache, I gave her sample of Nurtec to try, if she likes Nurtec will send in a prescription -Stop daily Excedrin Migraine, BC usage, I have concern for rebound headache -MRI of the brain was normal September 2020 -Previously tried and failed: Imitrex, propranolol, is not a candidate for Topamax due to history of kidney stones, antidepressants worsen her depression and make her feel suicidal; currently taking Lamictal, gabapentin -Follow-up in 6 months or sooner if needed, encouraged to reach out via MyChart if needed  2.  Paresthesia left arm and leg -Nearly resolved, mostly just in the left leg -EMG evaluation in December 2020 was normal of the left arm and left leg -MRI lumbar spine in November 2019 was normal -MRI of thoracic spine was normal, incidental left renal cystic structure measuring 4.9 cm.  MRI of cervical spine was normal  3.  Bipolar disorder, anxiety -Remains under the care of psychiatry, on Lamictal and gabapentin, xanax   HISTORY  Brittany Davila is a 48 year old female, seen in request by her primary care physician Dr. Billy Fischer for evaluation of headaches initial evaluation was on September 05, 2018.   I have reviewed and summarized the referring note from the referring physician.  She had a past medical history of bipolar disorder, has been on stable dose of Lamictal 150 mg twice a day, she began to have migraine headaches since 2018, increased frequency over the past 3 months, she  complains of dizziness, especially with sudden positional change, such as bending over, losing her balance easily, she also complains of intermittent right face icy cold sensation, numbness, sometimes she feels her body spinning back-and-forth with movement, she also has bilateral frontal retro-orbital area pressure headaches, she has migraine headaches about couple times each week, she describes migraine as right retro-orbital area severe pounding headache with sensitive to light and noise, 2 to 3 days, she has tried Excedrin Migraine and muscle relaxant with some helps, in addition she complains of intermittent left arm and leg pain, low back pain,   Update November 06, 2018 SS: MRI of the brain 10/02/2018 was normal.    Since starting propanolol, is now having less intense, dull headaches, 2-3 times a week.  It does not prevent her from doing anything.  The headache is located right occipitally, spreads forward, Icy/cold sensation to her right face with headache.  She has history of right-sided Bell's palsy when she was in college, feels that same sensation.  Imitrex has been beneficial, but rarely has to take it.  She will now take Excedrin Migraine with modest benefit.  She reports stress, works full-time, has a 47 year old, taking 2 college classes.  With her headaches, she reports photophobia, phonophobia, at times nausea.  She denies weakness or drooping of the face.  She reports 2 years ago, she developed left-sided low back pain, numbness to her left leg, pain down her leg, hot/cold sensation to her left calf, behind her left knee.  At times, her left arm will go numb.  She says she feels the best when she is standing.  She has had MRI of her lumbar spine that was normal.  She completed a few sessions of physical therapy that were beneficial.  She presents today for follow-up unaccompanied.   Update March 11, 2019 SS: At last visit propanolol was increased 60 mg twice a day, EMG nerve evaluation in  December 2020 was normal of the left upper and lower extremity.  She continues to complain of numbness to her left leg, pain, hot/cold sensation, from her mid calf up to her mid thigh.  She does not experience the pain if she is up moving around.  It occurs during times of sitting.  The numbness to her left arm, is now very rare.  She denies bowel or urinary incontinence.  She has not had any falls.  She denies any back pain.  The physical therapy she did prior, was helpful for the back pain, not the leg symptoms.  Her headaches have improved with propanolol.  She says she may have 1 dull headache a week, that is relieved with Tylenol or Excedrin Migraine to her right occipital area.  She says also at night when lying flat, on her wedge pillow, she may have numbness to the right side of her face.  She is now taking gabapentin from her psychiatrist for irritability.  She has Abilify to take if the gabapentin does not work.  The gabapentin has also helped with her leg pain.  She presents today for evaluation unaccompanied.  Update July 12, 2020 SS: Here today for follow-up unaccompanied, left leg paresthesia is 80% resolved.  Now taking zinc, vitamin D3, B6, B complex.  Did a food sensitivity test, made eliminations.  Has been out of propanolol for several months.  On average 1-2 migraines a week.  Does not like the way Imitrex makes her feel (dizzy, nauseated, tingling "weird", can only take when at home, starts with Excedrin Migraine.  Missing 1 to 2 days a month. Works at [employer] with [employer]. Has FMLA for bipolar disorder.   Saw urology, known left kidney stone, left renal cyst was not mentioned.  Mood is stable, continues to see psychiatry. Feels more agitated with headache.  MRI of thoracic spine was normal, incidental left renal cystic structure measuring 4.9 cm.  MRI of cervical spine was normal.  Update January 18, 2021 SS: Taking propranolol 60 twice daily, daily headache, 1 migraine a  week, missing 1 day of work a week. Husbands snoring makes it worse usually has in the morning, the Maxalt works but makes sleepy. Has FMLA for bipolar disorder. On hormones progesterone and estradiol for perimenopause. Was tried on antidepressant, make her depressed, almost suicidal. Left sided sensory symptoms essentially resolved.   Update October 18, 2022 SS: has not been seen since Dec 2022. Last seen was given Ajovy, took for 6 months with good benefit, stopped it a lot going on. Is off propranolol. Maxalt was working well, would see spots, zone out for a little at work, but worked well. Works at [employer] in the office, has changed jobs, more stress, Educational psychologist. 2 migraines weekly, right occipital area, radiates forward, behind right eye. Migraine features, migraine today, wearing sunglasses. Taking excedrin migraine, BC powder, is taking daily. Has daily headache that is mild to moderate, continues on the right side. Has FMLA from psychiatry, misses about 1 day a week of work.   Update April 11, 2023 SS:   REVIEW OF SYSTEMS: Out of  a complete 14 system review of symptoms, the patient complains only of the following symptoms, and all other reviewed systems are negative.  Headache  ALLERGIES: Allergies  Allergen Reactions   Pollen Extract Other (See Comments)   Progesterone Other (See Comments)    Worsening depression   Soy Allergy (Obsolete) Nausea Only and Other (See Comments)    intolerance  Other Reaction(s): Other (See Comments)    HOME MEDICATIONS: Outpatient Medications Prior to Visit  Medication Sig Dispense Refill   ALPRAZolam (XANAX) 1 MG tablet TAKE ONE HALF TABLET BY MOUTH 2 TIMES DAILY 30 tablet 2   cariprazine (VRAYLAR) 3 MG capsule TAKE 1 CAPSULE BY MOUTH DAILY. 30 capsule 0   Fremanezumab-vfrm (AJOVY) 225 MG/1.5ML SOAJ Inject 225 mg into the skin every 30 (thirty) days. 1.68 mL 11   Fremanezumab-vfrm (AJOVY) 225 MG/1.5ML SOAJ Inject 1 Pen into the skin every 30  (thirty) days.     gabapentin (NEURONTIN) 100 MG capsule Take two capsules three times daily. 180 capsule 5   lamoTRIgine (LAMICTAL) 200 MG tablet TAKE 1 TABLET BY MOUTH EVERY DAY 90 tablet 1   lamoTRIgine (LAMICTAL) 25 MG tablet TAKE TWO TABLETS BY MOUTH IN THE MORNING 120 tablet 0   ondansetron (ZOFRAN) 4 MG tablet Take 1 tablet (4 mg total) by mouth every 8 (eight) hours as needed for nausea or vomiting. 20 tablet 1   Rimegepant Sulfate (NURTEC) 75 MG TBDP Take one at migraine onset.     Rimegepant Sulfate (NURTEC) 75 MG TBDP Take 1 tablet (75 mg total) by mouth as needed. 8 tablet 11   rizatriptan (MAXALT-MLT) 10 MG disintegrating tablet Take 1 tablet (10 mg total) by mouth as needed for migraine. May repeat in 2 hours if needed 9 tablet 11   zolpidem (AMBIEN CR) 12.5 MG CR tablet TAKE 1 TABLET BY MOUTH EVERY DAY AT BEDTIME AS NEEDED FOR SLEEP 30 tablet 0   No facility-administered medications prior to visit.    PAST MEDICAL HISTORY: Past Medical History:  Diagnosis Date   Anxiety    Basal cell carcinoma (BCC) of face    Bell's palsy    H/O   Bipolar 1 disorder (HCC)    Depression    Headache    MIGRAINES   History of genital warts     PAST SURGICAL HISTORY: Past Surgical History:  Procedure Laterality Date   BREAST BIOPSY Right 07/03/2018   [place] bx           heart marker              path pending   BREAST CYST ASPIRATION Left 2014   by Dr. Katrinka Blazing   LAPAROSCOPIC TUBAL LIGATION Bilateral 11/10/2016   Procedure: LAPAROSCOPIC TUBAL LIGATION;  Surgeon: Christeen Douglas, MD;  Location: [facility] ORS;  Service: Gynecology;  Laterality: Bilateral;   MOHS SURGERY     TONSILLECTOMY AND ADENOIDECTOMY     tubes in right ear      FAMILY HISTORY: Family History  Problem Relation Age of Onset   Diabetes Maternal Grandfather    Breast cancer Paternal Grandmother 56   Colon cancer Paternal Grandfather 74   Pancreatic cancer Paternal Grandfather    Depression Mother    Depression Father     Lung cancer Maternal Aunt     SOCIAL HISTORY: Social History   Socioeconomic History   Marital status: Married    Spouse name: Maisie Fus   Number of children: 1   Years of education: Chiropodist school  Highest education level: Bachelor's degree (e.g., BA, AB, BS)  Occupational History    Employer: [employer]   Occupation: [employer]  Tobacco Use   Smoking status: Never   Smokeless tobacco: Never  Vaping Use   Vaping status: Never Used  Substance and Sexual Activity   Alcohol use: Not Currently    Alcohol/week: 1.0 standard drink of alcohol    Types: 1 Standard drinks or equivalent per week    Comment: 1-2 times a month   Drug use: No   Sexual activity: Yes    Partners: Male    Birth control/protection: Surgical  Other Topics Concern   Not on file  Social History Narrative   10/13/19   From: [place], [place]   Living: with husband, Maisie Fus [place]) - 2005   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      Family: Control and instrumentation engineer (2008)      Enjoys: watch TV - [place]      Exercise: not currently   Diet: not great - meat - starch veggie, fast food - twice daily      Safety   Seat belts: Yes    Guns: Yes  and secure   Safe in relationships: Yes    Social Drivers of Health   Financial Resource Strain: Low Risk  (12/18/2022)   Received from [facility]   Overall Financial Resource Strain (CARDIA)    Difficulty of Paying Living Expenses: Not hard at all  Food Insecurity: No Food Insecurity (12/18/2022)   Received from [facility]   Hunger Vital Sign    Worried About Running Out of Food in the Last Year: Never true    Ran Out of Food in the Last Year: Never true  Transportation Needs: No Transportation Needs (12/18/2022)   Received from [facility] - Transportation    In the past 12 months, has lack of transportation kept you from medical appointments or from getting medications?: No    Lack of Transportation  (Non-Medical): No  Physical Activity: Not on file  Stress: Not on file  Social Connections: Not on file  Intimate Partner Violence: Not on file   PHYSICAL EXAM  There were no vitals filed for this visit.  There is no height or weight on file to calculate BMI.  Generalized: Well developed, in no acute distress, wearing sunglasses Neurological examination  Mentation: Alert oriented to time, place, history taking. Follows all commands speech and language fluent Cranial nerve II-XII: Pupils were equal round reactive to light. Extraocular movements were full, visual field were full on confrontational test. Facial sensation and strength were normal. Head turning and shoulder shrug were normal and symmetric. Motor: The motor testing reveals 5 over 5 strength of all 4 extremities. Good symmetric motor tone is noted throughout.  Sensory: Reports decreased sensation to soft touch to left arm Coordination: Cerebellar testing reveals good finger-nose-finger and heel-to-shin bilaterally.  Gait and station: Gait is normal.  Reflexes: Deep tendon reflexes are symmetric and normal bilaterally.   DIAGNOSTIC DATA (LABS, IMAGING, TESTING) - I reviewed patient records, labs, notes, testing and imaging myself where available.  Lab Results  Component Value Date   WBC 5.6 02/20/2022   HGB 14.3 02/20/2022   HCT 43.5 02/20/2022   MCV 90.9 02/20/2022   PLT 311.0 02/20/2022      Component Value Date/Time   NA 140 07/08/2021 0904   K 3.8 07/08/2021 0904   CL 106 07/08/2021 0904  CO2 28 07/08/2021 0904   GLUCOSE 76 07/08/2021 0904   BUN 10 07/08/2021 0904   CREATININE 0.90 07/08/2021 0904   CALCIUM 9.4 07/08/2021 0904   PROT 6.9 07/08/2021 0904   ALBUMIN 4.2 07/08/2021 0904   AST 12 07/08/2021 0904   ALT 10 07/08/2021 0904   ALKPHOS 60 07/08/2021 0904   BILITOT 0.4 07/08/2021 0904   GFRNONAA >60 02/18/2017 1320   GFRAA >60 02/18/2017 1320   Lab Results  Component Value Date   CHOL 172  07/08/2021   HDL 51.20 07/08/2021   LDLCALC 102 (H) 07/08/2021   TRIG 92.0 07/08/2021   CHOLHDL 3 07/08/2021   Lab Results  Component Value Date   HGBA1C 5.4 07/08/2021   Lab Results  Component Value Date   VITAMINB12 225 02/20/2022   Lab Results  Component Value Date   TSH 2.80 02/20/2022   Margie Ege, AGNP-C, DNP 04/10/2023, 2:56 PM [facility] [address] [name], [place] [address] [phone]

## 2023-04-11 ENCOUNTER — Ambulatory Visit: Payer: 59 | Admitting: Neurology

## 2023-04-11 ENCOUNTER — Encounter: Payer: Self-pay | Admitting: Neurology

## 2023-04-11 VITALS — BP 120/82 | HR 81 | Ht 68.0 in | Wt 221.0 lb

## 2023-04-11 DIAGNOSIS — G43009 Migraine without aura, not intractable, without status migrainosus: Secondary | ICD-10-CM

## 2023-04-11 DIAGNOSIS — F419 Anxiety disorder, unspecified: Secondary | ICD-10-CM

## 2023-04-11 DIAGNOSIS — F32A Depression, unspecified: Secondary | ICD-10-CM

## 2023-04-11 DIAGNOSIS — F319 Bipolar disorder, unspecified: Secondary | ICD-10-CM | POA: Diagnosis not present

## 2023-04-11 MED ORDER — QULIPTA 60 MG PO TABS
60.0000 mg | ORAL_TABLET | Freq: Every day | ORAL | 11 refills | Status: AC
Start: 2023-04-11 — End: ?

## 2023-04-11 MED ORDER — ONDANSETRON HCL 4 MG PO TABS
4.0000 mg | ORAL_TABLET | Freq: Three times a day (TID) | ORAL | 1 refills | Status: DC | PRN
Start: 2023-04-11 — End: 2023-06-21

## 2023-04-11 MED ORDER — UBRELVY 100 MG PO TABS: 100.0000 mg | ORAL_TABLET | ORAL | Status: DC | PRN

## 2023-04-11 NOTE — Patient Instructions (Signed)
 Stop Ajovy, start Qulipta 60 mg daily   Try Ubrelvy 100 mg at onset of migraine, Take 1 tablet at onset of headache, may repeat in 2 hours if needed. Max is 200 mg in 24 hours.   If Bernita Raisin is not helpful, continue Nurtec, may combine with Aleve, zofran for prolonged headache

## 2023-04-12 ENCOUNTER — Telehealth (INDEPENDENT_AMBULATORY_CARE_PROVIDER_SITE_OTHER): Payer: BC Managed Care – PPO | Admitting: Adult Health

## 2023-04-12 ENCOUNTER — Encounter: Payer: Self-pay | Admitting: Adult Health

## 2023-04-12 DIAGNOSIS — F411 Generalized anxiety disorder: Secondary | ICD-10-CM | POA: Diagnosis not present

## 2023-04-12 DIAGNOSIS — F319 Bipolar disorder, unspecified: Secondary | ICD-10-CM

## 2023-04-12 DIAGNOSIS — G4700 Insomnia, unspecified: Secondary | ICD-10-CM

## 2023-04-12 MED ORDER — CARIPRAZINE HCL 3 MG PO CAPS: 3.0000 mg | ORAL_CAPSULE | Freq: Every day | ORAL | 5 refills | Status: DC

## 2023-04-12 MED ORDER — LAMOTRIGINE 200 MG PO TABS: ORAL_TABLET | ORAL | 1 refills | Status: DC

## 2023-04-12 MED ORDER — ALPRAZOLAM 1 MG PO TABS: ORAL_TABLET | ORAL | 2 refills | Status: DC

## 2023-04-12 MED ORDER — LAMOTRIGINE 25 MG PO TABS: 50.0000 mg | ORAL_TABLET | Freq: Every morning | ORAL | 1 refills | Status: DC

## 2023-04-12 MED ORDER — ZOLPIDEM TARTRATE ER 12.5 MG PO TBCR: 12.5000 mg | EXTENDED_RELEASE_TABLET | Freq: Every day | ORAL | 2 refills | Status: DC

## 2023-04-12 NOTE — Progress Notes (Signed)
 Brittany Davila [id number] 21-May-1975 48 y.o.  Virtual Visit via Video Note  I connected with pt @ on 04/12/23 at  8:30 AM EDT by a video enabled telemedicine application and verified that I am speaking with the correct person using two identifiers.   I discussed the limitations of evaluation and management by telemedicine and the availability of in person appointments. The patient expressed understanding and agreed to proceed.  I discussed the assessment and treatment plan with the patient. The patient was provided an opportunity to ask questions and all were answered. The patient agreed with the plan and demonstrated an understanding of the instructions.   The patient was advised to call back or seek an in-person evaluation if the symptoms worsen or if the condition fails to improve as anticipated.  I provided 25 minutes of non-face-to-face time during this encounter.  The patient was located at home.  The provider was located at [facility].   Dorothyann Gibbs, NP   Subjective:   Patient ID:  Brittany Davila is a 48 y.o. (DOB 18-Jun-1975) female.  Chief Complaint: No chief complaint on file.   HPI Rika RAELEE ROSSMANN presents for follow-up of GAD, BPD, and insomnia   Describes mood today as "not too good". Pleasant. Reports tearfulness at times. Mood symptoms - reports increased depression and anxiety since last visit. Denies irritability. Reports panic attacks - twice a week. Reports some worry, rumination, and over thinking. Denies mood swings. Reports mood as low. Stating "I've been pretty down". Does not feel like current medication regimen is helpful. Would like to consider other options. Takes medications as prescribed.  Energy levels lower. Active, does not have a regular exercise routine.  Enjoys some usual interests and activities. Married. Lives with husband and 47 year-old daughter. Spending time with family. Appetite adequate. Weight gain - 221 pounds. Sleep is variable.  Averages 4 hours a night. Reports difficulties with focus and concentration. Completing tasks. Managing aspects of household. 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 Denies SI or HI.  Denies AH or VH. Denies self harm. Denies substance use.  Review of Systems:  Review of Systems  Musculoskeletal:  Negative for gait problem.  Neurological:  Negative for tremors.  Psychiatric/Behavioral:         Please refer to HPI   Medications: I have reviewed the patient's current medications.  Current Outpatient Medications  Medication Sig Dispense Refill   ALPRAZolam (XANAX) 1 MG tablet TAKE ONE HALF TABLET BY MOUTH 2 TIMES DAILY 30 tablet 2   Atogepant (QULIPTA) 60 MG TABS Take 1 tablet (60 mg total) by mouth daily. 30 tablet 11   cariprazine (VRAYLAR) 3 MG capsule Take 1 capsule (3 mg total) by mouth daily. 30 capsule 5   gabapentin (NEURONTIN) 100 MG capsule Take two capsules three times daily. 180 capsule 5   lamoTRIgine (LAMICTAL) 200 MG tablet TAKE 1 TABLET BY MOUTH EVERY DAY 90 tablet 1   lamoTRIgine (LAMICTAL) 25 MG tablet Take 2 tablets (50 mg total) by mouth every morning. 180 tablet 1   ondansetron (ZOFRAN) 4 MG tablet Take 1 tablet (4 mg total) by mouth every 8 (eight) hours as needed for nausea or vomiting. 20 tablet 1   Rimegepant Sulfate (NURTEC) 75 MG TBDP Take 1 tablet (75 mg total) by mouth as needed. 8 tablet 11   rizatriptan (MAXALT-MLT) 10 MG disintegrating tablet Take 1 tablet (10 mg total) by mouth as needed for migraine. May repeat in 2 hours if  needed 9 tablet 11   Ubrogepant (UBRELVY) 100 MG TABS Take 1 tablet (100 mg total) by mouth as needed.     zolpidem (AMBIEN CR) 12.5 MG CR tablet Take 1 tablet (12.5 mg total) by mouth at bedtime. 30 tablet 2   No current facility-administered medications for this visit.    Medication Side Effects: None  Allergies:  Allergies  Allergen Reactions   Pollen Extract Other (See Comments)   Progesterone Other (See Comments)     Worsening depression   Soy Allergy (Obsolete) Nausea Only and Other (See Comments)    intolerance  Other Reaction(s): Other (See Comments)    Past Medical History:  Diagnosis Date   Anxiety    Basal cell carcinoma (BCC) of face    Bell's palsy    H/O   Bipolar 1 disorder (HCC)    Depression    Headache    MIGRAINES   History of genital warts     Family History  Problem Relation Age of Onset   Diabetes Maternal Grandfather    Breast cancer Paternal Grandmother 75   Colon cancer Paternal Grandfather 30   Pancreatic cancer Paternal Grandfather    Depression Mother    Depression Father    Lung cancer Maternal Aunt     Social History   Socioeconomic History   Marital status: Married    Spouse name: Maisie Fus   Number of children: 1   Years of education: Grad school   Highest education level: Bachelor's degree (e.g., BA, AB, BS)  Occupational History    Employer: [employer]   Occupation: [employer]  Tobacco Use   Smoking status: Never   Smokeless tobacco: Never  Vaping Use   Vaping status: Never Used  Substance and Sexual Activity   Alcohol use: Not Currently    Alcohol/week: 1.0 standard drink of alcohol    Types: 1 Standard drinks or equivalent per week    Comment: 1-2 times a month   Drug use: No   Sexual activity: Yes    Partners: Male    Birth control/protection: Surgical  Other Topics Concern   Not on file  Social History Narrative   10/13/19   From: [place], [place]   Living: with husband, Maisie Fus [place]) - 2005   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      Family: Control and instrumentation engineer (2008)      Enjoys: watch TV - [place]      Exercise: not currently   Diet: not great - meat - starch veggie, fast food - twice daily      Safety   Seat belts: Yes    Guns: Yes  and secure   Safe in relationships: Yes    Social Drivers of Health   Financial Resource Strain: Low Risk  (12/18/2022)   [facility]   Overall Financial Resource  Strain (CARDIA)    Difficulty of Paying Living Expenses: Not hard at all  Food Insecurity: No Food Insecurity (12/18/2022)   Received from [facility]   Hunger Vital Sign    Worried About Running Out of Food in the Last Year: Never true    Ran Out of Food in the Last Year: Never true  Transportation Needs: No Transportation Needs (12/18/2022)   Received from [facility] - Transportation    In the past 12 months, has lack of transportation kept you from medical appointments or from getting medications?: No    Lack of  Transportation (Non-Medical): No  Physical Activity: Not on file  Stress: Not on file  Social Connections: Not on file  Intimate Partner Violence: Not on file    Past Medical History, Surgical history, Social history, and Family history were reviewed and updated as appropriate.   Please see review of systems for further details on the patient's review from today.   Objective:   Physical Exam:  LMP 08/08/2021 Comment: Menopause stages  Physical Exam Constitutional:      General: She is not in acute distress. Musculoskeletal:        General: No deformity.  Neurological:     Mental Status: She is alert and oriented to person, place, and time.     Coordination: Coordination normal.  Psychiatric:        Attention and Perception: Attention and perception normal. She does not perceive auditory or visual hallucinations.        Mood and Affect: Affect is not labile, blunt, angry or inappropriate.        Speech: Speech normal.        Behavior: Behavior normal.        Thought Content: Thought content normal. Thought content is not paranoid or delusional. Thought content does not include homicidal or suicidal ideation. Thought content does not include homicidal or suicidal plan.        Cognition and Memory: Cognition and memory normal.        Judgment: Judgment normal.     Comments: Insight intact     Lab Review:      Component Value Date/Time   NA 140 07/08/2021 0904   K 3.8 07/08/2021 0904   CL 106 07/08/2021 0904   CO2 28 07/08/2021 0904   GLUCOSE 76 07/08/2021 0904   BUN 10 07/08/2021 0904   CREATININE 0.90 07/08/2021 0904   CALCIUM 9.4 07/08/2021 0904   PROT 6.9 07/08/2021 0904   ALBUMIN 4.2 07/08/2021 0904   AST 12 07/08/2021 0904   ALT 10 07/08/2021 0904   ALKPHOS 60 07/08/2021 0904   BILITOT 0.4 07/08/2021 0904   GFRNONAA >60 02/18/2017 1320   GFRAA >60 02/18/2017 1320       Component Value Date/Time   WBC 5.6 02/20/2022 0847   RBC 4.79 02/20/2022 0847   HGB 14.3 02/20/2022 0847   HCT 43.5 02/20/2022 0847   PLT 311.0 02/20/2022 0847   MCV 90.9 02/20/2022 0847   MCH 30.6 02/18/2017 1320   MCHC 32.8 02/20/2022 0847   RDW 13.1 02/20/2022 0847   LYMPHSABS 2.2 02/20/2022 0847   MONOABS 0.5 02/20/2022 0847   EOSABS 0.1 02/20/2022 0847   BASOSABS 0.0 02/20/2022 0847    No results found for: "POCLITH", "LITHIUM"   No results found for: "PHENYTOIN", "PHENOBARB", "VALPROATE", "CBMZ"   .res Assessment: Plan:    Plan:  D/C Gabapentin 200mg  TID - discussed taper off medication.  Ambien CR 12.5mg  at hs Xanax 1mg  - increase BID Lamictal 200mg  at hs Lamictal 25 - 2 every morning. Vraylar 3mg  daily  Restarted magnesium  Continue FMLA - will bring in paperwork to update.  RTC 4 weeks  25 minutes spent dedicated to the care of this patient on the date of this encounter to include pre-visit review of records, ordering of medication, post visit documentation, and face-to-face time with the patient discussing BPD, GAD and insomnia. Discussed discontinuing Gabapentin and increasing the Xanax temporarily. Also discussed tapering off the Gabapentin - ineffective. Continue other medications.  Patient advised to contact office with any  questions, adverse effects, or acute worsening in signs and symptoms.  Discussed potential benefits, risk, and side effects of benzodiazepines to  include potential risk of tolerance and dependence, as well as possible drowsiness.  Advised patient not to drive if experiencing drowsiness and to take lowest possible effective dose to minimize risk of dependence and tolerance.  Discussed potential metabolic side effects associated with atypical antipsychotics, as well as potential risk for movement side effects. Advised pt to contact office if movement side effects occur.  Diagnoses and all orders for this visit:  Generalized anxiety disorder -     ALPRAZolam (XANAX) 1 MG tablet; TAKE ONE HALF TABLET BY MOUTH 2 TIMES DAILY  Bipolar I disorder (HCC) -     cariprazine (VRAYLAR) 3 MG capsule; Take 1 capsule (3 mg total) by mouth daily. -     lamoTRIgine (LAMICTAL) 200 MG tablet; TAKE 1 TABLET BY MOUTH EVERY DAY -     lamoTRIgine (LAMICTAL) 25 MG tablet; Take 2 tablets (50 mg total) by mouth every morning.  Insomnia, unspecified type -     zolpidem (AMBIEN CR) 12.5 MG CR tablet; Take 1 tablet (12.5 mg total) by mouth at bedtime.     Please see After Visit Summary for patient specific instructions.  Future Appointments  Date Time Provider Department Center  04/16/2023  8:00 AM Mathis Fare, LCSW [facility] None  04/30/2023  8:00 AM Mathis Fare, LCSW [facility] None  10/31/2023  7:45 AM Glean Salvo, NP [facility] None    No orders of the defined types were placed in this encounter.     -------------------------------

## 2023-04-16 ENCOUNTER — Telehealth: Payer: Self-pay | Admitting: Adult Health

## 2023-04-16 ENCOUNTER — Ambulatory Visit: Payer: 59 | Admitting: Psychiatry

## 2023-04-16 DIAGNOSIS — F411 Generalized anxiety disorder: Secondary | ICD-10-CM | POA: Diagnosis not present

## 2023-04-16 NOTE — Progress Notes (Signed)
 [facility] Counselor/Therapist Progress Note  Patient ID: Brittany Davila, MRN: [id number],    Date: 04/16/2023  Time Spent: 50 minutes   Treatment Type: Individual Therapy  Reported Symptoms: anxiety, depression, low motivation, negative thoughts towards self, no SI, hard to believe in herself, ruminating some about my work, decreased overwhelmedness, less tearfulness, still overthinking   Mental Status Exam:  Appearance:   Casual and Neat     Behavior:  Appropriate and Sharing  Motor:  Normal  Speech/Language:   Clear and Coherent  Affect:  anxious  Mood:  anxious  Thought process:  goal directed  Thought content:    WNL  Sensory/Perceptual disturbances:    WNL  Orientation:  oriented to person, place, time/date, situation, day of week, month of year, year, and stated date of April 16, 2023  Attention:  Fair  Concentration:  Fair  Memory:  WNL  Fund of knowledge:   Good  Insight:    Good and Fair  Judgment:   Good and Fair  Impulse Control:  Good and Fair   Risk Assessment: Danger to Self:  No Self-injurious Behavior: No Danger to Others: No Duty to Warn:no Physical Aggression / Violence:No  Access to Firearms a concern: No  Gang Involvement:No   Subjective:   Patient today in session to work further on her self negating patterns as they feed her anxiety and depression, and also trying to interrupt them and replace with more positive thought patterns, working to decrease her negative self talk and excessive worrying especially as she negatively assumes what the future will be like and experiences feelings of regret and overly focusing on "what may or may not have been". Feeling not so motivated at work and feeling "down in my bipolar" and focused more on this today.  Had some SI since her last appointment but states she talked with family and felt better and was not close to harming herself. Denies any current SI but has been struggling more with depression, not following  through on her homework. Discussed this with patient and she commits to start her journaling again as it did help her. In addition to journaling, patient also to focus more on her positives(which she actually suggested), get outside and walk, he and wants to practice/increase her gratefulness as we reviewed some strategies that can help her.  Seem to feel more connected today and more willing to follow through on homework outside of sessions.  We also bumped up her next appointment to be seen a week earlier and she felt that is positive.  Talk further about her setting limits with herself regarding finances and some other areas of her life that involve setting limits.  Motivated to continue her journaling.  Discouraged her tendency to compare herself a lot to others.  Needing to decrease her negative self talk.  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  Continued work needed on her thinking and negative ways.  Working to be in a better Human resources officer for herself and admits that is difficult.  Does want to feel better about herself but also sees that some of her behaviors have been self-defeating in a negative way.  "Especially when I am down", the worst-case scenario thinking is worse.  Wants to believe more in herself but that is a struggle understandably when she is more down.  Does feel she is doing okay "most of the time" at work.  Is able to occasionally see some bits of progress but hard to maintain,  which we will address more next session.  Interventions: Cognitive Behavioral Therapy and Ego-Supportive  1.Reduce overall level, frequency, and intensity of the anxiety so that daily functioning is not impaired. 2.Identify the major life conflicts from the past and present that form the basis for present anxiety. 3.Reinforce client's insights into the role of her past emotional pain and present anxiety.  Diagnosis:   ICD-10-CM   1. Generalized anxiety disorder  F41.1      Plan: Patient showing some better  efforts today in session as she worked on her depression, some previous "suicidal thoughts that I ended up talking with my mom about and felt better", and seems to be willing to commit more seriously to the goals that she is to be working on in treatment currently.  Motivation a little bit better today and she stated that she is having no SI and would contact our office or the hospital if that were to happen and she feel out of control.  States that when she talks things through with her mother that does help in times between sessions.  Focus more on some of her positives today and the things that she can do right now that can contribute to her feeling more stable and goal-directed.  She is a patient who responds better to homework both written and behavioral, and seemed to take more interested in this today as we talked about some specifics in terms of her behavior and thought patterns.  Continues work on her negative self talk, comparing self to others, overspending, worrying, and realizing to some degree how self-defeating these are but it is very hard for her to work on changing them.  Did put forth more effort today and was motivated when we discussed homework between sessions.  Heavy emphasis today on some positives for her that can help her and feeling more grounded. Encouraged patient to be practicing the self affirming and positive behaviors as discussed in sessions including: Refrain from using negative assumptions about herself and others, remain in the present focusing what she can control or change, remain in contact with supportive people, look for more positives versus negatives each day, remain on her prescribed medication, practice "the pause" as needed, refrain from self negating, reduce overthinking and over analyzing, challenge and counteract her self-doubt, saying no without feeling guilty, letting go of guilt from the past that can still hold her back now from moving forward, look more  intentionally for things that give her hope, self-assess her functioning occasionally and look more for the positives versus negatives, and recognize the strengths she has working with goal-directed behaviors to move in a direction that supports her improved self-confidence and her overall look into the future.  This patient has made some connection and progress although is challenged in her motivation which we are focusing on in sessions, and she needs to continue working with goal-directed behaviors to move in a more hopeful and healthier direction.  Goal review and progress/challenges noted with patient.  Next appointment within 2 weeks.   Mathis Fare, LCSW

## 2023-04-16 NOTE — Telephone Encounter (Signed)
 Pt brought in renewal FMLA paperwork in to be filled out. She would like to be called when ready for pickup. Pt has paid for the form .

## 2023-04-17 DIAGNOSIS — Z0289 Encounter for other administrative examinations: Secondary | ICD-10-CM

## 2023-04-18 ENCOUNTER — Telehealth: Payer: Self-pay | Admitting: Adult Health

## 2023-04-18 ENCOUNTER — Telehealth: Payer: Self-pay | Admitting: Neurology

## 2023-04-18 NOTE — Telephone Encounter (Signed)
 Called pt per request to pick up FMLA paperwork when complete. At front desk.

## 2023-04-18 NOTE — Telephone Encounter (Signed)
 Pt came in to drop off FMLA papers for Brittany Davila to fill out. She paid $50 cash and I walked all completed papers to POD [address] and put in Sarah bin

## 2023-04-18 NOTE — Telephone Encounter (Signed)
 Renewal paperwork completed and signed. Given to admin staff to contact pt

## 2023-04-19 DIAGNOSIS — Z0289 Encounter for other administrative examinations: Secondary | ICD-10-CM

## 2023-04-23 ENCOUNTER — Ambulatory Visit (INDEPENDENT_AMBULATORY_CARE_PROVIDER_SITE_OTHER): Payer: Self-pay | Admitting: Psychiatry

## 2023-04-23 DIAGNOSIS — F319 Bipolar disorder, unspecified: Secondary | ICD-10-CM

## 2023-04-23 NOTE — Progress Notes (Signed)
  Patient no-showed for appt today.  [place]

## 2023-04-25 ENCOUNTER — Telehealth: Payer: Self-pay | Admitting: *Deleted

## 2023-04-25 NOTE — Telephone Encounter (Signed)
 Pt [employer] form ready form @ front desk

## 2023-04-30 ENCOUNTER — Ambulatory Visit: Payer: 59 | Admitting: Psychiatry

## 2023-05-03 ENCOUNTER — Encounter: Payer: Self-pay | Admitting: Adult Health

## 2023-05-03 ENCOUNTER — Telehealth: Admitting: Adult Health

## 2023-05-03 DIAGNOSIS — F319 Bipolar disorder, unspecified: Secondary | ICD-10-CM | POA: Diagnosis not present

## 2023-05-03 DIAGNOSIS — G4700 Insomnia, unspecified: Secondary | ICD-10-CM

## 2023-05-03 DIAGNOSIS — F411 Generalized anxiety disorder: Secondary | ICD-10-CM

## 2023-05-03 NOTE — Progress Notes (Signed)
 Brittany Davila [id number] 1975-06-06 48 y.o.  Virtual Visit via Video Note  I connected with pt @ on 05/03/23 at  8:00 AM EDT by a video enabled telemedicine application and verified that I am speaking with the correct person using two identifiers.   I discussed the limitations of evaluation and management by telemedicine and the availability of in person appointments. The patient expressed understanding and agreed to proceed.  I discussed the assessment and treatment plan with the patient. The patient was provided an opportunity to ask questions and all were answered. The patient agreed with the plan and demonstrated an understanding of the instructions.   The patient was advised to call back or seek an in-person evaluation if the symptoms worsen or if the condition fails to improve as anticipated.  I provided 25 minutes of non-face-to-face time during this encounter.  The patient was located at home.  The provider was located at [facility].   Dorothyann Gibbs, NP   Subjective:   Patient ID:  Brittany Davila is a 48 y.o. (DOB Apr 05, 1975) female.  Chief Complaint: No chief complaint on file.   HPI Ahmira BRENNA FRIESENHAHN presents for follow-up of GAD, BPD, and insomnia   Describes mood today as "about the same". Pleasant. Flat. Reports tearfulness at times. Mood symptoms - reports decreased depression. Reports anxiety - "most of the time". Denies irritability. Reports panic attacks - one last week. Reports worry, rumination, and over thinking - "that's a constant". Denies recent mood swings. Reports mood as lower. Stating "I feel like I'm starting to come out of this". Feels like current medication regimen is helpful and would like to continue. Takes medications as prescribed.  Energy levels lower. Active, does not have a regular exercise routine.  Enjoys some usual interests and activities. Married. Lives with husband and 73 year-old daughter. Spending time with family. Appetite adequate.  Weight stable - 220 pounds. Sleep is variable. Averages 5 to 6 hours a night. Reports difficulties with focus and concentration - hasn't gotten any better. Completing tasks. Managing aspects of household. 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 Denies SI or HI.  Denies AH or VH. Denies self harm. Denies substance use.   Review of Systems:  Review of Systems  Musculoskeletal:  Negative for gait problem.  Neurological:  Negative for tremors.  Psychiatric/Behavioral:         Please refer to HPI    Medications: I have reviewed the patient's current medications.  Current Outpatient Medications  Medication Sig Dispense Refill   ALPRAZolam (XANAX) 1 MG tablet TAKE ONE HALF TABLET BY MOUTH 2 TIMES DAILY 30 tablet 2   Atogepant (QULIPTA) 60 MG TABS Take 1 tablet (60 mg total) by mouth daily. 30 tablet 11   cariprazine (VRAYLAR) 3 MG capsule Take 1 capsule (3 mg total) by mouth daily. 30 capsule 5   gabapentin (NEURONTIN) 100 MG capsule Take two capsules three times daily. 180 capsule 5   lamoTRIgine (LAMICTAL) 200 MG tablet TAKE 1 TABLET BY MOUTH EVERY DAY 90 tablet 1   lamoTRIgine (LAMICTAL) 25 MG tablet Take 2 tablets (50 mg total) by mouth every morning. 180 tablet 1   ondansetron (ZOFRAN) 4 MG tablet Take 1 tablet (4 mg total) by mouth every 8 (eight) hours as needed for nausea or vomiting. 20 tablet 1   Rimegepant Sulfate (NURTEC) 75 MG TBDP Take 1 tablet (75 mg total) by mouth as needed. 8 tablet 11   rizatriptan (MAXALT-MLT) 10 MG disintegrating  tablet Take 1 tablet (10 mg total) by mouth as needed for migraine. May repeat in 2 hours if needed 9 tablet 11   Ubrogepant (UBRELVY) 100 MG TABS Take 1 tablet (100 mg total) by mouth as needed.     zolpidem (AMBIEN CR) 12.5 MG CR tablet Take 1 tablet (12.5 mg total) by mouth at bedtime. 30 tablet 2   No current facility-administered medications for this visit.    Medication Side Effects: None  Allergies:  Allergies  Allergen  Reactions   Pollen Extract Other (See Comments)   Progesterone Other (See Comments)    Worsening depression   Soy Allergy (Obsolete) Nausea Only and Other (See Comments)    intolerance  Other Reaction(s): Other (See Comments)    Past Medical History:  Diagnosis Date   Anxiety    Basal cell carcinoma (BCC) of face    Bell's palsy    H/O   Bipolar 1 disorder (HCC)    Depression    Headache    MIGRAINES   History of genital warts     Family History  Problem Relation Age of Onset   Diabetes Maternal Grandfather    Breast cancer Paternal Grandmother 46   Colon cancer Paternal Grandfather 71   Pancreatic cancer Paternal Grandfather    Depression Mother    Depression Father    Lung cancer Maternal Aunt     Social History   Socioeconomic History   Marital status: Married    Spouse name: Maisie Fus   Number of children: 1   Years of education: Grad school   Highest education level: Bachelor's degree (e.g., BA, AB, BS)  Occupational History    Employer: [employer]   Occupation: [employer]  Tobacco Use   Smoking status: Never   Smokeless tobacco: Never  Vaping Use   Vaping status: Never Used  Substance and Sexual Activity   Alcohol use: Not Currently    Alcohol/week: 1.0 standard drink of alcohol    Types: 1 Standard drinks or equivalent per week    Comment: 1-2 times a month   Drug use: No   Sexual activity: Yes    Partners: Male    Birth control/protection: Surgical  Other Topics Concern   Not on file  Social History Narrative   10/13/19   From: [place], [place]   Living: with husband, Cheryln Manly) - 2005   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      Family: Control and instrumentation engineer (2008)      Enjoys: watch TV - [place]      Exercise: not currently   Diet: not great - meat - starch veggie, fast food - twice daily      Safety   Seat belts: Yes    Guns: Yes  and secure   Safe in relationships: Yes    Social Drivers of Health   Financial Resource Strain: Low Risk   (12/18/2022)   Received from [facility]   Overall Financial Resource Strain (CARDIA)    Difficulty of Paying Living Expenses: Not hard at all  Food Insecurity: No Food Insecurity (12/18/2022)   Received from [facility]   Hunger Vital Sign    Worried About Running Out of Food in the Last Year: Never true    Ran Out of Food in the Last Year: Never true  Transportation Needs: No Transportation Needs (12/18/2022)   Received from [facility] - Transportation    In the past 12 months,  has lack of transportation kept you from medical appointments or from getting medications?: No    Lack of Transportation (Non-Medical): No  Physical Activity: Not on file  Stress: Not on file  Social Connections: Not on file  Intimate Partner Violence: Not on file    Past Medical History, Surgical history, Social history, and Family history were reviewed and updated as appropriate.   Please see review of systems for further details on the patient's review from today.   Objective:   Physical Exam:  LMP 08/08/2021 Comment: Menopause stages  Physical Exam Constitutional:      General: She is not in acute distress. Musculoskeletal:        General: No deformity.  Neurological:     Mental Status: She is alert and oriented to person, place, and time.     Coordination: Coordination normal.  Psychiatric:        Attention and Perception: Attention and perception normal. She does not perceive auditory or visual hallucinations.        Mood and Affect: Mood normal. Affect is not labile, blunt, angry or inappropriate.        Speech: Speech normal.        Behavior: Behavior normal.        Thought Content: Thought content normal. Thought content is not paranoid or delusional. Thought content does not include homicidal or suicidal ideation. Thought content does not include homicidal or suicidal plan.        Cognition and Memory: Cognition and memory  normal.        Judgment: Judgment normal.     Comments: Insight intact     Lab Review:     Component Value Date/Time   NA 140 07/08/2021 0904   K 3.8 07/08/2021 0904   CL 106 07/08/2021 0904   CO2 28 07/08/2021 0904   GLUCOSE 76 07/08/2021 0904   BUN 10 07/08/2021 0904   CREATININE 0.90 07/08/2021 0904   CALCIUM 9.4 07/08/2021 0904   PROT 6.9 07/08/2021 0904   ALBUMIN 4.2 07/08/2021 0904   AST 12 07/08/2021 0904   ALT 10 07/08/2021 0904   ALKPHOS 60 07/08/2021 0904   BILITOT 0.4 07/08/2021 0904   GFRNONAA >60 02/18/2017 1320   GFRAA >60 02/18/2017 1320       Component Value Date/Time   WBC 5.6 02/20/2022 0847   RBC 4.79 02/20/2022 0847   HGB 14.3 02/20/2022 0847   HCT 43.5 02/20/2022 0847   PLT 311.0 02/20/2022 0847   MCV 90.9 02/20/2022 0847   MCH 30.6 02/18/2017 1320   MCHC 32.8 02/20/2022 0847   RDW 13.1 02/20/2022 0847   LYMPHSABS 2.2 02/20/2022 0847   MONOABS 0.5 02/20/2022 0847   EOSABS 0.1 02/20/2022 0847   BASOSABS 0.0 02/20/2022 0847    No results found for: "POCLITH", "LITHIUM"   No results found for: "PHENYTOIN", "PHENOBARB", "VALPROATE", "CBMZ"   .res Assessment: Plan:    Plan:  Ambien CR 12.5mg  at hs Xanax 1mg  BID  Lamictal 200mg  at hs Lamictal 25 - 2 every morning. Vraylar 3mg  daily  Restarted magnesium  Continue FMLA - recently updated for 2025  RTC 3 months  25 minutes spent dedicated to the care of this patient on the date of this encounter to include pre-visit review of records, ordering of medication, post visit documentation, and face-to-face time with the patient discussing BPD, GAD and insomnia. Discussed continuing current medication regimen.  Patient advised to contact office with any questions, adverse effects, or acute worsening  in signs and symptoms.  Discussed potential benefits, risk, and side effects of benzodiazepines to include potential risk of tolerance and dependence, as well as possible drowsiness.  Advised  patient not to drive if experiencing drowsiness and to take lowest possible effective dose to minimize risk of dependence and tolerance.  Discussed potential metabolic side effects associated with atypical antipsychotics, as well as potential risk for movement side effects. Advised pt to contact office if movement side effects occur.  There are no diagnoses linked to this encounter.   Please see After Visit Summary for patient specific instructions.  Future Appointments  Date Time Provider Department Center  05/03/2023  8:00 AM Kacee Sukhu, Thereasa Solo, NP [facility] None  10/31/2023  7:45 AM Glean Salvo, NP [facility] None    No orders of the defined types were placed in this encounter.     -------------------------------

## 2023-05-22 ENCOUNTER — Ambulatory Visit: Payer: BC Managed Care – PPO | Admitting: Neurology

## 2023-06-21 ENCOUNTER — Other Ambulatory Visit: Payer: Self-pay | Admitting: Neurology

## 2023-06-21 NOTE — Telephone Encounter (Signed)
 Last seen on 04/11/23 Follow up scheduled on 10/31/23

## 2023-07-16 ENCOUNTER — Encounter: Payer: Self-pay | Admitting: Neurology

## 2023-07-19 ENCOUNTER — Ambulatory Visit: Admitting: Neurology

## 2023-07-19 ENCOUNTER — Encounter: Payer: Self-pay | Admitting: Neurology

## 2023-07-19 ENCOUNTER — Telehealth: Payer: Self-pay | Admitting: Neurology

## 2023-07-19 VITALS — BP 113/76 | HR 107 | Ht 68.0 in | Wt 218.0 lb

## 2023-07-19 DIAGNOSIS — F32A Depression, unspecified: Secondary | ICD-10-CM

## 2023-07-19 DIAGNOSIS — F319 Bipolar disorder, unspecified: Secondary | ICD-10-CM | POA: Diagnosis not present

## 2023-07-19 DIAGNOSIS — R202 Paresthesia of skin: Secondary | ICD-10-CM | POA: Diagnosis not present

## 2023-07-19 DIAGNOSIS — G43009 Migraine without aura, not intractable, without status migrainosus: Secondary | ICD-10-CM | POA: Diagnosis not present

## 2023-07-19 DIAGNOSIS — F419 Anxiety disorder, unspecified: Secondary | ICD-10-CM

## 2023-07-19 MED ORDER — MECLIZINE HCL 12.5 MG PO TABS: 12.5000 mg | ORAL_TABLET | Freq: Three times a day (TID) | ORAL | 1 refills | Status: AC | PRN

## 2023-07-19 NOTE — Progress Notes (Signed)
 Chart reviewed, agree above plan ?

## 2023-07-19 NOTE — Telephone Encounter (Signed)
 MR brain w/wo scheduled at [facility] for 07/24/23, [employer] state [employer].

## 2023-07-19 NOTE — Patient Instructions (Signed)
 Check MRI brain, check kidney function  Try meclizine for dizziness Try the Epley Maneuver  Continue other medications for now

## 2023-07-19 NOTE — Progress Notes (Signed)
 PATIENT: Brittany Davila DOB: May 02, 1975  REASON FOR VISIT: follow up HISTORY FROM: patient Primary Neurologist: Dr. Gracie Lav   ASSESSMENT AND PLAN 48 y.o. year old female  has a past medical history of Anxiety, Basal cell carcinoma (BCC) of face, Bell's palsy, Bipolar 1 disorder (HCC), Depression, Headache, and History of genital warts. here with:  1.  Chronic migraine headache 2.  Dizziness, Vertigo  3.  Reported paresthesia to the right V1 area  -Describes # 2 and # 3 new over the last month, I do not see any overt weakness or gait instability on exam.  Will check MRI of the brain with and without contrast to evaluate for acute abnormality -Try meclizine 12.5 mg as needed for dizziness, try Epley maneuver for vertigo -Check labs today, metabolic abnormality, B12 deficiency, history of Bell's palsy on the left, evaluate A1c -Migraines under good control, continue Qulipta  60 mg daily -Continue Nurtec 75 mg tablet as needed for acute headache, for severe migraines can combine with Aleve, Zofran  -MRI of the brain was normal September 2020 -Previously tried and failed: Maxalt , Ajovy , Imitrex , propranolol , is not a candidate for Topamax due to history of kidney stones, antidepressants worsen her depression and make her feel suicidal; currently taking Lamictal , gabapentin  -Next steps: Emgality, Zavzpret, Botox, Vyepti  4.  Paresthesia left arm and leg (not discussed today) -Nearly resolved, mostly just in the left leg -EMG evaluation in December 2020 was normal of the left arm and left leg -MRI lumbar spine in November 2019 was normal -MRI of thoracic spine was normal, incidental left renal cystic structure measuring 4.9 cm.  MRI of cervical spine was normal  5.  Bipolar disorder, anxiety -Remains under the care of psychiatry, on Lamictal  and gabapentin , xanax    Has an appointment scheduled in October, can keep this  Orders Placed This Encounter  Procedures   MR BRAIN W WO CONTRAST    Hemoglobin A1c   TSH   Vitamin B12   Vitamin D , 25-hydroxy   CBC with Differential/Platelet   CMP   Meds ordered this encounter  Medications   meclizine (ANTIVERT) 12.5 MG tablet    Sig: Take 1 tablet (12.5 mg total) by mouth 3 (three) times daily as needed for dizziness.    Dispense:  30 tablet    Refill:  1   HISTORY  Brittany Davila is a 48 year old female, seen in request by her primary care physician Dr. Angelica Bard for evaluation of headaches initial evaluation was on September 05, 2018.   I have reviewed and summarized the referring note from the referring physician.  She had a past medical history of bipolar disorder, has been on stable dose of Lamictal  150 mg twice a day, she began to have migraine headaches since 2018, increased frequency over the past 3 months, she complains of dizziness, especially with sudden positional change, such as bending over, losing her balance easily, she also complains of intermittent right face icy cold sensation, numbness, sometimes she feels her body spinning back-and-forth with movement, she also has bilateral frontal retro-orbital area pressure headaches, she has migraine headaches about couple times each week, she describes migraine as right retro-orbital area severe pounding headache with sensitive to light and noise, 2 to 3 days, she has tried Excedrin Migraine and muscle relaxant with some helps, in addition she complains of intermittent left arm and leg pain, low back pain,   Update November 06, 2018 SS: MRI of the brain 10/02/2018 was normal.  Since starting propanolol, is now having less intense, dull headaches, 2-3 times a week.  It does not prevent her from doing anything.  The headache is located right occipitally, spreads forward, Icy/cold sensation to her right face with headache.  She has history of right-sided Bell's palsy when she was in college, feels that same sensation.  Imitrex  has been beneficial, but rarely has to take it.  She will  now take Excedrin Migraine with modest benefit.  She reports stress, works full-time, has a 47 year old, taking 2 college classes.  With her headaches, she reports photophobia, phonophobia, at times nausea.  She denies weakness or drooping of the face.  She reports 2 years ago, she developed left-sided low back pain, numbness to her left leg, pain down her leg, hot/cold sensation to her left calf, behind her left knee.  At times, her left arm will go numb.  She says she feels the best when she is standing.  She has had MRI of her lumbar spine that was normal.  She completed a few sessions of physical therapy that were beneficial.  She presents today for follow-up unaccompanied.   Update March 11, 2019 SS: At last visit propanolol was increased 60 mg twice a day, EMG nerve evaluation in December 2020 was normal of the left upper and lower extremity.  She continues to complain of numbness to her left leg, pain, hot/cold sensation, from her mid calf up to her mid thigh.  She does not experience the pain if she is up moving around.  It occurs during times of sitting.  The numbness to her left arm, is now very rare.  She denies bowel or urinary incontinence.  She has not had any falls.  She denies any back pain.  The physical therapy she did prior, was helpful for the back pain, not the leg symptoms.  Her headaches have improved with propanolol.  She says she may have 1 dull headache a week, that is relieved with Tylenol  or Excedrin Migraine to her right occipital area.  She says also at night when lying flat, on her wedge pillow, she may have numbness to the right side of her face.  She is now taking gabapentin  from her psychiatrist for irritability.  She has Abilify  to take if the gabapentin  does not work.  The gabapentin  has also helped with her leg pain.  She presents today for evaluation unaccompanied.  Update July 12, 2020 SS: Here today for follow-up unaccompanied, left leg paresthesia is 80% resolved.  Now  taking zinc, vitamin D3, B6, B complex.  Did a food sensitivity test, made eliminations.  Has been out of propanolol for several months.  On average 1-2 migraines a week.  Does not like the way Imitrex  makes her feel (dizzy, nauseated, tingling weird, can only take when at home, starts with Excedrin Migraine.  Missing 1 to 2 days a month. Works at [employer] with [employer]. Has FMLA for bipolar disorder.   Saw urology, known left kidney stone, left renal cyst was not mentioned.  Mood is stable, continues to see psychiatry. Feels more agitated with headache.  MRI of thoracic spine was normal, incidental left renal cystic structure measuring 4.9 cm.  MRI of cervical spine was normal.  Update January 18, 2021 SS: Taking propranolol  60 twice daily, daily headache, 1 migraine a week, missing 1 day of work a week. Husbands snoring makes it worse usually has in the morning, the Maxalt  works but makes sleepy. Has FMLA for bipolar  disorder. On hormones progesterone and estradiol for perimenopause. Was tried on antidepressant, make her depressed, almost suicidal. Left sided sensory symptoms essentially resolved.   Update October 18, 2022 SS: has not been seen since Dec 2022. Last seen was given Ajovy , took for 6 months with good benefit, stopped it a lot going on. Is off propranolol . Maxalt  was working well, would see spots, zone out for a little at work, but worked well. Works at [employer] in the office, has changed jobs, more stress, Educational psychologist. 2 migraines weekly, right occipital area, radiates forward, behind right eye. Migraine features, migraine today, wearing sunglasses. Taking excedrin migraine, BC powder, is taking daily. Has daily headache that is mild to moderate, continues on the right side. Has FMLA from psychiatry, misses about 1 day a week of work.   Update April 11, 2023 SS: remains on Ajovy , having about 2-3 migraines a week lately, for last 3 months, we sent in Nurtec. Increase lately.  Nurtec works to dull headache, nauseated. Maxalt  is not helpful. Often wakes up with migraines or develops in the afternoon. Has missed a lot of work due to migraines. Doesn't sleep well, due to anxiety/bipolar. Taking Xanax  often due to anxiety.lately missing 2-3 days a week for migraines. 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 Concerned about staying off balance, last week had vertigo for 2 days. 3 weeks ago lying on couch, really freaked her out, her right leg and foot went numb, she was touch her left leg with her left hand but felt like she was using her right. Has felt nauseated, loses her balance when going up stairs, reports numbness and tingling to right V1 area, muscles feels tired, feels like her right eye looks droopy, area stays numb and tingling. Bipolar is doing well.   REVIEW OF SYSTEMS: Out of a complete 14 system review of symptoms, the patient complains only of the following symptoms, and all other reviewed systems are negative.  See HPI  ALLERGIES: Allergies  Allergen Reactions   Pollen Extract Other (See Comments)   Progesterone Other (See Comments)    Worsening depression   Soy Allergy (Obsolete) Nausea Only and Other (See Comments)    intolerance  Other Reaction(s): Other (See Comments)    HOME MEDICATIONS: Outpatient Medications Prior to Visit  Medication Sig Dispense Refill   ALPRAZolam  (XANAX ) 1 MG tablet TAKE ONE HALF TABLET BY MOUTH 2 TIMES DAILY 30 tablet 2   Atogepant  (QULIPTA ) 60 MG TABS Take 1 tablet (60 mg total) by mouth daily. 30 tablet 11   cariprazine  (VRAYLAR ) 3 MG capsule Take 1 capsule (3 mg total) by mouth daily. 30 capsule 5   gabapentin  (NEURONTIN ) 100 MG capsule Take two capsules three times daily. 180 capsule 5   lamoTRIgine  (LAMICTAL )  200 MG tablet TAKE 1 TABLET BY MOUTH EVERY DAY 90 tablet 1   lamoTRIgine  (LAMICTAL ) 25 MG tablet Take 2 tablets (50 mg total) by mouth every morning. 180 tablet 1   ondansetron  (ZOFRAN ) 4 MG tablet TAKE 1 TABLET BY MOUTH EVERY 8 HOURS AS NEEDED FOR NAUSEA AND VOMITING 20 tablet 1   Rimegepant Sulfate (NURTEC) 75 MG TBDP Take 1 tablet (75 mg total) by mouth as needed. 8 tablet 11   rizatriptan  (MAXALT -MLT) 10 MG disintegrating tablet Take 1  tablet (10 mg total) by mouth as needed for migraine. May repeat in 2 hours if needed 9 tablet 11   Ubrogepant  (UBRELVY ) 100 MG TABS Take 1 tablet (100 mg total) by mouth as needed.     zolpidem  (AMBIEN  CR) 12.5 MG CR tablet Take 1 tablet (12.5 mg total) by mouth at bedtime. 30 tablet 2   No facility-administered medications prior to visit.    PAST MEDICAL HISTORY: Past Medical History:  Diagnosis Date   Anxiety    Basal cell carcinoma (BCC) of face    Bell's palsy    H/O   Bipolar 1 disorder (HCC)    Depression    Headache    MIGRAINES   History of genital warts     PAST SURGICAL HISTORY: Past Surgical History:  Procedure Laterality Date   BREAST BIOPSY Right 07/03/2018   us  bx           heart marker              path pending   BREAST CYST ASPIRATION Left 2014   by Dr. Felipe Horton   LAPAROSCOPIC TUBAL LIGATION Bilateral 11/10/2016   Procedure: LAPAROSCOPIC TUBAL LIGATION;  Surgeon: Prescilla Brod, MD;  Location: [facility] ORS;  Service: Gynecology;  Laterality: Bilateral;   MOHS SURGERY     TONSILLECTOMY AND ADENOIDECTOMY     tubes in right ear      FAMILY HISTORY: Family History  Problem Relation Age of Onset   Diabetes Maternal Grandfather    Breast cancer Paternal Grandmother 13   Colon cancer Paternal Grandfather 57   Pancreatic cancer Paternal Grandfather    Depression Mother    Depression Father    Lung cancer Maternal Aunt     SOCIAL HISTORY: Social History   Socioeconomic History   Marital status: Married    Spouse name:  Andy Bannister   Number of children: 1   Years of education: Grad school   Highest education level: Bachelor's degree (e.g., BA, AB, BS)  Occupational History    Employer: [employer]   Occupation: [employer]  Tobacco Use   Smoking status: Never   Smokeless tobacco: Never  Vaping Use   Vaping status: Never Used  Substance and Sexual Activity   Alcohol use: Not Currently    Alcohol/week: 1.0 standard drink of alcohol    Types: 1 Standard drinks or equivalent per week    Comment: 1-2 times a month   Drug use: No   Sexual activity: Yes    Partners: Male    Birth control/protection: Surgical  Other Topics Concern   Not on file  Social History Narrative   10/13/19   From: [place], [place]   Living: with husband, Lajean Pike) - 2005   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      Family: Control and instrumentation engineer (2008)      Enjoys: watch TV - [place]      Exercise: not currently   Diet: not great - meat - starch veggie, fast food - twice daily      Safety   Seat belts: Yes    Guns: Yes  and secure   Safe in relationships: Yes    Social Drivers of Health   Financial Resource Strain: Low Risk  (12/18/2022)   Received from [facility]   Overall Financial Resource Strain (CARDIA)    Difficulty of Paying Living Expenses: Not hard at all  Food Insecurity: No Food Insecurity (12/18/2022)   Received from [facility]  Hunger Vital Sign    Within the past 12 months, you worried that your food would run out before you got the money to buy more.: Never true    Within the past 12 months, the food you bought just didn't last and you didn't have money to get more.: Never true  Transportation Needs: No Transportation Needs (12/18/2022)   Received from [facility] - Transportation    In the past 12 months, has lack of transportation kept you from medical appointments or from getting medications?: No    Lack of Transportation (Non-Medical): No   Physical Activity: Not on file  Stress: Not on file  Social Connections: Not on file  Intimate Partner Violence: Not on file   PHYSICAL EXAM  Vitals:   07/19/23 0807  BP: 113/76  Pulse: (!) 107  Weight: 218 lb (98.9 kg)  Height: 5' 8 (1.727 m)   Body mass index is 33.15 kg/m.  Generalized: Well developed, in no acute distress, tired, flat affect  Neurological examination  Mentation: Alert oriented to time, place, history taking. Follows all commands speech and language fluent Cranial nerve II-XII: Pupils were equal round reactive to light. Extraocular movements were full, visual field were full on confrontational test. Facial sensation and strength were normal. Head turning and shoulder shrug were normal and symmetric.  No facial droop or ptosis is noted. Motor: The motor testing reveals 5 over 5 strength of all 4 extremities. Good symmetric motor tone is noted throughout.  Sensory: Reports decreased sensation to soft touch to left arm Coordination: Cerebellar testing reveals good finger-nose-finger and heel-to-shin bilaterally.  Gait and station: Gait is normal.  Tandem gait is normal, wearing sandals. Reflexes: Deep tendon reflexes are symmetric and normal bilaterally.   DIAGNOSTIC DATA (LABS, IMAGING, TESTING) - I reviewed patient records, labs, notes, testing and imaging myself where available.  Lab Results  Component Value Date   WBC 5.6 02/20/2022   HGB 14.3 02/20/2022   HCT 43.5 02/20/2022   MCV 90.9 02/20/2022   PLT 311.0 02/20/2022      Component Value Date/Time   NA 140 07/08/2021 0904   K 3.8 07/08/2021 0904   CL 106 07/08/2021 0904   CO2 28 07/08/2021 0904   GLUCOSE 76 07/08/2021 0904   BUN 10 07/08/2021 0904   CREATININE 0.90 07/08/2021 0904   CALCIUM 9.4 07/08/2021 0904   PROT 6.9 07/08/2021 0904   ALBUMIN 4.2 07/08/2021 0904   AST 12 07/08/2021 0904   ALT 10 07/08/2021 0904   ALKPHOS 60 07/08/2021 0904   BILITOT 0.4 07/08/2021 0904   GFRNONAA  >60 02/18/2017 1320   GFRAA >60 02/18/2017 1320   Lab Results  Component Value Date   CHOL 172 07/08/2021   HDL 51.20 07/08/2021   LDLCALC 102 (H) 07/08/2021   TRIG 92.0 07/08/2021   CHOLHDL 3 07/08/2021   Lab Results  Component Value Date   HGBA1C 5.4 07/08/2021   Lab Results  Component Value Date   VITAMINB12 225 02/20/2022   Lab Results  Component Value Date   TSH 2.80 02/20/2022   Jeanmarie Millet, AGNP-C, DNP 07/19/2023, 8:40 AM [facility] [address], [address] [place], [place] [address] [phone]

## 2023-07-20 LAB — COMPREHENSIVE METABOLIC PANEL WITH GFR
ALT: 10 IU/L (ref 0–32)
AST: 11 IU/L (ref 0–40)
Albumin: 4.4 g/dL (ref 3.9–4.9)
Alkaline Phosphatase: 86 IU/L (ref 44–121)
BUN/Creatinine Ratio: 16 (ref 9–23)
BUN: 15 mg/dL (ref 6–24)
Bilirubin Total: 0.4 mg/dL (ref 0.0–1.2)
CO2: 22 mmol/L (ref 20–29)
Calcium: 9.3 mg/dL (ref 8.7–10.2)
Chloride: 105 mmol/L (ref 96–106)
Creatinine, Ser: 0.91 mg/dL (ref 0.57–1.00)
Globulin, Total: 2.5 g/dL (ref 1.5–4.5)
Glucose: 87 mg/dL (ref 70–99)
Potassium: 4.1 mmol/L (ref 3.5–5.2)
Sodium: 141 mmol/L (ref 134–144)
Total Protein: 6.9 g/dL (ref 6.0–8.5)
eGFR: 79 mL/min/{1.73_m2} (ref >59)

## 2023-07-20 LAB — CBC WITH DIFFERENTIAL/PLATELET
Basophils Absolute: 0.1 x10E3/uL (ref 0.0–0.2)
Basos: 1 %
EOS (ABSOLUTE): 0.1 x10E3/uL (ref 0.0–0.4)
Eos: 2 %
Hematocrit: 43.1 % (ref 34.0–46.6)
Hemoglobin: 13.9 g/dL (ref 11.1–15.9)
Immature Grans (Abs): 0 x10E3/uL (ref 0.0–0.1)
Immature Granulocytes: 0 %
Lymphocytes Absolute: 2 x10E3/uL (ref 0.7–3.1)
Lymphs: 39 %
MCH: 31 pg (ref 26.6–33.0)
MCHC: 32.3 g/dL (ref 31.5–35.7)
MCV: 96 fL (ref 79–97)
Monocytes Absolute: 0.5 x10E3/uL (ref 0.1–0.9)
Monocytes: 10 %
Neutrophils Absolute: 2.5 x10E3/uL (ref 1.4–7.0)
Neutrophils: 48 %
Platelets: 299 x10E3/uL (ref 150–450)
RBC: 4.48 x10E6/uL (ref 3.77–5.28)
RDW: 13.2 % (ref 11.7–15.4)
WBC: 5.2 x10E3/uL (ref 3.4–10.8)

## 2023-07-20 LAB — HEMOGLOBIN A1C
Est. average glucose Bld gHb Est-mCnc: 100 mg/dL
Hgb A1c MFr Bld: 5.1 % (ref 4.8–5.6)

## 2023-07-20 LAB — TSH: TSH: 2.08 u[IU]/mL (ref 0.450–4.500)

## 2023-07-20 LAB — VITAMIN B12: Vitamin B-12: 280 pg/mL (ref 232–1245)

## 2023-07-20 LAB — VITAMIN D 25 HYDROXY (VIT D DEFICIENCY, FRACTURES): Vit D, 25-Hydroxy: 40.2 ng/mL (ref 30.0–100.0)

## 2023-07-23 ENCOUNTER — Ambulatory Visit: Payer: Self-pay | Admitting: Neurology

## 2023-07-24 ENCOUNTER — Other Ambulatory Visit: Payer: Self-pay | Admitting: Neurology

## 2023-07-24 ENCOUNTER — Ambulatory Visit

## 2023-07-24 DIAGNOSIS — F32A Depression, unspecified: Secondary | ICD-10-CM

## 2023-07-24 DIAGNOSIS — G43009 Migraine without aura, not intractable, without status migrainosus: Secondary | ICD-10-CM

## 2023-07-24 DIAGNOSIS — F319 Bipolar disorder, unspecified: Secondary | ICD-10-CM

## 2023-07-24 DIAGNOSIS — R202 Paresthesia of skin: Secondary | ICD-10-CM

## 2023-07-30 ENCOUNTER — Telehealth: Payer: Self-pay | Admitting: Neurology

## 2023-07-30 ENCOUNTER — Ambulatory Visit: Payer: Self-pay | Admitting: Neurology

## 2023-07-30 DIAGNOSIS — R202 Paresthesia of skin: Secondary | ICD-10-CM

## 2023-07-30 DIAGNOSIS — G43009 Migraine without aura, not intractable, without status migrainosus: Secondary | ICD-10-CM

## 2023-07-30 DIAGNOSIS — G510 Bell's palsy: Secondary | ICD-10-CM

## 2023-07-30 NOTE — Telephone Encounter (Signed)
 Please call, patient had MRI brain (without contrast, but I ordered with and without) it showed 8 x 10 mm white matter area to the left parietal periventricular area. We need to do MRI again with contrast for further evaluation. Can we do this week? Thanks  IMPRESSION: MRI scan of the brain without contrast showing a 8 x 10 mm T2/FLAIR white matter hyperintense lesion in the left parietal periventricular white matter of indeterminate etiology.  This was not seen on previous MRI from 10/02/2018 and may represent age-indeterminate infarct or demyelinating lesion.  Recommend postcontrast as well as interval follow-up imaging.   Orders Placed This Encounter  Procedures   MR BRAIN W CONTRAST

## 2023-07-30 NOTE — Telephone Encounter (Signed)
 Patient had MRI of brain wo on June 24th at [address], showed a new left parietal periventricular lesion   original order was with contrast, she has norma kidney function  Please talk with MRI staff to complete MRI brain w contrast

## 2023-07-30 NOTE — Telephone Encounter (Signed)
 She was scheduled to get the contrast on June 24 so I am not sure why they didn't do it.

## 2023-07-30 NOTE — Telephone Encounter (Signed)
 Call to patient, she is agreeable to MRI tomorrow at 11. Reviewed MRI protocol and advised to drink plenty of fluids. Patient verbalized understanding

## 2023-07-31 ENCOUNTER — Telehealth: Payer: Self-pay | Admitting: Neurology

## 2023-07-31 ENCOUNTER — Ambulatory Visit

## 2023-07-31 DIAGNOSIS — G510 Bell's palsy: Secondary | ICD-10-CM

## 2023-07-31 MED ORDER — GADOBENATE DIMEGLUMINE 529 MG/ML IV SOLN
20.0000 mL | Freq: Once | INTRAVENOUS | Status: AC | PRN
  Administered 2023-07-31: 20 mL via INTRAVENOUS

## 2023-07-31 NOTE — Telephone Encounter (Signed)
 I called the patient.  MRI of the brain with contrast showed left parietal periventricular lesion without enhancement and of unclear etiology.  Reviewed with Dr. Onita. Likely not CVA. Could be from migraine, remote possibly of MS. Her right sided facial tingling has resolved.   We will certainly plan to repeat an MRI of the brain with and without contrast in 1 year.  I have a follow-up appointment with her in October, reviewed that we could consider a lumbar puncture and MS panel with CSF for further evaluation of MS.  If she has any other neurological symptoms she is to contact me.

## 2023-08-07 NOTE — Telephone Encounter (Signed)
 I called the patient. She would like to pursue LP for evaluation of possible MS. I will place the orders.   Orders Placed This Encounter  Procedures   DG FL GUIDED LUMBAR PUNCTURE

## 2023-08-18 ENCOUNTER — Other Ambulatory Visit: Payer: Self-pay | Admitting: Adult Health

## 2023-08-18 DIAGNOSIS — F411 Generalized anxiety disorder: Secondary | ICD-10-CM

## 2023-08-19 NOTE — Telephone Encounter (Signed)
Sent MyChart message to schedule FU.

## 2023-09-04 ENCOUNTER — Encounter: Payer: Self-pay | Admitting: Adult Health

## 2023-09-04 ENCOUNTER — Telehealth (INDEPENDENT_AMBULATORY_CARE_PROVIDER_SITE_OTHER): Admitting: Adult Health

## 2023-09-04 DIAGNOSIS — G4700 Insomnia, unspecified: Secondary | ICD-10-CM | POA: Diagnosis not present

## 2023-09-04 DIAGNOSIS — F411 Generalized anxiety disorder: Secondary | ICD-10-CM

## 2023-09-04 DIAGNOSIS — F319 Bipolar disorder, unspecified: Secondary | ICD-10-CM

## 2023-09-04 MED ORDER — CARIPRAZINE HCL 3 MG PO CAPS: 3.0000 mg | ORAL_CAPSULE | Freq: Every day | ORAL | 5 refills | Status: DC

## 2023-09-04 MED ORDER — LAMOTRIGINE 100 MG PO TABS: 100.0000 mg | ORAL_TABLET | Freq: Every morning | ORAL | 5 refills | Status: DC

## 2023-09-04 MED ORDER — ZOLPIDEM TARTRATE ER 12.5 MG PO TBCR: 12.5000 mg | EXTENDED_RELEASE_TABLET | Freq: Every day | ORAL | 2 refills | Status: DC

## 2023-09-04 MED ORDER — LAMOTRIGINE 25 MG PO TABS
50.0000 mg | ORAL_TABLET | Freq: Every morning | ORAL | 1 refills | Status: DC
Start: 2023-09-04 — End: 2023-09-04

## 2023-09-04 MED ORDER — LAMOTRIGINE 200 MG PO TABS: ORAL_TABLET | ORAL | 1 refills | Status: DC

## 2023-09-04 MED ORDER — ALPRAZOLAM 1 MG PO TABS: ORAL_TABLET | ORAL | 1 refills | Status: DC

## 2023-09-04 NOTE — Progress Notes (Signed)
 Brittany Davila [id number] [birth year] 48 y.o.  Virtual Visit via Video Note  I connected with pt @ on 09/04/23 at  9:30 AM EDT by a video enabled telemedicine application and verified that I am speaking with the correct person using two identifiers.   I discussed the limitations of evaluation and management by telemedicine and the availability of in person appointments. The patient expressed understanding and agreed to proceed.  I discussed the assessment and treatment plan with the patient. The patient was provided an opportunity to ask questions and all were answered. The patient agreed with the plan and demonstrated an understanding of the instructions.   The patient was advised to call back or seek an in-person evaluation if the symptoms worsen or if the condition fails to improve as anticipated.  I provided 25 minutes of non-face-to-face time during this encounter.  The patient was located at home.  The provider was located at [facility].   Angeline LOISE Sayers, NP   Subjective:   Patient ID:  Brittany Davila is a 48 y.o. (DOB 20-Jul-1975) female.  Chief Complaint: No chief complaint on file.   HPI Brittany Davila presents for follow-up of GAD, BPD, and insomnia   Describes mood today as about the same. Pleasant. Flat. Reports tearfulness at times. Mood symptoms - reports depression. Reports lower interest and motivation. Reports anxiety - most of the time. Denies irritability. Reports panic attacks - last one 2 weeks ago. Reports worry, rumination, and over thinking. Reports some recent health issues - having to miss a lot of work. Reports increased migraines - up to twice a week. Denies recent mood swings. Reports mood as stable. Stating I feel like I'm not doing too good - feels overwhelmed with life - medical issues, husband, daughter going to college. Feels like current medication regimen is helpful, but would like to consider other options. Takes medications as prescribed.   Energy levels lower. Active, does not have a regular exercise routine.  Enjoys some usual interests and activities. Married. Lives with husband and 4 year-old daughter. Spending time with family. Appetite adequate. Weight stable - 220 pounds. Sleep is variable. Averages 6 to 7 hours a night. Reports difficulties with focus and concentration. Completing tasks. Managing aspects of household. Works full time Psychologist, prison and probation services. Denies SI or HI.  Denies AH or VH. Denies self harm. Denies substance use.  Previous medication trials: Abilify  Rexulti  Gabapentin   Review of Systems:  Review of Systems  Musculoskeletal:  Negative for gait problem.  Neurological:  Negative for tremors.  Psychiatric/Behavioral:         Please refer to HPI    Medications: I have reviewed the patient's current medications.  Current Outpatient Medications  Medication Sig Dispense Refill   ALPRAZolam  (XANAX ) 1 MG tablet TAKE ONE TABLET BY MOUTH 2 TIMES DAILY 60 tablet 1   Atogepant  (QULIPTA ) 60 MG TABS Take 1 tablet (60 mg total) by mouth daily. 30 tablet 11   cariprazine  (VRAYLAR ) 3 MG capsule Take 1 capsule (3 mg total) by mouth daily. 30 capsule 5   gabapentin  (NEURONTIN ) 100 MG capsule Take two capsules three times daily. 180 capsule 5   lamoTRIgine  (LAMICTAL ) 100 MG tablet Take 1 tablet (100 mg total) by mouth every morning. 30 tablet 5   lamoTRIgine  (LAMICTAL ) 200 MG tablet TAKE 1 TABLET BY MOUTH EVERY DAY 90 tablet 1   meclizine  (ANTIVERT ) 12.5 MG tablet Take 1 tablet (12.5 mg total) by mouth 3 (three) times daily as needed  for dizziness. 30 tablet 1   ondansetron  (ZOFRAN ) 4 MG tablet TAKE 1 TABLET BY MOUTH EVERY 8 HOURS AS NEEDED FOR NAUSEA AND VOMITING 20 tablet 1   Rimegepant Sulfate (NURTEC) 75 MG TBDP Take 1 tablet (75 mg total) by mouth as needed. 8 tablet 11   rizatriptan  (MAXALT -MLT) 10 MG disintegrating tablet Take 1 tablet (10 mg total) by mouth as needed for migraine. May repeat in 2 hours if  needed 9 tablet 11   Ubrogepant  (UBRELVY ) 100 MG TABS Take 1 tablet (100 mg total) by mouth as needed.     zolpidem  (AMBIEN  CR) 12.5 MG CR tablet Take 1 tablet (12.5 mg total) by mouth at bedtime. 30 tablet 2   No current facility-administered medications for this visit.    Medication Side Effects: None  Allergies:  Allergies  Allergen Reactions   Pollen Extract Other (See Comments)   Progesterone Other (See Comments)    Worsening depression   Soy Allergy (Obsolete) Nausea Only and Other (See Comments)    intolerance  Other Reaction(s): Other (See Comments)    Past Medical History:  Diagnosis Date   Anxiety    Basal cell carcinoma (BCC) of face    Bell's palsy    H/O   Bipolar 1 disorder (HCC)    Depression    Headache    MIGRAINES   History of genital warts     Family History  Problem Relation Age of Onset   Diabetes Maternal Grandfather    Breast cancer Paternal Grandmother 51   Colon cancer Paternal Grandfather 29   Pancreatic cancer Paternal Grandfather    Depression Mother    Depression Father    Lung cancer Maternal Aunt     Social History   Socioeconomic History   Marital status: Married    Spouse name: Debby   Number of children: 1   Years of education: Grad school   Highest education level: Bachelor's degree (e.g., BA, AB, BS)  Occupational History    Employer: [employer]   Occupation: [employer]  Tobacco Use   Smoking status: Never   Smokeless tobacco: Never  Vaping Use   Vaping status: Never Used  Substance and Sexual Activity   Alcohol use: Not Currently    Alcohol/week: 1.0 standard drink of alcohol    Types: 1 Standard drinks or equivalent per week    Comment: 1-2 times a month   Drug use: No   Sexual activity: Yes    Partners: Male    Birth control/protection: Surgical  Other Topics Concern   Not on file  Social History Narrative   10/13/19   From: [place], [place]   Living: with husband, Debby Concha) - 2005   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      Family: Control and instrumentation engineer (2008)      Enjoys: watch TV - [place]      Exercise: not currently   Diet: not great - meat - starch veggie, fast food - twice daily      Safety   Seat belts: Yes    Guns: Yes  and secure   Safe in relationships: Yes    Social Drivers of Health   Financial Resource Strain: Low Risk  (12/18/2022)   Received from [facility]   Overall Financial Resource Strain (CARDIA)    Difficulty of Paying Living Expenses: Not hard at all  Food Insecurity: No Food Insecurity (12/18/2022)   Received from [facility]   Hunger Vital  Sign    Within the past 12 months, you worried that your food would run out before you got the money to buy more.: Never true    Within the past 12 months, the food you bought just didn't last and you didn't have money to get more.: Never true  Transportation Needs: No Transportation Needs (12/18/2022)   Received from [facility] - [facility]    In the past 12 months, has lack of transportation kept you from medical appointments or from getting medications?: No    Lack of Transportation (Non-Medical): No  Physical Activity: Not on file  Stress: Not on file  Social Connections: Not on file  Intimate Partner Violence: Not on file    Past Medical History, Surgical history, Social history, and Family history were reviewed and updated as appropriate.   Please see review of systems for further details on the patient's review from today.   Objective:   Physical Exam:  LMP 08/08/2021 Comment: Menopause stages  Physical Exam Constitutional:      General: She is not in acute distress. Musculoskeletal:        General: No deformity.  Neurological:     Mental Status: She is alert and oriented to person, place, and time.     Coordination: Coordination normal.  Psychiatric:        Attention and Perception: Attention and perception normal. She does not  perceive auditory or visual hallucinations.        Mood and Affect: Mood is anxious and depressed. Affect is flat. Affect is not labile, blunt, angry or inappropriate.        Speech: Speech normal.        Behavior: Behavior normal.        Thought Content: Thought content normal. Thought content is not paranoid or delusional. Thought content does not include homicidal or suicidal ideation. Thought content does not include homicidal or suicidal plan.        Cognition and Memory: Cognition and memory normal.        Judgment: Judgment normal.     Comments: Insight intact     Lab Review:     Component Value Date/Time   NA 141 07/19/2023 0842   K 4.1 07/19/2023 0842   CL 105 07/19/2023 0842   CO2 22 07/19/2023 0842   GLUCOSE 87 07/19/2023 0842   GLUCOSE 76 07/08/2021 0904   BUN 15 07/19/2023 0842   CREATININE 0.91 07/19/2023 0842   CALCIUM 9.3 07/19/2023 0842   PROT 6.9 07/19/2023 0842   ALBUMIN 4.4 07/19/2023 0842   AST 11 07/19/2023 0842   ALT 10 07/19/2023 0842   ALKPHOS 86 07/19/2023 0842   BILITOT 0.4 07/19/2023 0842   GFRNONAA >60 02/18/2017 1320   GFRAA >60 02/18/2017 1320       Component Value Date/Time   WBC 5.2 07/19/2023 0842   WBC 5.6 02/20/2022 0847   RBC 4.48 07/19/2023 0842   RBC 4.79 02/20/2022 0847   HGB 13.9 07/19/2023 0842   HCT 43.1 07/19/2023 0842   PLT 299 07/19/2023 0842   MCV 96 07/19/2023 0842   MCH 31.0 07/19/2023 0842   MCH 30.6 02/18/2017 1320   MCHC 32.3 07/19/2023 0842   MCHC 32.8 02/20/2022 0847   RDW 13.2 07/19/2023 0842   LYMPHSABS 2.0 07/19/2023 0842   MONOABS 0.5 02/20/2022 0847   EOSABS 0.1 07/19/2023 0842   BASOSABS 0.1 07/19/2023 0842    No results found for: POCLITH, LITHIUM  No results found for: PHENYTOIN, PHENOBARB, VALPROATE, CBMZ   .res Assessment: Plan:    Plan:  Increase Xanax  0.5mg  BID to 1mg  BID for increased anxiety Increase Lamictal  50mg  to 100 every morning for increased depression  Ambien  CR  12.5mg  at hs Lamictal  200mg  at hs Vraylar  3mg  daily  Restarted magnesium  Continue FMLA - recently updated for 2025  RTC 4 weeks  25 minutes spent dedicated to the care of this patient on the date of this encounter to include pre-visit review of records, ordering of medication, post visit documentation, and face-to-face time with the patient discussing BPD, GAD and insomnia. Discussed increasing Xanax  0.5mg  BID to 1mg  BID for increased anxiety. Discussed increasing Lamictal  50mg  to 100 every morning for increased depression  Patient advised to contact office with any questions, adverse effects, or acute worsening in signs and symptoms.  Discussed potential benefits, risk, and side effects of benzodiazepines to include potential risk of tolerance and dependence, as well as possible drowsiness.  Advised patient not to drive if experiencing drowsiness and to take lowest possible effective dose to minimize risk of dependence and tolerance.  Discussed potential metabolic side effects associated with atypical antipsychotics, as well as potential risk for movement side effects. Advised pt to contact office if movement side effects occur.  Diagnoses and all orders for this visit:  Bipolar I disorder (HCC) -     lamoTRIgine  (LAMICTAL ) 200 MG tablet; TAKE 1 TABLET BY MOUTH EVERY DAY -     Discontinue: lamoTRIgine  (LAMICTAL ) 25 MG tablet; Take 2 tablets (50 mg total) by mouth every morning. -     cariprazine  (VRAYLAR ) 3 MG capsule; Take 1 capsule (3 mg total) by mouth daily. -     lamoTRIgine  (LAMICTAL ) 100 MG tablet; Take 1 tablet (100 mg total) by mouth every morning.  Insomnia, unspecified type -     zolpidem  (AMBIEN  CR) 12.5 MG CR tablet; Take 1 tablet (12.5 mg total) by mouth at bedtime.  Generalized anxiety disorder -     ALPRAZolam  (XANAX ) 1 MG tablet; TAKE ONE TABLET BY MOUTH 2 TIMES DAILY     Please see After Visit Summary for patient specific instructions.  Future Appointments   Date Time Provider Department Center  10/05/2023 10:00 AM [facility] RM 3 [facility]  10/31/2023  7:45 AM Gayland Lauraine PARAS, NP [facility] None    No orders of the defined types were placed in this encounter.     -------------------------------

## 2023-09-10 ENCOUNTER — Other Ambulatory Visit (HOSPITAL_BASED_OUTPATIENT_CLINIC_OR_DEPARTMENT_OTHER): Payer: Self-pay

## 2023-09-10 ENCOUNTER — Other Ambulatory Visit (HOSPITAL_COMMUNITY): Payer: Self-pay

## 2023-09-10 ENCOUNTER — Telehealth: Payer: Self-pay | Admitting: Pharmacist

## 2023-09-10 NOTE — Telephone Encounter (Signed)
 Pharmacy Patient Advocate Encounter   Received notification from Patient Pharmacy that prior authorization for Qulipta  60MG  tabletsis required/requested.   Insurance verification completed.   The patient is insured through [facility] .   Per test claim: PA required; PA submitted to above mentioned insurance via CoverMyMeds Key/confirmation #/EOC [id number] Status is pending

## 2023-09-10 NOTE — Telephone Encounter (Signed)
 Pharmacy Patient Advocate Encounter  Received notification from [facility] that Prior Authorization for Qulipta  has been APPROVED from 09/10/2023 to 09/09/2024   PA #/Case ID/Reference #: [id number]

## 2023-09-11 ENCOUNTER — Telehealth: Payer: Self-pay | Admitting: Pharmacist

## 2023-09-11 ENCOUNTER — Other Ambulatory Visit (HOSPITAL_COMMUNITY): Payer: Self-pay

## 2023-09-11 NOTE — Telephone Encounter (Signed)
 Pharmacy Patient Advocate Encounter   Received notification from Patient Pharmacy that prior authorization for Nurtec 75MG  dispersible tablets is required/requested.   Insurance verification completed.   The patient is insured through [facility] .   Per test claim: PA required; PA started via CoverMyMeds. KEY [id number] . Waiting for clinical questions to populate.

## 2023-09-12 NOTE — Telephone Encounter (Signed)
 Clinical questions have been answered and PA submitted. PA currently Pending. Please be advised that most companies allow up to 30 days to make a decision. We will advise when a determination has been made, or follow up in 1 week.   Please reach out to our team, Rx Prior Auth Pool, if you haven't heard back in a week.

## 2023-09-12 NOTE — Telephone Encounter (Signed)
 Pharmacy Patient Advocate Encounter  Received notification from [facility] that Prior Authorization for Nurtec ODT 75mg  has been APPROVED from 09/12/2023 to 09/11/2024   PA #/Case ID/Reference #: [id number]

## 2023-10-04 ENCOUNTER — Telehealth (INDEPENDENT_AMBULATORY_CARE_PROVIDER_SITE_OTHER): Admitting: Adult Health

## 2023-10-04 ENCOUNTER — Encounter: Payer: Self-pay | Admitting: Adult Health

## 2023-10-04 DIAGNOSIS — F411 Generalized anxiety disorder: Secondary | ICD-10-CM

## 2023-10-04 DIAGNOSIS — F319 Bipolar disorder, unspecified: Secondary | ICD-10-CM

## 2023-10-04 DIAGNOSIS — G4700 Insomnia, unspecified: Secondary | ICD-10-CM | POA: Diagnosis not present

## 2023-10-04 NOTE — Discharge Instructions (Signed)

## 2023-10-04 NOTE — Progress Notes (Signed)
 Brittany Davila [id number] [birth year] 48 y.o.  Virtual Visit via Video Note  I connected with pt @ on 10/04/23 at 11:00 AM EDT by a video enabled telemedicine application and verified that I am speaking with the correct person using two identifiers.   I discussed the limitations of evaluation and management by telemedicine and the availability of in person appointments. The patient expressed understanding and agreed to proceed.  I discussed the assessment and treatment plan with the patient. The patient was provided an opportunity to ask questions and all were answered. The patient agreed with the plan and demonstrated an understanding of the instructions.   The patient was advised to call back or seek an in-person evaluation if the symptoms worsen or if the condition fails to improve as anticipated.  I provided 15 minutes of non-face-to-face time during this encounter.  The patient was located at home.  The provider was located at [facility].   Brittany LOISE Sayers, NP   Subjective:   Patient ID:  Brittany Davila is a 48 y.o. (DOB 1975-02-22) female.  Chief Complaint: No chief complaint on file.   HPI Brittany Davila presents for follow-up of GAD, BPD, and insomnia   Describes mood today as a little better. Pleasant. Flat. Denies tearfulness at times. Mood symptoms - reports decreased depression - not as consistent. Reports lower interest and motivation - hard to get motivated Reports decreased anxiety - it's better. Denies irritability. Reports panic attacks - on the verge at times. Reports worry, rumination and over thinking. Reports some recent health issues. Reports increased migraines - up to twice a week. Denies recent mood swings. Reports mood as stable. Stating I feel like I'm doing a little better, but still feeling overwhelmed. Feels like current medication regimen is helpful. Takes medications as prescribed.  Energy levels lower - feels fatigued all the time. Active,  does not have a regular exercise routine.  Enjoys some usual interests and activities. Married. Lives with husband and 57 year - old daughter. Spending time with family. Appetite adequate. Weight stable - 220 pounds. Sleep is variable. Averages 6 hours a night over the past 2 weeks. Reports difficulties with focus and concentration - a little distracted with upcoming lumbar procedure. Completing tasks. Managing aspects of household. Works full time Psychologist, prison and probation services. Denies SI or HI.  Denies AH or VH. Denies self harm. Denies substance use.  Previous medication trials: Abilify  Rexulti  Gabapentin     Review of Systems:  Review of Systems  Musculoskeletal:  Negative for gait problem.  Neurological:  Negative for tremors.  Psychiatric/Behavioral:         Please refer to HPI    Medications: I have reviewed the patient's current medications.  Current Outpatient Medications  Medication Sig Dispense Refill   ALPRAZolam  (XANAX ) 1 MG tablet TAKE ONE TABLET BY MOUTH 2 TIMES DAILY 60 tablet 1   Atogepant  (QULIPTA ) 60 MG TABS Take 1 tablet (60 mg total) by mouth daily. 30 tablet 11   cariprazine  (VRAYLAR ) 3 MG capsule Take 1 capsule (3 mg total) by mouth daily. 30 capsule 5   gabapentin  (NEURONTIN ) 100 MG capsule Take two capsules three times daily. 180 capsule 5   lamoTRIgine  (LAMICTAL ) 100 MG tablet Take 1 tablet (100 mg total) by mouth every morning. 30 tablet 5   lamoTRIgine  (LAMICTAL ) 200 MG tablet TAKE 1 TABLET BY MOUTH EVERY DAY 90 tablet 1   meclizine  (ANTIVERT ) 12.5 MG tablet Take 1 tablet (12.5 mg total) by mouth 3 (  three) times daily as needed for dizziness. 30 tablet 1   ondansetron  (ZOFRAN ) 4 MG tablet TAKE 1 TABLET BY MOUTH EVERY 8 HOURS AS NEEDED FOR NAUSEA AND VOMITING 20 tablet 1   Rimegepant Sulfate (NURTEC) 75 MG TBDP Take 1 tablet (75 mg total) by mouth as needed. 8 tablet 11   rizatriptan  (MAXALT -MLT) 10 MG disintegrating tablet Take 1 tablet (10 mg total) by mouth as  needed for migraine. May repeat in 2 hours if needed 9 tablet 11   Ubrogepant  (UBRELVY ) 100 MG TABS Take 1 tablet (100 mg total) by mouth as needed.     zolpidem  (AMBIEN  CR) 12.5 MG CR tablet Take 1 tablet (12.5 mg total) by mouth at bedtime. 30 tablet 2   No current facility-administered medications for this visit.    Medication Side Effects: None  Allergies:  Allergies  Allergen Reactions   Pollen Extract Other (See Comments)   Progesterone Other (See Comments)    Worsening depression   Soy Allergy (Obsolete) Nausea Only and Other (See Comments)    intolerance  Other Reaction(s): Other (See Comments)    Past Medical History:  Diagnosis Date   Anxiety    Basal cell carcinoma (BCC) of face    Bell's palsy    H/O   Bipolar 1 disorder (HCC)    Depression    Headache    MIGRAINES   History of genital warts     Family History  Problem Relation Age of Onset   Diabetes Maternal Grandfather    Breast cancer Paternal Grandmother 58   Colon cancer Paternal Grandfather 23   Pancreatic cancer Paternal Grandfather    Depression Mother    Depression Father    Lung cancer Maternal Aunt     Social History   Socioeconomic History   Marital status: Married    Spouse name: Debby   Number of children: 1   Years of education: Grad school   Highest education level: Bachelor's degree (e.g., BA, AB, BS)  Occupational History    Employer: [employer]   Occupation: [employer]  Tobacco Use   Smoking status: Never   Smokeless tobacco: Never  Vaping Use   Vaping status: Never Used  Substance and Sexual Activity   Alcohol use: Not Currently    Alcohol/week: 1.0 standard drink of alcohol    Types: 1 Standard drinks or equivalent per week    Comment: 1-2 times a month   Drug use: No   Sexual activity: Yes    Partners: Male    Birth control/protection: Surgical  Other Topics Concern   Not on file  Social History Narrative   10/13/19   From: [place], [place]   Living: with  husband, Debby Concha) - 2005   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      Family: Control and instrumentation engineer (2008)      Enjoys: watch TV - [place]      Exercise: not currently   Diet: not great - meat - starch veggie, fast food - twice daily      Safety   Seat belts: Yes    Guns: Yes  and secure   Safe in relationships: Yes    Social Drivers of Health   Financial Resource Strain: Low Risk  (12/18/2022)   Received from [facility]   Overall Financial Resource Strain (CARDIA)    Difficulty of Paying Living Expenses: Not hard at all  Food Insecurity: No Food Insecurity (12/18/2022)   Received from [facility]   Hunger Vital Sign    Within the past 12 months, you worried that your food would run out before you got the money to buy more.: Never true    Within the past 12 months, the food you bought just didn't last and you didn't have money to get more.: Never true  Transportation Needs: No Transportation Needs (12/18/2022)   Received from [facility] - Transportation    In the past 12 months, has lack of transportation kept you from medical appointments or from getting medications?: No    Lack of Transportation (Non-Medical): No  Physical Activity: Not on file  Stress: Not on file  Social Connections: Not on file  Intimate Partner Violence: Not on file    Past Medical History, Surgical history, Social history, and Family history were reviewed and updated as appropriate.   Please see review of systems for further details on the patient's review from today.   Objective:   Physical Exam:  LMP 08/08/2021 Comment: Menopause stages  Physical Exam Constitutional:      General: She is not in acute distress. Musculoskeletal:        General: No deformity.  Neurological:     Mental Status: She is alert and oriented to person, place, and time.     Coordination: Coordination normal.  Psychiatric:        Attention and Perception:  Attention and perception normal. She does not perceive auditory or visual hallucinations.        Mood and Affect: Mood normal. Mood is not anxious or depressed. Affect is not labile, blunt, angry or inappropriate.        Speech: Speech normal.        Behavior: Behavior normal.        Thought Content: Thought content normal. Thought content is not paranoid or delusional. Thought content does not include homicidal or suicidal ideation. Thought content does not include homicidal or suicidal plan.        Cognition and Memory: Cognition and memory normal.        Judgment: Judgment normal.     Comments: Insight intact     Lab Review:     Component Value Date/Time   NA 141 07/19/2023 0842   K 4.1 07/19/2023 0842   CL 105 07/19/2023 0842   CO2 22 07/19/2023 0842   GLUCOSE 87 07/19/2023 0842   GLUCOSE 76 07/08/2021 0904   BUN 15 07/19/2023 0842   CREATININE 0.91 07/19/2023 0842   CALCIUM 9.3 07/19/2023 0842   PROT 6.9 07/19/2023 0842   ALBUMIN 4.4 07/19/2023 0842   AST 11 07/19/2023 0842   ALT 10 07/19/2023 0842   ALKPHOS 86 07/19/2023 0842   BILITOT 0.4 07/19/2023 0842   GFRNONAA >60 02/18/2017 1320   GFRAA >60 02/18/2017 1320       Component Value Date/Time   WBC 5.2 07/19/2023 0842   WBC 5.6 02/20/2022 0847   RBC 4.48 07/19/2023 0842   RBC 4.79 02/20/2022 0847   HGB 13.9 07/19/2023 0842   HCT 43.1 07/19/2023 0842   PLT 299 07/19/2023 0842   MCV 96 07/19/2023 0842   MCH 31.0 07/19/2023 0842   MCH 30.6 02/18/2017 1320   MCHC 32.3 07/19/2023 0842   MCHC 32.8 02/20/2022 0847   RDW 13.2 07/19/2023 0842   LYMPHSABS 2.0 07/19/2023 0842   MONOABS 0.5 02/20/2022 0847   EOSABS 0.1 07/19/2023 0842   BASOSABS 0.1 07/19/2023 0842    No  results found for: POCLITH, LITHIUM   No results found for: PHENYTOIN, PHENOBARB, VALPROATE, CBMZ   .res Assessment: Plan:    Plan:  Xanax  1mg  BID for increased anxiety Lamictal  100 every morning for increased depression Ambien   CR 12.5mg  at hs Lamictal  200mg  at hs Vraylar  3mg  daily  Restarted magnesium  Continue FMLA - recently updated for 2025  RTC 3 months  15 minutes spent dedicated to the care of this patient on the date of this encounter to include pre-visit review of records, ordering of medication, post visit documentation, and face-to-face time with the patient discussing BPD, GAD and insomnia. Discussed continuing current medication regimen.  Patient advised to contact office with any questions, adverse effects, or acute worsening in signs and symptoms.  Discussed potential benefits, risk, and side effects of benzodiazepines to include potential risk of tolerance and dependence, as well as possible drowsiness.  Advised patient not to drive if experiencing drowsiness and to take lowest possible effective dose to minimize risk of dependence and tolerance.  Discussed potential metabolic side effects associated with atypical antipsychotics, as well as potential risk for movement side effects. Advised pt to contact office if movement side effects occur. Diagnoses and all orders for this visit:  Bipolar I disorder (HCC)  Insomnia, unspecified type  Generalized anxiety disorder     Please see After Visit Summary for patient specific instructions.  Future Appointments  Date Time Provider Department Center  10/05/2023 10:00 AM [facility] RM 3 [facility]  10/31/2023  7:45 AM Gayland Lauraine PARAS, NP [facility] None    No orders of the defined types were placed in this encounter.     -------------------------------

## 2023-10-05 ENCOUNTER — Ambulatory Visit
Admission: RE | Admit: 2023-10-05 | Discharge: 2023-10-05 | Disposition: A | Discharge status: Home / Self Care | Payer: Self-pay | Source: Ambulatory Visit | Attending: Neurology | Admitting: Neurology

## 2023-10-05 VITALS — BP 116/66 | HR 68

## 2023-10-05 DIAGNOSIS — G43009 Migraine without aura, not intractable, without status migrainosus: Secondary | ICD-10-CM

## 2023-10-05 DIAGNOSIS — H903 Sensorineural hearing loss, bilateral: Secondary | ICD-10-CM

## 2023-10-05 DIAGNOSIS — H9313 Tinnitus, bilateral: Secondary | ICD-10-CM

## 2023-10-05 DIAGNOSIS — R202 Paresthesia of skin: Secondary | ICD-10-CM

## 2023-10-05 NOTE — Progress Notes (Signed)
 1 vial of blood drawn from pts Left FA to be sent off with LP lab work. 1 successful attempt, pt tolerated well. Gauze and tape applied after.

## 2023-10-09 ENCOUNTER — Encounter: Payer: Self-pay | Admitting: Neurology

## 2023-10-10 ENCOUNTER — Ambulatory Visit: Payer: Self-pay | Admitting: Neurology

## 2023-10-10 LAB — CSF CULTURE W GRAM STAIN
GRAM STAIN:: NONE SEEN
MICRO NUMBER:: 16928716
Result:: NO GROWTH
SPECIMEN QUALITY:: ADEQUATE

## 2023-10-10 LAB — MULTIPLE SCLEROSIS PANEL 2
Albumin Serum: 4.9 g/dL (ref 3.6–5.1)
Albumin, CSF: 22.3 mg/dL (ref 8.0–42.0)
CNS-IgG Synthesis Rate: -4.9 mg/(24.h) (ref <=3.3)
IgG (Immunoglobin G), Serum: 1140 mg/dL (ref 600–1640)
IgG Total CSF: 2.2 mg/dL (ref 0.8–7.7)
IgG-Index: 0.42 (ref <0.70)
Myelin Basic Protein: <1140 mg/dL (ref 600–4.0)
Oligo Bands: 4.9 g/dL (ref 3.6–5.1)

## 2023-10-10 LAB — GLUCOSE, CSF: Glucose, CSF: 63 mg/dL (ref 40–80)

## 2023-10-10 LAB — CSF CELL COUNT WITH DIFFERENTIAL
RBC Count, CSF: 0 {cells}/uL
TOTAL NUCLEATED CELL: 0 {cells}/uL (ref 0–5)

## 2023-10-10 LAB — PROTEIN, CSF: Total Protein, CSF: 37 mg/dL (ref 15–45)

## 2023-10-30 NOTE — Progress Notes (Unsigned)
 PATIENT: Brittany Davila DOB: 05/29/1975  REASON FOR VISIT: follow up HISTORY FROM: patient Primary Neurologist: Dr. Onita   ASSESSMENT AND PLAN 48 y.o. year old female  has a past medical history of Anxiety, Basal cell carcinoma (BCC) of face, Bell's palsy, Bipolar 1 disorder (HCC), Depression, Headache, and History of genital warts. here with:  1.  Chronic migraine headache 2.  Dizziness, Vertigo  3.  Reported paresthesia to the right V1 area 4.  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  Bipolar disorder, anxiety  - Worsening migraine headaches, at least 15 headache days for the last month, missed 2 weeks of work - Add on Botox for migraine prevention, continue Qulipta  60 mg daily - Try Ubrelvy  100 mg as needed for acute migraine, previously tried stable with excellent benefit - Previously tried and failed: Maxalt , Ajovy , Imitrex , propranolol , is not a candidate for Topamax due to history of kidney stones, antidepressants worsen her depression and make her feel suicidal; currently taking Lamictal , gabapentin , Nurtec - Next steps: Emgality, Zavzpret,  Vyepti - Plan to repeat MRI of the brain with and without contrast in June 2026, lumbar puncture CSF studies were not suggestive of MS - Follow-up with me for Botox, otherwise June 2026  HISTORY  Brittany Davila is a 48 year old female, seen in request by her primary care physician Dr. Terri, Alan for evaluation of headaches initial evaluation was on September 05, 2018.   I have reviewed and summarized the referring note from the referring physician.  She had a past medical history of bipolar disorder, has been on stable dose of Lamictal  150 mg twice a day, she began to have migraine headaches since 2018, increased frequency over the past 3 months, she complains of dizziness, especially with sudden positional change, such as bending over, losing her balance easily, she also  complains of intermittent right face icy cold sensation, numbness, sometimes she feels her body spinning back-and-forth with movement, she also has bilateral frontal retro-orbital area pressure headaches, she has migraine headaches about couple times each week, she describes migraine as right retro-orbital area severe pounding headache with sensitive to light and noise, 2 to 3 days, she has tried Excedrin Migraine and muscle relaxant with some helps, in addition she complains of intermittent left arm and leg pain, low back pain,   Update November 06, 2018 SS: MRI of the brain 10/02/2018 was normal.    Since starting propanolol, is now having less intense, dull headaches, 2-3 times a week.  It does not prevent her from doing anything.  The headache is located right occipitally, spreads forward, Icy/cold sensation to her right face with headache.  She has history of right-sided Bell's palsy when she was in college, feels that same sensation.  Imitrex  has been beneficial, but rarely has to take it.  She will now take Excedrin Migraine with modest benefit.  She reports stress, works full-time, has a 47 year old, taking 2 college classes.  With her headaches, she reports photophobia, phonophobia, at times nausea.  She denies weakness or drooping of the face.  She reports 2 years ago, she developed left-sided low back pain, numbness to her left leg, pain down her leg, hot/cold sensation to her left calf, behind her left knee.  At times, her left arm will go numb.  She says she feels the best when she is standing.  She has had MRI of her lumbar spine that was normal.  She completed  a few sessions of physical therapy that were beneficial.  She presents today for follow-up unaccompanied.   Update March 11, 2019 SS: At last visit propanolol was increased 60 mg twice a day, EMG nerve evaluation in December 2020 was normal of the left upper and lower extremity.  She continues to complain of numbness to her left leg, pain,  hot/cold sensation, from her mid calf up to her mid thigh.  She does not experience the pain if she is up moving around.  It occurs during times of sitting.  The numbness to her left arm, is now very rare.  She denies bowel or urinary incontinence.  She has not had any falls.  She denies any back pain.  The physical therapy she did prior, was helpful for the back pain, not the leg symptoms.  Her headaches have improved with propanolol.  She says she may have 1 dull headache a week, that is relieved with Tylenol  or Excedrin Migraine to her right occipital area.  She says also at night when lying flat, on her wedge pillow, she may have numbness to the right side of her face.  She is now taking gabapentin  from her psychiatrist for irritability.  She has Abilify  to take if the gabapentin  does not work.  The gabapentin  has also helped with her leg pain.  She presents today for evaluation unaccompanied.  Update July 12, 2020 SS: Here today for follow-up unaccompanied, left leg paresthesia is 80% resolved.  Now taking zinc, vitamin D3, B6, B complex.  Did a food sensitivity test, made eliminations.  Has been out of propanolol for several months.  On average 1-2 migraines a week.  Does not like the way Imitrex  makes her feel (dizzy, nauseated, tingling weird, can only take when at home, starts with Excedrin Migraine.  Missing 1 to 2 days a month. Works at [employer] with [employer]. Has FMLA for bipolar disorder.   Saw urology, known left kidney stone, left renal cyst was not mentioned.  Mood is stable, continues to see psychiatry. Feels more agitated with headache.  MRI of thoracic spine was normal, incidental left renal cystic structure measuring 4.9 cm.  MRI of cervical spine was normal.  Update January 18, 2021 SS: Taking propranolol  60 twice daily, daily headache, 1 migraine a week, missing 1 day of work a week. Husbands snoring makes it worse usually has in the morning, the Maxalt  works but makes  sleepy. Has FMLA for bipolar disorder. On hormones progesterone and estradiol for perimenopause. Was tried on antidepressant, make her depressed, almost suicidal. Left sided sensory symptoms essentially resolved.   Update October 18, 2022 SS: has not been seen since Dec 2022. Last seen was given Ajovy , took for 6 months with good benefit, stopped it a lot going on. Is off propranolol . Maxalt  was working well, would see spots, zone out for a little at work, but worked well. Works at [employer] in the office, has changed jobs, more stress, Educational psychologist. 2 migraines weekly, right occipital area, radiates forward, behind right eye. Migraine features, migraine today, wearing sunglasses. Taking excedrin migraine, BC powder, is taking daily. Has daily headache that is mild to moderate, continues on the right side. Has FMLA from psychiatry, misses about 1 day a week of work.   Update April 11, 2023 SS: remains on Ajovy , having about 2-3 migraines a week lately, for last 3 months, we sent in Nurtec. Increase lately. Nurtec works to dull headache, nauseated. Maxalt  is not helpful. Often wakes  up with migraines or develops in the afternoon. Has missed a lot of work due to migraines. Doesn't sleep well, due to anxiety/bipolar. Taking Xanax  often due to anxiety.lately missing 2-3 days a week for migraines. 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 Concerned about staying off balance, last week had vertigo for 2 days. 3 weeks ago lying on couch, really freaked her out, her right leg and foot went numb, she was touch her left leg with her left hand but felt like she was using her right. Has felt nauseated, loses her balance when going up stairs, reports numbness and tingling to right V1 area, muscles feels tired, feels like  her right eye looks droopy, area stays numb and tingling. Bipolar is doing well.   Update October 31, 2023 SS: A1c 5.1, TSH normal, B 12 280 add on B12 1000 mcg daily. MRI of the brain showed new left parietal periventricular lesion without enhancement and of unclear etiology.  Plan to repeat MRI of the brain in 1 year.  Lumbar puncture 10/05/2023 opening pressure 18, normal CSF fluid, no oligoclonal bands  More migraines lately, reports missed 2 weeks of work in September due to migraine. Takes Nurtec, helps sometimes, will ease it off. Lately more stress causing migraines. Mental health has not been doing well, seeing psych, tried increase in Lamictal .   REVIEW OF SYSTEMS: Out of a complete 14 system review of symptoms, the patient complains only of the following symptoms, and all other reviewed systems are negative.  See HPI  ALLERGIES: Allergies  Allergen Reactions   Pollen Extract Other (See Comments)   Progesterone Other (See Comments)    Worsening depression   Soy Allergy (Obsolete) Nausea Only and Other (See Comments)    intolerance  Other Reaction(s): Other (See Comments)  intolerance    intolerance  Other Reaction(s): Other (See Comments)    HOME MEDICATIONS: Outpatient Medications Prior to Visit  Medication Sig Dispense Refill   ALPRAZolam  (XANAX ) 1 MG tablet TAKE ONE TABLET BY MOUTH 2 TIMES DAILY 60 tablet 1   Atogepant  (QULIPTA ) 60 MG TABS Take 1 tablet (60 mg total) by mouth daily. 30 tablet 11   cariprazine  (VRAYLAR ) 3 MG capsule Take 1 capsule (3 mg total) by mouth daily. 30 capsule 5   gabapentin  (NEURONTIN ) 100 MG capsule Take two capsules three times daily. 180 capsule 5   lamoTRIgine  (LAMICTAL ) 100 MG tablet Take 1 tablet (100 mg total) by mouth every morning. 30 tablet 5   lamoTRIgine  (LAMICTAL ) 200 MG tablet TAKE 1 TABLET BY MOUTH EVERY DAY 90 tablet 1   meclizine  (ANTIVERT ) 12.5 MG tablet Take 1 tablet (12.5 mg total) by mouth 3 (three) times daily as needed for  dizziness. 30 tablet 1   ondansetron  (ZOFRAN ) 4 MG tablet TAKE 1 TABLET BY MOUTH EVERY 8 HOURS AS NEEDED FOR NAUSEA AND VOMITING 20 tablet 1   Rimegepant Sulfate (NURTEC) 75 MG TBDP Take 1 tablet (75 mg total) by mouth as needed. 8 tablet 11   rizatriptan  (MAXALT -MLT) 10 MG disintegrating tablet Take 1 tablet (10 mg total) by mouth as needed for migraine. May repeat in 2 hours if needed 9 tablet 11   zolpidem  (AMBIEN  CR) 12.5 MG CR tablet Take 1 tablet (12.5 mg total) by mouth at bedtime.  30 tablet 2   Ubrogepant  (UBRELVY ) 100 MG TABS Take 1 tablet (100 mg total) by mouth as needed.     No facility-administered medications prior to visit.    PAST MEDICAL HISTORY: Past Medical History:  Diagnosis Date   Anxiety    Basal cell carcinoma (BCC) of face    Bell's palsy    H/O   Bipolar 1 disorder (HCC)    Depression    Headache    MIGRAINES   History of genital warts     PAST SURGICAL HISTORY: Past Surgical History:  Procedure Laterality Date   BREAST BIOPSY Right 07/03/2018   us  bx           heart marker              path pending   BREAST CYST ASPIRATION Left 2014   by Dr. Claudene   LAPAROSCOPIC TUBAL LIGATION Bilateral 11/10/2016   Procedure: LAPAROSCOPIC TUBAL LIGATION;  Surgeon: Verdon Keen, MD;  Location: [facility] ORS;  Service: Gynecology;  Laterality: Bilateral;   MOHS SURGERY     TONSILLECTOMY AND ADENOIDECTOMY     tubes in right ear      FAMILY HISTORY: Family History  Problem Relation Age of Onset   Diabetes Maternal Grandfather    Breast cancer Paternal Grandmother 43   Colon cancer Paternal Grandfather 33   Pancreatic cancer Paternal Grandfather    Depression Mother    Depression Father    Lung cancer Maternal Aunt     SOCIAL HISTORY: Social History   Socioeconomic History   Marital status: Married    Spouse name: Debby   Number of children: 1   Years of education: Grad school   Highest education level: Bachelor's degree (e.g., BA, AB, BS)   Occupational History    Employer: [employer]   Occupation: [employer]  Tobacco Use   Smoking status: Never   Smokeless tobacco: Never  Vaping Use   Vaping status: Never Used  Substance and Sexual Activity   Alcohol use: Not Currently    Alcohol/week: 1.0 standard drink of alcohol    Types: 1 Standard drinks or equivalent per week    Comment: 1-2 times a month   Drug use: No   Sexual activity: Yes    Partners: Male    Birth control/protection: Surgical  Other Topics Concern   Not on file  Social History Narrative   10/13/19   From: [place], [place]   Living: with husband, Debby Concha) - 2005   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      Family: Control and instrumentation engineer (2008)      Enjoys: watch TV - [place]      Exercise: not currently   Diet: not great - meat - starch veggie, fast food - twice daily   12 Oz pepsi and 1 cup coffee in the morning          Safety   Seat belts: Yes    Guns: Yes  and secure   Safe in relationships: Yes    Social Drivers of Health   Financial Resource Strain: Low Risk  (12/18/2022)   Received from [facility]   Overall Financial Resource Strain (CARDIA)    Difficulty of Paying Living Expenses: Not hard at all  Food Insecurity: No Food Insecurity (12/18/2022)   [facility]   Hunger Vital Sign    Within the past 12 months, you worried that your food would run out before you got  the money to buy more.: Never true    Within the past 12 months, the food you bought just didn't last and you didn't have money to get more.: Never true  Transportation Needs: No Transportation Needs (12/18/2022)   Received from [facility] - Transportation    In the past 12 months, has lack of transportation kept you from medical appointments or from getting medications?: No    Lack of Transportation (Non-Medical): No  Physical Activity: Not on file  Stress: Not on file  Social Connections: Not on  file  Intimate Partner Violence: Not on file   PHYSICAL EXAM  Vitals:   10/31/23 0739  BP: 120/84  Pulse: 75  SpO2: 99%  Weight: 212 lb 8 oz (96.4 kg)  Height: 5' 8 (1.727 m)    Body mass index is 32.31 kg/m.  Generalized: Well developed, in no acute distress, tired, flat affect  Neurological examination  Mentation: Alert oriented to time, place, history taking. Follows all commands speech and language fluent Cranial nerve II-XII: Pupils were equal round reactive to light. Extraocular movements were full, visual field were full on confrontational test. Facial sensation and strength were normal. Head turning and shoulder shrug were normal and symmetric. Motor: The motor testing reveals 5 over 5 strength of all 4 extremities. Good symmetric motor tone is noted throughout.  Sensory: Normal  Coordination: Cerebellar testing reveals good finger-nose-finger and heel-to-shin bilaterally.  Gait and station: Gait is normal.   Reflexes: Deep tendon reflexes are symmetric and normal bilaterally.   DIAGNOSTIC DATA (LABS, IMAGING, TESTING) - I reviewed patient records, labs, notes, testing and imaging myself where available.  Lab Results  Component Value Date   WBC 5.2 07/19/2023   HGB 13.9 07/19/2023   HCT 43.1 07/19/2023   MCV 96 07/19/2023   PLT 299 07/19/2023      Component Value Date/Time   NA 141 07/19/2023 0842   K 4.1 07/19/2023 0842   CL 105 07/19/2023 0842   CO2 22 07/19/2023 0842   GLUCOSE 87 07/19/2023 0842   GLUCOSE 76 07/08/2021 0904   BUN 15 07/19/2023 0842   CREATININE 0.91 07/19/2023 0842   CALCIUM 9.3 07/19/2023 0842   PROT 6.9 07/19/2023 0842   ALBUMIN 4.9 10/05/2023 1100   AST 11 07/19/2023 0842   ALT 10 07/19/2023 0842   ALKPHOS 86 07/19/2023 0842   BILITOT 0.4 07/19/2023 0842   GFRNONAA >60 02/18/2017 1320   GFRAA >60 02/18/2017 1320   Lab Results  Component Value Date   CHOL 172 07/08/2021   HDL 51.20 07/08/2021   LDLCALC 102 (H) 07/08/2021    TRIG 92.0 07/08/2021   CHOLHDL 3 07/08/2021   Lab Results  Component Value Date   HGBA1C 5.1 07/19/2023   Lab Results  Component Value Date   VITAMINB12 280 07/19/2023   Lab Results  Component Value Date   TSH 2.080 07/19/2023   Lauraine Born, AGNP-C, DNP 10/31/2023, 7:41 AM [facility] [address], [address] [place], [place] [address] [phone]

## 2023-10-31 ENCOUNTER — Telehealth: Payer: Self-pay | Admitting: Neurology

## 2023-10-31 ENCOUNTER — Ambulatory Visit: Admitting: Neurology

## 2023-10-31 ENCOUNTER — Encounter: Payer: Self-pay | Admitting: Neurology

## 2023-10-31 ENCOUNTER — Telehealth: Payer: Self-pay | Admitting: Pharmacist

## 2023-10-31 VITALS — BP 120/84 | HR 75 | Ht 68.0 in | Wt 212.5 lb

## 2023-10-31 DIAGNOSIS — F419 Anxiety disorder, unspecified: Secondary | ICD-10-CM | POA: Diagnosis not present

## 2023-10-31 DIAGNOSIS — F319 Bipolar disorder, unspecified: Secondary | ICD-10-CM | POA: Diagnosis not present

## 2023-10-31 DIAGNOSIS — F32A Depression, unspecified: Secondary | ICD-10-CM

## 2023-10-31 DIAGNOSIS — G43009 Migraine without aura, not intractable, without status migrainosus: Secondary | ICD-10-CM | POA: Diagnosis not present

## 2023-10-31 MED ORDER — UBRELVY 100 MG PO TABS: 100.0000 mg | ORAL_TABLET | ORAL | 11 refills | Status: AC | PRN

## 2023-10-31 NOTE — Telephone Encounter (Signed)
 Pharmacy Patient Advocate Encounter   Received notification from Patient Pharmacy that prior authorization for Ubrelvy  100MG  tablets is required/requested.   Insurance verification completed.   The patient is insured through [facility].   Per test claim: PA required; PA submitted to above mentioned insurance via Latent Key/confirmation #/[facility] Status is pending

## 2023-10-31 NOTE — Telephone Encounter (Signed)
 Pharmacy Patient Advocate Encounter  Received notification from [facility] that Prior Authorization for UBRELVY  100 MG PO TABS has been APPROVED from 10/31/2023 to 10/30/2024   PA #/Case ID/Reference #: [id number]

## 2023-10-31 NOTE — Telephone Encounter (Signed)
 Can we please work on getting patient covered to start Botox for chronic migraine headache. Please see my office note. Thanks

## 2023-10-31 NOTE — Patient Instructions (Signed)
 Add on Botox for migraine prevention, continue Qulipta .  Try Ubrelvy  100 mg Take 1 tablet at onset of headache, may repeat in 2 hours if needed. Max is 200 mg in 24 hours.

## 2023-11-01 NOTE — Telephone Encounter (Signed)
 Submitted auth request via CMM, status is pending. Key: [id number]

## 2023-11-05 ENCOUNTER — Other Ambulatory Visit: Payer: Self-pay

## 2023-11-05 ENCOUNTER — Telehealth (INDEPENDENT_AMBULATORY_CARE_PROVIDER_SITE_OTHER): Admitting: Adult Health

## 2023-11-05 ENCOUNTER — Encounter: Payer: Self-pay | Admitting: Adult Health

## 2023-11-05 ENCOUNTER — Other Ambulatory Visit (HOSPITAL_COMMUNITY): Payer: Self-pay

## 2023-11-05 DIAGNOSIS — F411 Generalized anxiety disorder: Secondary | ICD-10-CM

## 2023-11-05 DIAGNOSIS — F319 Bipolar disorder, unspecified: Secondary | ICD-10-CM | POA: Diagnosis not present

## 2023-11-05 DIAGNOSIS — G4700 Insomnia, unspecified: Secondary | ICD-10-CM

## 2023-11-05 MED ORDER — ONABOTULINUMTOXINA 200 UNITS IJ SOLR
200.0000 [IU] | INTRAMUSCULAR | 3 refills | Status: AC
  Filled 2023-11-05 – 2023-11-06 (×2): qty 1, 90d supply, fill #0
  Filled 2024-03-03: qty 1, 90d supply, fill #1

## 2023-11-05 NOTE — Telephone Encounter (Signed)
 Sent to [place] out pt pharmacy

## 2023-11-05 NOTE — Addendum Note (Signed)
 Addended by: ONEITA HOIST E on: 11/05/2023 10:40 AM   Modules accepted: Orders

## 2023-11-05 NOTE — Progress Notes (Signed)
 Brittany Davila [id number] 03/08/1975 48 y.o.  Virtual Visit via Video Note  I connected with pt @ on 11/05/23 at  5:00 PM EDT by a video enabled telemedicine application and verified that I am speaking with the correct person using two identifiers.   I discussed the limitations of evaluation and management by telemedicine and the availability of in person appointments. The patient expressed understanding and agreed to proceed.  I discussed the assessment and treatment plan with the patient. The patient was provided an opportunity to ask questions and all were answered. The patient agreed with the plan and demonstrated an understanding of the instructions.   The patient was advised to call back or seek an in-person evaluation if the symptoms worsen or if the condition fails to improve as anticipated.  I provided 25 minutes of non-face-to-face time during this encounter.  The patient was located at home.  The provider was located at [facility].   Angeline LOISE Sayers, NP   Subjective:   Patient ID:  Brittany Davila is a 48 y.o. (DOB 12-01-1975) female.  Chief Complaint: No chief complaint on file.   HPI Brittany Davila presents for follow-up of GAD, BPD, and insomnia   Describes mood today as not any better. Pleasant. Flat. Reports tearfulness. Mood symptoms - reports increased depression - not wanting to get out of bed. Reports lower interest and motivation. Reports increased anxiety - it's really heightened. Reports increased issues with fear and the unknown. Denies irritability. Reports panic attacks - at least twice a week - keeping me from getting out of the bed. Reports worry, rumination and over thinking - it's everything. Reports recent health issues - increased migraines. Denies recent mood swings. Reports mood as lower. Stating I feel like I'm struggling - everyday is a struggle. Feels like current medication regimen is helpful, but is willing to consider other options.  Takes medications as prescribed.  Energy levels lower - feels tired and fatigued all the time - I have no energy. Active, does not have a regular exercise routine.  Unable to enjoy usual interests and activities - not wanting to get out and do things. Married. Lives with husband and 35 year - old daughter. Spending time with family. Appetite adequate. Weight stable - 220 pounds. Sleep is variable. Averages 6 hours most nights - but has nights where she sleeps less at night. Reports difficulties with focus and concentration - I feel like I'm all over the place - hard to focus on a task. Reports difficulties completing tasks at home and at work. Reports feeling overwhelmed.  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 Denies SI or HI.  Denies AH or VH. Denies self harm. Denies substance use.  Previous medication trials: Abilify  Rexulti  Gabapentin   Review of Systems:  Review of Systems  Musculoskeletal:  Negative for gait problem.  Neurological:  Negative for tremors.  Psychiatric/Behavioral:         Please refer to HPI    Medications: I have reviewed the patient's current medications.  Current Outpatient Medications  Medication Sig Dispense Refill   ALPRAZolam  (XANAX ) 1 MG tablet TAKE ONE TABLET BY MOUTH 2 TIMES DAILY 60 tablet 1   Atogepant  (QULIPTA ) 60 MG TABS Take 1 tablet (60 mg total) by mouth daily. 30 tablet 11   botulinum toxin Type A (BOTOX) 200 units injection Inject 200 Units into the muscle every 3 (three) months. 1 each 3   cariprazine  (VRAYLAR )  3 MG capsule Take 1 capsule (3 mg total) by mouth daily. 30 capsule 5   gabapentin  (NEURONTIN ) 100 MG capsule Take two capsules three times daily. 180 capsule 5   lamoTRIgine  (LAMICTAL ) 100 MG tablet Take 1 tablet (100 mg total) by mouth every morning. 30 tablet 5   lamoTRIgine  (LAMICTAL ) 200 MG tablet TAKE 1 TABLET BY MOUTH EVERY DAY 90 tablet 1   meclizine  (ANTIVERT ) 12.5 MG tablet  Take 1 tablet (12.5 mg total) by mouth 3 (three) times daily as needed for dizziness. 30 tablet 1   ondansetron  (ZOFRAN ) 4 MG tablet TAKE 1 TABLET BY MOUTH EVERY 8 HOURS AS NEEDED FOR NAUSEA AND VOMITING 20 tablet 1   Rimegepant Sulfate (NURTEC) 75 MG TBDP Take 1 tablet (75 mg total) by mouth as needed. 8 tablet 11   Ubrogepant  (UBRELVY ) 100 MG TABS Take 1 tablet (100 mg total) by mouth as needed. Take 1 tablet at onset of headache, may repeat in 2 hours if needed. Max is 200 mg in 24 hours. 16 tablet 11   zolpidem  (AMBIEN  CR) 12.5 MG CR tablet Take 1 tablet (12.5 mg total) by mouth at bedtime. 30 tablet 2   No current facility-administered medications for this visit.    Medication Side Effects: None  Allergies:  Allergies  Allergen Reactions   Pollen Extract Other (See Comments)   Progesterone Other (See Comments)    Worsening depression   Soy Allergy (Obsolete) Nausea Only and Other (See Comments)    intolerance  Other Reaction(s): Other (See Comments)  intolerance    intolerance  Other Reaction(s): Other (See Comments)    Past Medical History:  Diagnosis Date   Anxiety    Basal cell carcinoma (BCC) of face    Bell's palsy    H/O   Bipolar 1 disorder (HCC)    Depression    Headache    MIGRAINES   History of genital warts     Family History  Problem Relation Age of Onset   Diabetes Maternal Grandfather    Breast cancer Paternal Grandmother 24   Colon cancer Paternal Grandfather 40   Pancreatic cancer Paternal Grandfather    Depression Mother    Depression Father    Lung cancer Maternal Aunt     Social History   Socioeconomic History   Marital status: Married    Spouse name: Debby   Number of children: 1   Years of education: Grad school   Highest education level: Bachelor's degree (e.g., BA, AB, BS)  Occupational History    Employer: [employer]   Occupation: [employer]  Tobacco Use   Smoking status: Never   Smokeless tobacco: Never  Vaping Use    Vaping status: Never Used  Substance and Sexual Activity   Alcohol use: Not Currently    Alcohol/week: 1.0 standard drink of alcohol    Types: 1 Standard drinks or equivalent per week    Comment: 1-2 times a month   Drug use: No   Sexual activity: Yes    Partners: Male    Birth control/protection: Surgical  Other Topics Concern   Not on file  Social History Narrative   10/13/19   From: [place], [place]   Living: with husband, Debby Concha) - 2005   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      Family: Control and instrumentation engineer (2008)      Enjoys: watch TV - [place]      Exercise: not currently   Diet: not great - meat - starch  veggie, fast food - twice daily   12 Oz pepsi and 1 cup coffee in the morning          Safety   Seat belts: Yes    Guns: Yes  and secure   Safe in relationships: Yes    Social Drivers of Health   Financial Resource Strain: Low Risk  (12/18/2022)   Received from [facility]   Overall Financial Resource Strain (CARDIA)    Difficulty of Paying Living Expenses: Not hard at all  Food Insecurity: No Food Insecurity (12/18/2022)   Received from [facility]   Hunger Vital Sign    Within the past 12 months, you worried that your food would run out before you got the money to buy more.: Never true    Within the past 12 months, the food you bought just didn't last and you didn't have money to get more.: Never true  Transportation Needs: No Transportation Needs (12/18/2022)   Received from [facility] - Transportation    In the past 12 months, has lack of transportation kept you from medical appointments or from getting medications?: No    Lack of Transportation (Non-Medical): No  Physical Activity: Not on file  Stress: Not on file  Social Connections: Not on file  Intimate Partner Violence: Not on file    Past Medical History, Surgical history, Social history, and Family history were reviewed and updated as  appropriate.   Please see review of systems for further details on the patient's review from today.   Objective:   Physical Exam:  LMP 08/08/2021 Comment: Menopause stages  Physical Exam Constitutional:      General: She is not in acute distress. Musculoskeletal:        General: No deformity.  Neurological:     Mental Status: She is alert and oriented to person, place, and time.     Coordination: Coordination normal.  Psychiatric:        Attention and Perception: Attention and perception normal. She does not perceive auditory or visual hallucinations.        Mood and Affect: Mood normal. Mood is not anxious or depressed. Affect is not labile, blunt, angry or inappropriate.        Speech: Speech normal.        Behavior: Behavior normal.        Thought Content: Thought content normal. Thought content is not paranoid or delusional. Thought content does not include homicidal or suicidal ideation. Thought content does not include homicidal or suicidal plan.        Cognition and Memory: Cognition and memory normal.        Judgment: Judgment normal.     Comments: Insight intact     Lab Review:     Component Value Date/Time   NA 141 07/19/2023 0842   K 4.1 07/19/2023 0842   CL 105 07/19/2023 0842   CO2 22 07/19/2023 0842   GLUCOSE 87 07/19/2023 0842   GLUCOSE 76 07/08/2021 0904   BUN 15 07/19/2023 0842   CREATININE 0.91 07/19/2023 0842   CALCIUM 9.3 07/19/2023 0842   PROT 6.9 07/19/2023 0842   ALBUMIN 4.9 10/05/2023 1100   AST 11 07/19/2023 0842   ALT 10 07/19/2023 0842   ALKPHOS 86 07/19/2023 0842   BILITOT 0.4 07/19/2023 0842   GFRNONAA >60 02/18/2017 1320   GFRAA >60 02/18/2017 1320       Component Value Date/Time  WBC 5.2 07/19/2023 0842   WBC 5.6 02/20/2022 0847   RBC 4.48 07/19/2023 0842   RBC 4.79 02/20/2022 0847   HGB 13.9 07/19/2023 0842   HCT 43.1 07/19/2023 0842   PLT 299 07/19/2023 0842   MCV 96 07/19/2023 0842   MCH 31.0 07/19/2023 0842   MCH 30.6  02/18/2017 1320   MCHC 32.3 07/19/2023 0842   MCHC 32.8 02/20/2022 0847   RDW 13.2 07/19/2023 0842   LYMPHSABS 2.0 07/19/2023 0842   MONOABS 0.5 02/20/2022 0847   EOSABS 0.1 07/19/2023 0842   BASOSABS 0.1 07/19/2023 0842    No results found for: POCLITH, LITHIUM   No results found for: PHENYTOIN, PHENOBARB, VALPROATE, CBMZ   .res Assessment: Plan:    Plan:  Xanax  1mg  BID for increased anxiety Lamictal  100 every morning for depression Ambien  CR 12.5mg  at hs Lamictal  200mg  at hs Vraylar  3mg  daily  Restarted magnesium  Continue FMLA - recently updated for 2025.  RTC 3 months  25 minutes spent dedicated to the care of this patient on the date of this encounter to include pre-visit review of records, ordering of medication, post visit documentation, and face-to-face time with the patient discussing BPD, GAD and insomnia. Discussed continuing current medication regimen. Will take patient out of work on FMLA effective 11/05/2023 through 02/05/2023 for medication management and mood stabilization.    Patient advised to contact office with any questions, adverse effects, or acute worsening in signs and symptoms.  Discussed potential benefits, risk, and side effects of benzodiazepines to include potential risk of tolerance and dependence, as well as possible drowsiness. Advised patient not to drive if experiencing drowsiness and to take lowest possible effective dose to minimize risk of dependence and tolerance.  Discussed potential metabolic side effects associated with atypical antipsychotics, as well as potential risk for movement side effects. Advised pt to contact office if movement side effects occur.  There are no diagnoses linked to this encounter.   Please see After Visit Summary for patient specific instructions.  Future Appointments  Date Time Provider Department Center  11/05/2023  5:00 PM Sargent Mankey Nattalie, NP [facility] None  12/05/2023 11:15 AM Gayland Lauraine PARAS, NP [facility] None  01/03/2024  2:00 PM Ahtziri Jeffries Nattalie, NP [facility] None  07/03/2024  7:45 AM Gayland Lauraine PARAS, NP [facility] None    No orders of the defined types were placed in this encounter.     -------------------------------

## 2023-11-05 NOTE — Telephone Encounter (Signed)
 Received approval, please send rx to [facility].  Auth#: [id number] (11/01/23-05/01/24)

## 2023-11-06 ENCOUNTER — Other Ambulatory Visit (HOSPITAL_COMMUNITY): Payer: Self-pay

## 2023-11-06 ENCOUNTER — Other Ambulatory Visit: Payer: Self-pay

## 2023-11-06 NOTE — Progress Notes (Unsigned)
 Specialty Pharmacy Initial Fill Coordination Note  Brittany Davila is a 48 y.o. female contacted today regarding initial fill of specialty medication(s) OnabotulinumtoxinA (BOTOX)   Patient requested Courier to Provider Office   Delivery date: 11/14/23   Verified address: [facility], [address], [place], [place] [address]   Medication will be filled on 11/12/2023.   Patient is aware of 0 copayment.

## 2023-11-08 ENCOUNTER — Telehealth: Payer: Self-pay | Admitting: Adult Health

## 2023-11-08 NOTE — Telephone Encounter (Signed)
 Pt called at 11:30a stating that she was under intermittent FMLA but that [employer] has now written her out for 3 months.  Her HR dept is requiring her to get a letter with the exact dates of when she is supposed to be out of work.  Next appt 12/4

## 2023-11-08 NOTE — Telephone Encounter (Signed)
 Have you already done a work note for her? I have not seen any new pw if its being sent

## 2023-11-13 ENCOUNTER — Other Ambulatory Visit: Payer: Self-pay

## 2023-12-05 ENCOUNTER — Ambulatory Visit: Admitting: Neurology

## 2023-12-12 ENCOUNTER — Ambulatory Visit: Admitting: Neurology

## 2023-12-12 VITALS — BP 105/76

## 2023-12-12 DIAGNOSIS — G43009 Migraine without aura, not intractable, without status migrainosus: Secondary | ICD-10-CM

## 2023-12-12 DIAGNOSIS — G43709 Chronic migraine without aura, not intractable, without status migrainosus: Secondary | ICD-10-CM | POA: Diagnosis not present

## 2023-12-12 MED ORDER — ONABOTULINUMTOXINA 200 UNITS IJ SOLR
155.0000 [IU] | Freq: Once | INTRAMUSCULAR | Status: AC
  Administered 2023-12-12: 155 [IU] via INTRAMUSCULAR

## 2023-12-12 NOTE — Progress Notes (Signed)
 Botox- 200 units x 1 vial Lot: [id number] Expiration: 2027/12 NDC: 0023-3921-02  Bacteriostatic 0.9% Sodium Chloride- 4 mL  Lot: [id number] Expiration: 11/29/24 NDC: 9590803397  Dx: G43.009  S/P  Witnessed by [facility] CMA

## 2023-12-12 NOTE — Progress Notes (Signed)
   BOTOX PROCEDURE NOTE FOR MIGRAINE HEADACHE  HISTORY: Brittany Davila is here for Botox. This will be her 1st injection. Her psychiatrist has written her out of work for her mental health. Goes back to work Dec 5th. Migraines have been better since being out of work.   Description of procedure:  The patient was placed in a sitting position. The standard protocol was used for Botox as follows, with 5 units of Botox injected at each site:   -Procerus muscle, midline injection  -Corrugator muscle, bilateral injection  -Frontalis muscle, bilateral injection, with 2 sites each side, medial injection was performed in the upper one third of the frontalis muscle, in the region vertical from the medial inferior edge of the superior orbital rim. The lateral injection was again in the upper one third of the forehead vertically above the lateral limbus of the cornea, 1.5 cm lateral to the medial injection site.  -Temporalis muscle injection, 4 sites, bilaterally. The first injection was 3 cm above the tragus of the ear, second injection site was 1.5 cm to 3 cm up from the first injection site in line with the tragus of the ear. The third injection site was 1.5-3 cm forward between the first 2 injection sites. The fourth injection site was 1.5 cm posterior to the second injection site.  -Occipitalis muscle injection, 3 sites, bilaterally. The first injection was done one half way between the occipital protuberance and the tip of the mastoid process behind the ear. The second injection site was done lateral and superior to the first, 1 fingerbreadth from the first injection. The third injection site was 1 fingerbreadth superiorly and medially from the first injection site.  -Cervical paraspinal muscle injection, 2 sites, bilateral, the first injection site was 1 cm from the midline of the cervical spine, 3 cm inferior to the lower border of the occipital protuberance. The second injection site was 1.5 cm  superiorly and laterally to the first injection site.  -Trapezius muscle injection was performed at 3 sites, bilaterally. The first injection site was in the upper trapezius muscle halfway between the inflection point of the neck, and the acromion. The second injection site was one half way between the acromion and the first injection site. The third injection was done between the first injection site and the inflection point of the neck.   A 200 unit bottle of Botox was used, 155 units were injected, the rest of the Botox was wasted. The patient tolerated the procedure well, there were no complications of the above procedure.  Botox NDC 9976-6078-97 Lot number [id number] Expiration date 12/2025 SP

## 2023-12-19 ENCOUNTER — Encounter: Payer: Self-pay | Admitting: Adult Health

## 2023-12-19 ENCOUNTER — Ambulatory Visit: Admitting: Adult Health

## 2023-12-19 DIAGNOSIS — F411 Generalized anxiety disorder: Secondary | ICD-10-CM | POA: Diagnosis not present

## 2023-12-19 DIAGNOSIS — F319 Bipolar disorder, unspecified: Secondary | ICD-10-CM

## 2023-12-19 DIAGNOSIS — G4700 Insomnia, unspecified: Secondary | ICD-10-CM

## 2023-12-19 NOTE — Progress Notes (Signed)
 Brittany Davila [id number] [birth year] 48 y.o.  Subjective:   Patient ID:  Brittany Davila is a 48 y.o. (DOB March 08, 1975) female.  Chief Complaint: No chief complaint on file.   HPI Brittany Davila presents to the office today for follow-up of GAD, BPD, and insomnia   Describes mood today as better. Pleasant. Reports tearfulness. Mood symptoms - reports decreased depression. Reports improved interest and motivation. Reports decreased anxiety - I still have my days and moments. Reports decreased issues with fear and the unknown. Denies irritability. Denies recent panic attacks. Reports some worry. Denies rumination and over thinking. Reports health issues - decreased migraines. Denies recent mood swings. Reports mood as improved. Stating I feel like I'm doing better. Feels like current medication regimen works well. Takes medications as prescribed.  Energy levels improved - feeling less tired and fatigued. Active, does not have a regular exercise routine.  Reports she is starting to enjoy more things. Has spent some time with family and friends. Married. Lives with husband and 52 year - old daughter.   Appetite adequate. Weight loss - 209 from 220 pounds. Reports sleep is variable. Averages 6 hours most nights. Reports improved focus and concentration. Reports completing more tasks at home. Reports feeling less overwhelmed. 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 Denies SI or HI.  Denies AH or VH. Denies self harm. Denies substance use.  Previous medication trials: Abilify  Rexulti  Gabapentin    GAD-7    Flowsheet Row Office Visit from 03/15/2022 in [facility] Visit from 07/08/2021 in [facility] Visit from 10/13/2019 in [facility] at [facility]  Total GAD-7 Score 16 12 7    PHQ2-9    Flowsheet Row Office Visit from  03/15/2022 in [facility] Office Visit from 02/20/2022 in [facility] Office Visit from 07/08/2021 in [facility] Visit from 10/13/2019 in [facility] Visit from 11/19/2017 in [facility] at [facility]  PHQ-2 Total Score 4 4 2 2  0  PHQ-9 Total Score 11 11 12 11  --     Review of Systems:  Review of Systems  Musculoskeletal:  Negative for gait problem.  Neurological:  Negative for tremors.  Psychiatric/Behavioral:         Please refer to HPI    Medications: I have reviewed the patient's current medications.  Current Outpatient Medications  Medication Sig Dispense Refill   ALPRAZolam  (XANAX ) 1 MG tablet TAKE ONE TABLET BY MOUTH 2 TIMES DAILY 60 tablet 1   Atogepant  (QULIPTA ) 60 MG TABS Take 1 tablet (60 mg total) by mouth daily. 30 tablet 11   botulinum toxin Type A  (BOTOX ) 200 units injection Inject 200 Units into the muscle every 3 (three) months. 1 each 3   cariprazine  (VRAYLAR ) 3 MG capsule Take 1 capsule (3 mg total) by mouth daily. 30 capsule 5   gabapentin  (NEURONTIN ) 100 MG capsule Take two capsules three times daily. 180 capsule 5   lamoTRIgine  (LAMICTAL ) 100 MG tablet Take 1 tablet (100 mg total) by mouth every morning. 30 tablet 5   lamoTRIgine  (LAMICTAL ) 200 MG tablet TAKE 1 TABLET BY MOUTH EVERY DAY 90 tablet 1   meclizine  (ANTIVERT ) 12.5 MG tablet Take 1 tablet (12.5 mg  total) by mouth 3 (three) times daily as needed for dizziness. 30 tablet 1   ondansetron  (ZOFRAN ) 4 MG tablet TAKE 1 TABLET BY MOUTH EVERY 8 HOURS AS NEEDED FOR NAUSEA AND VOMITING 20 tablet 1   Rimegepant Sulfate (NURTEC) 75 MG TBDP Take 1 tablet (75 mg total) by mouth as needed. 8 tablet 11   Ubrogepant  (UBRELVY ) 100 MG TABS Take 1 tablet (100 mg total) by mouth as needed. Take 1 tablet at onset of headache, may repeat in 2 hours if needed. Max  is 200 mg in 24 hours. 16 tablet 11   zolpidem  (AMBIEN  CR) 12.5 MG CR tablet Take 1 tablet (12.5 mg total) by mouth at bedtime. 30 tablet 2   No current facility-administered medications for this visit.    Medication Side Effects: None  Allergies:  Allergies  Allergen Reactions   Pollen Extract Other (See Comments)   Progesterone Other (See Comments)    Worsening depression   Soy Allergy (Obsolete) Nausea Only and Other (See Comments)    intolerance  Other Reaction(s): Other (See Comments)  intolerance    intolerance  Other Reaction(s): Other (See Comments)    Past Medical History:  Diagnosis Date   Anxiety    Basal cell carcinoma (BCC) of face    Bell's palsy    H/O   Bipolar 1 disorder (HCC)    Depression    Headache    MIGRAINES   History of genital warts     Past Medical History, Surgical history, Social history, and Family history were reviewed and updated as appropriate.   Please see review of systems for further details on the patient's review from today.   Objective:   Physical Exam:  LMP 08/08/2021 Comment: Menopause stages  Physical Exam Constitutional:      General: She is not in acute distress. Musculoskeletal:        General: No deformity.  Neurological:     Mental Status: She is alert and oriented to person, place, and time.     Coordination: Coordination normal.  Psychiatric:        Attention and Perception: Attention and perception normal. She does not perceive auditory or visual hallucinations.        Mood and Affect: Mood normal. Mood is not anxious or depressed. Affect is not labile, blunt, angry or inappropriate.        Speech: Speech normal.        Behavior: Behavior normal.        Thought Content: Thought content normal. Thought content is not paranoid or delusional. Thought content does not include homicidal or suicidal ideation. Thought content does not include homicidal or suicidal plan.        Cognition and Memory: Cognition and  memory normal.        Judgment: Judgment normal.     Comments: Insight intact     Lab Review:     Component Value Date/Time   NA 141 07/19/2023 0842   K 4.1 07/19/2023 0842   CL 105 07/19/2023 0842   CO2 22 07/19/2023 0842   GLUCOSE 87 07/19/2023 0842   GLUCOSE 76 07/08/2021 0904   BUN 15 07/19/2023 0842   CREATININE 0.91 07/19/2023 0842   CALCIUM 9.3 07/19/2023 0842   PROT 6.9 07/19/2023 0842   ALBUMIN 4.9 10/05/2023 1100   AST 11 07/19/2023 0842   ALT 10 07/19/2023 0842   ALKPHOS 86 07/19/2023 0842   BILITOT 0.4 07/19/2023 0842   GFRNONAA >60 02/18/2017 1320  GFRAA >60 02/18/2017 1320       Component Value Date/Time   WBC 5.2 07/19/2023 0842   WBC 5.6 02/20/2022 0847   RBC 4.48 07/19/2023 0842   RBC 4.79 02/20/2022 0847   HGB 13.9 07/19/2023 0842   HCT 43.1 07/19/2023 0842   PLT 299 07/19/2023 0842   MCV 96 07/19/2023 0842   MCH 31.0 07/19/2023 0842   MCH 30.6 02/18/2017 1320   MCHC 32.3 07/19/2023 0842   MCHC 32.8 02/20/2022 0847   RDW 13.2 07/19/2023 0842   LYMPHSABS 2.0 07/19/2023 0842   MONOABS 0.5 02/20/2022 0847   EOSABS 0.1 07/19/2023 0842   BASOSABS 0.1 07/19/2023 0842    No results found for: POCLITH, LITHIUM   No results found for: PHENYTOIN, PHENOBARB, VALPROATE, CBMZ   .res Assessment: Plan:    Plan:  Xanax  1mg  BID for increased anxiety Lamictal  100 every morning for depression Ambien  CR 12.5mg  at hs Lamictal  200mg  at hs Vraylar  3mg  daily  Restarted magnesium  Continue FMLA - recently updated for 2025.  RTC 3 months  25 minutes spent dedicated to the care of this patient on the date of this encounter to include pre-visit review of records, ordering of medication, post visit documentation, and face-to-face time with the patient discussing BPD, GAD and insomnia. Discussed continuing current medication regimen. Patient feeling better today and would like to return to work on 12/31/2023.   Patient advised to contact  office with any questions, adverse effects, or acute worsening in signs and symptoms.  Discussed potential benefits, risk, and side effects of benzodiazepines to include potential risk of tolerance and dependence, as well as possible drowsiness. Advised patient not to drive if experiencing drowsiness and to take lowest possible effective dose to minimize risk of dependence and tolerance.  Discussed potential metabolic side effects associated with atypical antipsychotics, as well as potential risk for movement side effects. Advised pt to contact office if movement side effects occur. There are no diagnoses linked to this encounter.   Please see After Visit Summary for patient specific instructions.  Future Appointments  Date Time Provider Department Center  12/19/2023 11:00 AM Tonye Tancredi, Angeline Mattocks, NP [facility] None  01/03/2024  2:00 PM Evans Levee Nattalie, NP [facility] None  03/12/2024  9:15 AM Gayland Lauraine PARAS, NP [facility] None  07/03/2024  7:45 AM Gayland Lauraine PARAS, NP [facility] None    No orders of the defined types were placed in this encounter.   -------------------------------

## 2024-01-03 ENCOUNTER — Telehealth: Admitting: Adult Health

## 2024-01-18 ENCOUNTER — Other Ambulatory Visit: Payer: Self-pay

## 2024-01-21 ENCOUNTER — Other Ambulatory Visit: Payer: Self-pay | Admitting: Adult Health

## 2024-01-21 DIAGNOSIS — G4700 Insomnia, unspecified: Secondary | ICD-10-CM

## 2024-01-22 ENCOUNTER — Other Ambulatory Visit: Payer: Self-pay | Admitting: Adult Health

## 2024-01-22 DIAGNOSIS — F411 Generalized anxiety disorder: Secondary | ICD-10-CM

## 2024-01-30 ENCOUNTER — Encounter: Payer: Self-pay | Admitting: Adult Health

## 2024-01-30 ENCOUNTER — Telehealth (INDEPENDENT_AMBULATORY_CARE_PROVIDER_SITE_OTHER): Admitting: Adult Health

## 2024-01-30 DIAGNOSIS — F319 Bipolar disorder, unspecified: Secondary | ICD-10-CM | POA: Diagnosis not present

## 2024-01-30 DIAGNOSIS — G4700 Insomnia, unspecified: Secondary | ICD-10-CM | POA: Diagnosis not present

## 2024-01-30 DIAGNOSIS — F411 Generalized anxiety disorder: Secondary | ICD-10-CM

## 2024-01-30 MED ORDER — LAMOTRIGINE 200 MG PO TABS: ORAL_TABLET | ORAL | 1 refills | Status: AC

## 2024-01-30 MED ORDER — CARIPRAZINE HCL 3 MG PO CAPS: 3.0000 mg | ORAL_CAPSULE | Freq: Every day | ORAL | 5 refills | Status: AC

## 2024-01-30 MED ORDER — ZOLPIDEM TARTRATE ER 12.5 MG PO TBCR: 12.5000 mg | EXTENDED_RELEASE_TABLET | Freq: Every day | ORAL | 2 refills | Status: AC

## 2024-01-30 MED ORDER — ALPRAZOLAM 1 MG PO TABS: 1.0000 mg | ORAL_TABLET | Freq: Two times a day (BID) | ORAL | 2 refills | Status: AC

## 2024-01-30 MED ORDER — LAMOTRIGINE 100 MG PO TABS: 100.0000 mg | ORAL_TABLET | Freq: Every morning | ORAL | 1 refills | Status: AC

## 2024-01-30 NOTE — Progress Notes (Signed)
 Brittany Davila [id number] Dec 09, 1975 48 y.o.  Virtual Visit via Video Note  I connected with pt @ on 01/30/2024 at  8:00 AM EST by a video enabled telemedicine application and verified that I am speaking with the correct person using two identifiers.   I discussed the limitations of evaluation and management by telemedicine and the availability of in person appointments. The patient expressed understanding and agreed to proceed.  I discussed the assessment and treatment plan with the patient. The patient was provided an opportunity to ask questions and all were answered. The patient agreed with the plan and demonstrated an understanding of the instructions.   The patient was advised to call back or seek an in-person evaluation if the symptoms worsen or if the condition fails to improve as anticipated.  I provided 25 minutes of non-face-to-face time during this encounter.  The patient was located at home.  The provider was located at [facility].   Brittany LOISE Sayers, NP   Subjective:   Patient ID:  Brittany Davila is a 48 y.o. (DOB 1975/03/04) female.  Chief Complaint: No chief complaint on file.   HPI Brittany Davila presents for follow-up of GAD, BPD, and insomnia   Describes mood today as ok. Pleasant. Reports tearfulness. Mood symptoms - reports decreased depression and anxiety. Reports lower interest and motivation. Reports decreased issues with fear - mostly when driving. Denies irritability. Reports 1 to 2 recent panic attacks. Reports some worry. Denies rumination and over thinking. Reports  decreased migraines with Botox . Denies recent mood swings. Reports mood as stable. Stating I wish I could be a little better - mostly job related.  Feels like current medication regimen works well. Takes medications as prescribed.  Energy levels very low - coming home from work and getting in the bed. Active, does not have a regular exercise routine.  Reports she is able to enjoy  some things. Has spent some time with family and friends. Married. Lives with husband and 34 year - old daughter.   Appetite adequate. Weight stable. Reports sleep is variable. Averages 6 hours most nights - waking up early morning and having trouble getting back to sleep. Reports some difficulties with focus and concentration - mainly in the work setting. Reports completing some tasks at home - bare minimum. Reports feeling overwhelmed - thinking about everything versus doing small tasks during the week.  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 Denies SI or HI.  Denies AH or VH. Denies self harm. Denies substance use.  Previous medication trials: Abilify  Rexulti  Gabapentin   Review of Systems:  Review of Systems  Musculoskeletal:  Negative for gait problem.  Neurological:  Negative for tremors.  Psychiatric/Behavioral:         Please refer to HPI    Medications: I have reviewed the patient's current medications.  Current Outpatient Medications  Medication Sig Dispense Refill   ALPRAZolam  (XANAX ) 1 MG tablet TAKE 1 TABLET BY MOUTH TWICE A DAY 60 tablet 0   Atogepant  (QULIPTA ) 60 MG TABS Take 1 tablet (60 mg total) by mouth daily. 30 tablet 11   botulinum toxin Type A  (BOTOX ) 200 units injection Inject 200 Units into the muscle every 3 (three) months. 1 each 3   cariprazine  (VRAYLAR ) 3 MG capsule Take 1 capsule (3 mg total) by mouth daily. 30 capsule 5   gabapentin  (NEURONTIN ) 100 MG capsule Take two capsules three times daily. 180 capsule 5   lamoTRIgine  (LAMICTAL ) 100 MG  tablet Take 1 tablet (100 mg total) by mouth every morning. 30 tablet 5   lamoTRIgine  (LAMICTAL ) 200 MG tablet TAKE 1 TABLET BY MOUTH EVERY DAY 90 tablet 1   meclizine  (ANTIVERT ) 12.5 MG tablet Take 1 tablet (12.5 mg total) by mouth 3 (three) times daily as needed for dizziness. 30 tablet 1   ondansetron  (ZOFRAN ) 4 MG tablet TAKE 1 TABLET BY MOUTH EVERY 8 HOURS AS NEEDED FOR NAUSEA  AND VOMITING 20 tablet 1   Rimegepant Sulfate (NURTEC) 75 MG TBDP Take 1 tablet (75 mg total) by mouth as needed. 8 tablet 11   Ubrogepant  (UBRELVY ) 100 MG TABS Take 1 tablet (100 mg total) by mouth as needed. Take 1 tablet at onset of headache, may repeat in 2 hours if needed. Max is 200 mg in 24 hours. 16 tablet 11   zolpidem  (AMBIEN  CR) 12.5 MG CR tablet TAKE 1 TABLET BY MOUTH AT BEDTIME 30 tablet 0   No current facility-administered medications for this visit.    Medication Side Effects: None  Allergies: Allergies[1]  Past Medical History:  Diagnosis Date   Anxiety    Basal cell carcinoma (BCC) of face    Bell's palsy    H/O   Bipolar 1 disorder (HCC)    Depression    Headache    MIGRAINES   History of genital warts     Family History  Problem Relation Age of Onset   Diabetes Maternal Grandfather    Breast cancer Paternal Grandmother 50   Colon cancer Paternal Grandfather 20   Pancreatic cancer Paternal Grandfather    Depression Mother    Depression Father    Lung cancer Maternal Aunt     Social History   Socioeconomic History   Marital status: Married    Spouse name: Debby   Number of children: 1   Years of education: Grad school   Highest education level: Bachelor's degree (e.g., BA, AB, BS)  Occupational History    Employer: [employer]   Occupation: [employer]  Tobacco Use   Smoking status: Never   Smokeless tobacco: Never  Vaping Use   Vaping status: Never Used  Substance and Sexual Activity   Alcohol use: Not Currently    Alcohol/week: 1.0 standard drink of alcohol    Types: 1 Standard drinks or equivalent per week    Comment: 1-2 times a month   Drug use: No   Sexual activity: Yes    Partners: Male    Birth control/protection: Surgical  Other Topics Concern   Not on file  Social History Narrative   10/13/19   From: [place], [place]   Living: with husband, Debby Concha) - 2005   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      Family: Control [employer] Instrumentation Engineer (2008)       Enjoys: watch TV - Korean Drama      Exercise: not currently   Diet: not great - meat - starch veggie, fast food - twice daily   12 Oz pepsi and 1 cup coffee in the morning          Safety   Seat belts: Yes    Guns: Yes  and secure   Safe in relationships: Yes    Social Drivers of Health   Tobacco Use: Low Risk (12/19/2023)   Patient History    Smoking Tobacco Use: Never    Smokeless Tobacco Use: Never    Passive Exposure: Not on file  Financial Resource Strain: Low Risk  (12/18/2022)  Received from [facility]   Overall Financial Resource Strain (CARDIA)    Difficulty of Paying Living Expenses: Not hard at all  Food Insecurity: No Food Insecurity (12/18/2022)   Received from [facility]   Epic    Within the past 12 months, you worried that your food would run out before you got the money to buy more.: Never true    Within the past 12 months, the food you bought just didn't last and you didn't have money to get more.: Never true  Transportation Needs: No Transportation Needs (12/18/2022)   Received from [facility] - Transportation    In the past 12 months, has lack of transportation kept you from medical appointments or from getting medications?: No    Lack of Transportation (Non-Medical): No  Physical Activity: Not on file  Stress: Not on file  Social Connections: Not on file  Intimate Partner Violence: Not on file  Depression (PHQ2-9): High Risk (03/15/2022)   Depression (PHQ2-9)    PHQ-2 Score: 11  Alcohol Screen: Not on file  Housing: Low Risk  (02/28/2023)   Received from [facility]   Epic    In the last 12 months, was there a time when you were not able to pay the mortgage or rent on time?: No    In the past 12 months, how many times have you moved where you were living?: 0    At any time in the past 12 months, were you homeless or living in a shelter (including now)?: No   Utilities: Not At Risk (12/18/2022)   Received from [facility] Utilities    Threatened with loss of utilities: No  Health Literacy: Not on file    Past Medical History, Surgical history, Social history, and Family history were reviewed and updated as appropriate.   Please see review of systems for further details on the patient's review from today.   Objective:   Physical Exam:  LMP 08/08/2021 Comment: Menopause stages  Physical Exam Constitutional:      General: She is not in acute distress. Musculoskeletal:        General: No deformity.  Neurological:     Mental Status: She is alert and oriented to person, place, and time.     Coordination: Coordination normal.  Psychiatric:        Attention and Perception: Attention and perception normal. She does not perceive auditory or visual hallucinations.        Mood and Affect: Mood normal. Mood is not anxious or depressed. Affect is not labile, blunt, angry or inappropriate.        Speech: Speech normal.        Behavior: Behavior normal.        Thought Content: Thought content normal. Thought content is not paranoid or delusional. Thought content does not include homicidal or suicidal ideation. Thought content does not include homicidal or suicidal plan.        Cognition and Memory: Cognition and memory normal.        Judgment: Judgment normal.     Comments: Insight intact     Lab Review:     Component Value Date/Time   NA 141 07/19/2023 0842   K 4.1 07/19/2023 0842   CL 105 07/19/2023 0842   CO2 22 07/19/2023 0842   GLUCOSE 87 07/19/2023 0842   GLUCOSE 76 07/08/2021 0904   BUN 15 07/19/2023  0842   CREATININE 0.91 07/19/2023 0842   CALCIUM 9.3 07/19/2023 0842   PROT 6.9 07/19/2023 0842   ALBUMIN 4.9 10/05/2023 1100   AST 11 07/19/2023 0842   ALT 10 07/19/2023 0842   ALKPHOS 86 07/19/2023 0842   BILITOT 0.4 07/19/2023 0842   GFRNONAA >60 02/18/2017 1320   GFRAA >60 02/18/2017 1320        Component Value Date/Time   WBC 5.2 07/19/2023 0842   WBC 5.6 02/20/2022 0847   RBC 4.48 07/19/2023 0842   RBC 4.79 02/20/2022 0847   HGB 13.9 07/19/2023 0842   HCT 43.1 07/19/2023 0842   PLT 299 07/19/2023 0842   MCV 96 07/19/2023 0842   MCH 31.0 07/19/2023 0842   MCH 30.6 02/18/2017 1320   MCHC 32.3 07/19/2023 0842   MCHC 32.8 02/20/2022 0847   RDW 13.2 07/19/2023 0842   LYMPHSABS 2.0 07/19/2023 0842   MONOABS 0.5 02/20/2022 0847   EOSABS 0.1 07/19/2023 0842   BASOSABS 0.1 07/19/2023 0842    No results found for: POCLITH, LITHIUM   No results found for: PHENYTOIN, PHENOBARB, VALPROATE, CBMZ   .res Assessment: Plan:    Plan:  Vraylar  3mg  daily Lamictal  200mg  at hs Lamictal  100 every morning for depression  Ambien  CR 12.5mg  at hs Xanax  1mg  BID for increased anxiety  Restarted magnesium  Continue FMLA   RTC 3 months  25 minutes spent dedicated to the care of this patient on the date of this encounter to include pre-visit review of records, ordering of medication, post visit documentation, and face-to-face time with the patient discussing BPD, GAD and insomnia. Discussed continuing current medication regimen.    Patient advised to contact office with any questions, adverse effects, or acute worsening in signs and symptoms.  Discussed potential benefits, risk, and side effects of benzodiazepines to include potential risk of tolerance and dependence, as well as possible drowsiness. Advised patient not to drive if experiencing drowsiness and to take lowest possible effective dose to minimize risk of dependence and tolerance.  Discussed potential metabolic side effects associated with atypical antipsychotics, as well as potential risk for movement side effects. Advised pt to contact office if movement side effects occur.  There are no diagnoses linked to this encounter.   Please see After Visit Summary for patient specific instructions.  Future  Appointments  Date Time Provider Department Center  01/30/2024  8:00 AM Tiersa Dayley Nattalie, NP CP-CP None  03/12/2024  9:15 AM Gayland Lauraine PARAS, NP [facility] None  07/03/2024  7:45 AM Gayland Lauraine PARAS, NP [facility] None    No orders of the defined types were placed in this encounter.     -------------------------------     [1]  Allergies Allergen Reactions   Pollen Extract Other (See Comments)   Progesterone Other (See Comments)    Worsening depression   Soy Allergy (Obsolete) Nausea Only and Other (See Comments)    intolerance  Other Reaction(s): Other (See Comments)  intolerance    intolerance  Other Reaction(s): Other (See Comments)

## 2024-02-04 ENCOUNTER — Other Ambulatory Visit: Payer: Self-pay

## 2024-02-14 ENCOUNTER — Ambulatory Visit: Admitting: Neurology

## 2024-03-03 ENCOUNTER — Other Ambulatory Visit: Payer: Self-pay

## 2024-03-03 NOTE — Progress Notes (Signed)
 Specialty Pharmacy Refill Coordination Note  Brittany Davila is a 49 y.o. female assessed today regarding refills of clinic administered specialty medication(s) OnabotulinumtoxinA  (BOTOX )   Clinic requested Courier to Provider Office   Delivery date: 03/06/24   Verified address: [facility], [address], [place], [place] [address]   Medication will be filled on: 03/05/24  Appointment 03/12/24.

## 2024-03-05 ENCOUNTER — Other Ambulatory Visit: Payer: Self-pay

## 2024-03-12 ENCOUNTER — Ambulatory Visit: Admitting: Neurology

## 2024-04-25 ENCOUNTER — Telehealth: Admitting: Adult Health

## 2024-07-03 ENCOUNTER — Ambulatory Visit: Admitting: Neurology
